# Patient Record
Sex: Male | Born: 1975 | Race: White | Hispanic: No | Smoking: Current every day smoker
Health system: Southern US, Community
[De-identification: ages and names within clinical notes are randomized; demographics above are authoritative.]

## PROBLEM LIST (undated history)

## (undated) VITALS — BP 128/89 | HR 77 | Temp 97.6°F | Resp 18 | Ht 72.0 in | Wt 194.0 lb

## (undated) DIAGNOSIS — B1920 Unspecified viral hepatitis C without hepatic coma: Secondary | ICD-10-CM

## (undated) DIAGNOSIS — R69 Illness, unspecified: Secondary | ICD-10-CM

## (undated) DIAGNOSIS — IMO0002 Reserved for concepts with insufficient information to code with codable children: Secondary | ICD-10-CM

## (undated) DIAGNOSIS — M549 Dorsalgia, unspecified: Secondary | ICD-10-CM

## (undated) DIAGNOSIS — M51379 Other intervertebral disc degeneration, lumbosacral region without mention of lumbar back pain or lower extremity pain: Secondary | ICD-10-CM

## (undated) DIAGNOSIS — I10 Essential (primary) hypertension: Secondary | ICD-10-CM

## (undated) DIAGNOSIS — K589 Irritable bowel syndrome without diarrhea: Secondary | ICD-10-CM

## (undated) DIAGNOSIS — G8929 Other chronic pain: Secondary | ICD-10-CM

## (undated) DIAGNOSIS — K5010 Crohn's disease of large intestine without complications: Secondary | ICD-10-CM

## (undated) DIAGNOSIS — F1910 Other psychoactive substance abuse, uncomplicated: Secondary | ICD-10-CM

## (undated) DIAGNOSIS — F419 Anxiety disorder, unspecified: Secondary | ICD-10-CM

## (undated) DIAGNOSIS — F32A Depression, unspecified: Secondary | ICD-10-CM

## (undated) DIAGNOSIS — F329 Major depressive disorder, single episode, unspecified: Secondary | ICD-10-CM

## (undated) DIAGNOSIS — M5137 Other intervertebral disc degeneration, lumbosacral region: Secondary | ICD-10-CM

## (undated) HISTORY — DX: Anxiety disorder, unspecified: F41.9

## (undated) HISTORY — PX: OTHER SURGICAL HISTORY: SHX169

## (undated) HISTORY — PX: FEMUR FRACTURE SURGERY: SHX633

## (undated) HISTORY — DX: Illness, unspecified: R69

## (undated) HISTORY — PX: ORIF PELVIC FRACTURE: SHX2128

## (undated) HISTORY — PX: KNEE SURGERY: SHX244

---

## 2009-02-03 ENCOUNTER — Emergency Department (HOSPITAL_COMMUNITY): Admission: EM | Admit: 2009-02-03 | Discharge: 2009-02-03 | Payer: Self-pay | Admitting: Emergency Medicine

## 2009-02-20 ENCOUNTER — Emergency Department (HOSPITAL_COMMUNITY): Admission: EM | Admit: 2009-02-20 | Discharge: 2009-02-20 | Payer: Self-pay | Admitting: Emergency Medicine

## 2009-03-25 ENCOUNTER — Emergency Department (HOSPITAL_COMMUNITY): Admission: EM | Admit: 2009-03-25 | Discharge: 2009-03-25 | Payer: Self-pay | Admitting: Emergency Medicine

## 2009-11-24 ENCOUNTER — Encounter
Admission: RE | Admit: 2009-11-24 | Discharge: 2009-11-24 | Payer: Self-pay | Admitting: Physical Medicine & Rehabilitation

## 2010-02-07 ENCOUNTER — Emergency Department (HOSPITAL_COMMUNITY): Admission: EM | Admit: 2010-02-07 | Discharge: 2010-02-07 | Payer: Self-pay | Admitting: Emergency Medicine

## 2010-02-14 ENCOUNTER — Emergency Department (HOSPITAL_COMMUNITY): Admission: EM | Admit: 2010-02-14 | Discharge: 2010-02-14 | Payer: Self-pay | Admitting: Emergency Medicine

## 2010-02-14 ENCOUNTER — Encounter: Payer: Self-pay | Admitting: Orthopedic Surgery

## 2010-02-19 ENCOUNTER — Ambulatory Visit: Payer: Self-pay | Admitting: Orthopedic Surgery

## 2010-02-19 DIAGNOSIS — S40019A Contusion of unspecified shoulder, initial encounter: Secondary | ICD-10-CM

## 2010-02-19 DIAGNOSIS — E119 Type 2 diabetes mellitus without complications: Secondary | ICD-10-CM

## 2010-02-19 DIAGNOSIS — B1710 Acute hepatitis C without hepatic coma: Secondary | ICD-10-CM | POA: Insufficient documentation

## 2010-02-19 DIAGNOSIS — IMO0002 Reserved for concepts with insufficient information to code with codable children: Secondary | ICD-10-CM | POA: Insufficient documentation

## 2010-03-21 ENCOUNTER — Encounter (HOSPITAL_COMMUNITY): Admission: RE | Admit: 2010-03-21 | Discharge: 2010-04-20 | Payer: Self-pay | Admitting: Orthopaedic Surgery

## 2010-03-30 ENCOUNTER — Ambulatory Visit (HOSPITAL_COMMUNITY): Admission: RE | Admit: 2010-03-30 | Discharge: 2010-03-30 | Payer: Self-pay | Admitting: Orthopaedic Surgery

## 2010-05-25 ENCOUNTER — Encounter (HOSPITAL_COMMUNITY)
Admission: RE | Admit: 2010-05-25 | Discharge: 2010-06-24 | Payer: Self-pay | Source: Home / Self Care | Admitting: Orthopaedic Surgery

## 2010-07-19 ENCOUNTER — Emergency Department (HOSPITAL_COMMUNITY)
Admission: EM | Admit: 2010-07-19 | Discharge: 2010-07-19 | Payer: Self-pay | Source: Home / Self Care | Admitting: Emergency Medicine

## 2010-07-31 ENCOUNTER — Encounter (HOSPITAL_COMMUNITY): Admission: RE | Admit: 2010-07-31 | Payer: Self-pay | Source: Home / Self Care | Admitting: Orthopaedic Surgery

## 2010-08-30 NOTE — Letter (Signed)
Summary: History form  History form   Imported By: Jacklynn Ganong 02/21/2010 12:15:18  _____________________________________________________________________  External Attachment:    Type:   Image     Comment:   External Document

## 2010-08-30 NOTE — Assessment & Plan Note (Signed)
Summary: [facility] 02/14/10 LEFT SHOULDER SPRAIN/XRE THERE/MEDICAID/BSF   Visit Type:  new patient Referring Provider:  [facility]  CC:  left shoulder pain.  History of Present Illness: I saw Matthew [name] in the office today for an initial visit.  He is a 35 years old man with the complaint of:  left shoulder pain.  This 35 year old male says he fell off a roof and landed on his LEFT shoulder and skinned his RIGHT knee.  He says he was injured on the 20th when he looked at the ER notes it was on the 19th.  He went to the emergency room the next day.  He has since gone to his primary care doctor for referral here because he is on Medicaid.  He was given a Demerol shot with some Phenergan and told to take 1-2 Percocet for pain he is in a sling.  He complained of sharp throbbing burning constant pain which is 7/10 and associated with the injury.  He says he can  he makes him wake up.  He has no bruising complains of tingling locking and swelling and abrasion over the RIGHT knee.  He says that it started to hurt quite a bit.    DOI 02-13-10.  [facility] on 02-14-10. Xrays were taken of his left shoulder. Normal.  Medications: Lantus, Humalog, Bentyl, Naprosyn 500mg , Norco 5mg .  Allergies (verified): No Known Drug Allergies  Past History:  Past Medical History: Diabetes Hepatitis C  Past Surgical History: Left knee Right knee  Review of Systems Constitutional:  Denies weight loss, weight gain, fever, chills, and fatigue. Cardiovascular:  Complains of chest pain; denies palpitations, fainting, and murmurs. Respiratory:  Denies short of breath, wheezing, couch, tightness, pain on inspiration, and snoring . Gastrointestinal:  Denies heartburn, nausea, vomiting, diarrhea, constipation, and blood in your stools. Genitourinary:  Denies frequency, urgency, difficulty urinating, painful urination, flank pain, and bleeding in urine. Neurologic:  Complains of tingling; denies numbness, unsteady gait,  dizziness, tremors, and seizure. Musculoskeletal:  Complains of joint pain, swelling, stiffness, and muscle pain; denies instability, redness, and heat. Endocrine:  Denies excessive thirst, exessive urination, and heat or cold intolerance. Psychiatric:  Denies nervousness, depression, anxiety, and hallucinations. Skin:  Denies changes in the skin, poor healing, rash, itching, and redness. HEENT:  Denies blurred or double vision, eye pain, redness, and watering. Immunology:  Denies seasonal allergies, sinus problems, and allergic to bee stings. Hemoatologic:  Denies easy bleeding and brusing.  Physical Exam  Msk:  The patient is well developed and nourished, with normal grooming and hygiene. The body habitus is  medium frame Pulses:  pulses are normal RIGHT leg LEFT arm Extremities:  RIGHT knee abrasion over the anterior part of the knee.  Range of motion is full no effusion normal muscle tone knee is stable  LEFT shoulder he complains of tenderness over the a.c. joint and over the anterolateral acromion.  There is no bruising ecchymosis or swelling around the shoulder.  His elbow wrist and hand have normal range of motion muscle tone and stability.  He has painful passive range of motion of the LEFT shoulder Neurologic:  normal sensation was noted he was awake alert and oriented x3 mood and affect were normal Skin:  operation RIGHT knee  LEFT shoulder normal Cervical Nodes:  no significant adenopathy Psych:  alert and cooperative; normal mood and affect; normal attention span and concentration   Impression & Recommendations:  Problem # 1:  CONTUSION OF SHOULDER REGION (ICD-923.00) Assessment  New  x-rays LEFT shoulder normal  Orders: New Patient Level III (04540)  Problem # 2:  ABRASION, KNEE, RIGHT (ICD-916.0) Assessment: New  Orders: New Patient Level III (98119)  Patient Instructions: 1)  Wear sling for 2 weeks  2)  take percocet until it runs out then take advil as  needed  3)  f/u as needed  4)  Diagnosis:  Contuison left shoulder xrays show no fracture

## 2010-08-30 NOTE — Letter (Signed)
Summary: *Orthopedic Consult Note  [facility]  [address] [place] [address]  [place], [place] [address]   Phone: [phone]  Fax: [phone]    Re:    Matthew [name] DOB:    [birth year]   Dear: Peyton Najjar   Thank you for requesting that we see the above patient for consultation.  A copy of the detailed office note will be sent under separate cover, for your review.  Evaluation today is consistent with:  1)  HEPATITIS C (ICD-070.51) 2)  DIABETES (ICD-250.00) 3) contusion LEFT shoulderand abrasion RIGHT knee   Our recommendation is for:   sling for 2 weeks, Percocet until it runs out he told me he gave him 60.  After that he can take over-the-counter medications.  We could not find a fracture I did not see any swelling bruising and ecchymosis around his LEFT shoulder.  No followup scheduled.       Thank you for this opportunity to look after your patient.  Sincerely,   Terrance Mass. MD.

## 2010-10-09 ENCOUNTER — Emergency Department (HOSPITAL_COMMUNITY): Payer: Medicaid Other

## 2010-10-09 ENCOUNTER — Emergency Department (HOSPITAL_COMMUNITY)
Admission: EM | Admit: 2010-10-09 | Discharge: 2010-10-09 | Disposition: A | Discharge status: Home / Self Care | Payer: Medicaid Other | Attending: Emergency Medicine | Admitting: Emergency Medicine

## 2010-10-09 DIAGNOSIS — S20219A Contusion of unspecified front wall of thorax, initial encounter: Secondary | ICD-10-CM | POA: Insufficient documentation

## 2010-10-09 DIAGNOSIS — M25569 Pain in unspecified knee: Secondary | ICD-10-CM | POA: Insufficient documentation

## 2010-10-09 DIAGNOSIS — Z79899 Other long term (current) drug therapy: Secondary | ICD-10-CM | POA: Insufficient documentation

## 2010-10-09 DIAGNOSIS — R51 Headache: Secondary | ICD-10-CM | POA: Insufficient documentation

## 2010-10-09 DIAGNOSIS — E119 Type 2 diabetes mellitus without complications: Secondary | ICD-10-CM | POA: Insufficient documentation

## 2010-10-09 DIAGNOSIS — R071 Chest pain on breathing: Secondary | ICD-10-CM | POA: Insufficient documentation

## 2010-10-09 DIAGNOSIS — W010XXA Fall on same level from slipping, tripping and stumbling without subsequent striking against object, initial encounter: Secondary | ICD-10-CM | POA: Insufficient documentation

## 2010-10-09 DIAGNOSIS — R0609 Other forms of dyspnea: Secondary | ICD-10-CM | POA: Insufficient documentation

## 2010-10-09 DIAGNOSIS — Y92009 Unspecified place in unspecified non-institutional (private) residence as the place of occurrence of the external cause: Secondary | ICD-10-CM | POA: Insufficient documentation

## 2010-10-09 DIAGNOSIS — I10 Essential (primary) hypertension: Secondary | ICD-10-CM | POA: Insufficient documentation

## 2010-10-09 DIAGNOSIS — M25469 Effusion, unspecified knee: Secondary | ICD-10-CM | POA: Insufficient documentation

## 2010-10-09 DIAGNOSIS — R0989 Other specified symptoms and signs involving the circulatory and respiratory systems: Secondary | ICD-10-CM | POA: Insufficient documentation

## 2010-10-09 DIAGNOSIS — S0990XA Unspecified injury of head, initial encounter: Secondary | ICD-10-CM | POA: Insufficient documentation

## 2010-10-09 DIAGNOSIS — Z794 Long term (current) use of insulin: Secondary | ICD-10-CM | POA: Insufficient documentation

## 2010-10-09 DIAGNOSIS — IMO0002 Reserved for concepts with insufficient information to code with codable children: Secondary | ICD-10-CM | POA: Insufficient documentation

## 2010-10-14 LAB — GLUCOSE, CAPILLARY

## 2010-10-17 ENCOUNTER — Emergency Department (HOSPITAL_COMMUNITY): Payer: Medicaid Other

## 2010-10-17 ENCOUNTER — Emergency Department (HOSPITAL_COMMUNITY)
Admission: EM | Admit: 2010-10-17 | Discharge: 2010-10-17 | Disposition: A | Discharge status: Home / Self Care | Payer: Medicaid Other | Attending: Emergency Medicine | Admitting: Emergency Medicine

## 2010-10-17 DIAGNOSIS — R071 Chest pain on breathing: Secondary | ICD-10-CM | POA: Insufficient documentation

## 2010-10-17 DIAGNOSIS — K589 Irritable bowel syndrome without diarrhea: Secondary | ICD-10-CM | POA: Insufficient documentation

## 2010-10-17 DIAGNOSIS — E119 Type 2 diabetes mellitus without complications: Secondary | ICD-10-CM | POA: Insufficient documentation

## 2010-10-17 DIAGNOSIS — I339 Acute and subacute endocarditis, unspecified: Secondary | ICD-10-CM | POA: Insufficient documentation

## 2010-10-17 LAB — CBC
Hemoglobin: 16.8 g/dL (ref 13.0–17.0)
MCV: 84.7 fL (ref 78.0–100.0)
Platelets: 139 10*3/uL — ABNORMAL LOW (ref 150–400)
RBC: 5.62 MIL/uL (ref 4.22–5.81)
WBC: 10.9 10*3/uL — ABNORMAL HIGH (ref 4.0–10.5)

## 2010-10-17 LAB — DIFFERENTIAL
Eosinophils Absolute: 0.1 10*3/uL (ref 0.0–0.7)
Lymphocytes Relative: 25 % (ref 12–46)
Lymphs Abs: 2.7 10*3/uL (ref 0.7–4.0)
Monocytes Relative: 6 % (ref 3–12)
Neutro Abs: 7.4 10*3/uL (ref 1.7–7.7)
Neutrophils Relative %: 68 % (ref 43–77)

## 2010-10-17 LAB — BASIC METABOLIC PANEL
BUN: 15 mg/dL (ref 6–23)
CO2: 28 mEq/L (ref 19–32)
Chloride: 102 mEq/L (ref 96–112)
Potassium: 3.5 mEq/L (ref 3.5–5.1)

## 2010-10-17 LAB — D-DIMER, QUANTITATIVE: D-Dimer, Quant: >20 ug/mL-FEU — ABNORMAL HIGH (ref 0.00–0.48)

## 2010-10-17 MED ORDER — IOHEXOL 350 MG/ML SOLN
100.0000 mL | Freq: Once | INTRAVENOUS | Status: AC | PRN
  Administered 2010-10-17: 100 mL via INTRAVENOUS

## 2010-11-03 LAB — URINALYSIS, ROUTINE W REFLEX MICROSCOPIC
Hgb urine dipstick: NEGATIVE
Nitrite: NEGATIVE
Specific Gravity, Urine: 1.045 — ABNORMAL HIGH (ref 1.005–1.030)
Urobilinogen, UA: 0.2 mg/dL (ref 0.0–1.0)
pH: 6.5 (ref 5.0–8.0)

## 2010-11-03 LAB — URINE MICROSCOPIC-ADD ON

## 2010-11-03 LAB — GLUCOSE, CAPILLARY: Glucose-Capillary: 423 mg/dL — ABNORMAL HIGH (ref 70–99)

## 2010-11-04 LAB — CBC
MCHC: 33.9 g/dL (ref 30.0–36.0)
MCV: 89.5 fL (ref 78.0–100.0)
Platelets: 167 10*3/uL (ref 150–400)
RDW: 13.5 % (ref 11.5–15.5)

## 2010-11-04 LAB — RAPID URINE DRUG SCREEN, HOSP PERFORMED
Barbiturates: NOT DETECTED
Opiates: POSITIVE — AB
Tetrahydrocannabinol: NOT DETECTED

## 2010-11-04 LAB — BASIC METABOLIC PANEL
BUN: 9 mg/dL (ref 6–23)
CO2: 31 mEq/L (ref 19–32)
Chloride: 102 mEq/L (ref 96–112)
Creatinine, Ser: 0.82 mg/dL (ref 0.4–1.5)

## 2010-11-04 LAB — DIFFERENTIAL
Basophils Relative: 1 % (ref 0–1)
Eosinophils Absolute: 0.1 10*3/uL (ref 0.0–0.7)
Neutrophils Relative %: 64 % (ref 43–77)

## 2010-11-23 ENCOUNTER — Other Ambulatory Visit (HOSPITAL_COMMUNITY): Payer: Self-pay | Admitting: Family Medicine

## 2010-11-23 ENCOUNTER — Encounter (HOSPITAL_COMMUNITY): Payer: Self-pay

## 2010-11-23 ENCOUNTER — Ambulatory Visit (HOSPITAL_COMMUNITY)
Admission: RE | Admit: 2010-11-23 | Discharge: 2010-11-23 | Disposition: A | Discharge status: Home / Self Care | Payer: Medicaid Other | Source: Ambulatory Visit | Attending: Family Medicine | Admitting: Family Medicine

## 2010-11-23 DIAGNOSIS — R079 Chest pain, unspecified: Secondary | ICD-10-CM

## 2010-11-23 DIAGNOSIS — R0789 Other chest pain: Secondary | ICD-10-CM | POA: Insufficient documentation

## 2010-11-23 HISTORY — DX: Essential (primary) hypertension: I10

## 2011-01-03 ENCOUNTER — Ambulatory Visit (HOSPITAL_COMMUNITY): Payer: Medicaid Other | Admitting: Psychology

## 2011-01-16 ENCOUNTER — Ambulatory Visit (INDEPENDENT_AMBULATORY_CARE_PROVIDER_SITE_OTHER): Payer: Medicaid Other | Admitting: Psychology

## 2011-01-16 DIAGNOSIS — F319 Bipolar disorder, unspecified: Secondary | ICD-10-CM

## 2011-01-16 DIAGNOSIS — F29 Unspecified psychosis not due to a substance or known physiological condition: Secondary | ICD-10-CM

## 2011-01-18 ENCOUNTER — Ambulatory Visit (INDEPENDENT_AMBULATORY_CARE_PROVIDER_SITE_OTHER): Payer: Medicaid Other | Admitting: Psychiatry

## 2011-01-18 DIAGNOSIS — F3289 Other specified depressive episodes: Secondary | ICD-10-CM

## 2011-01-23 ENCOUNTER — Encounter (INDEPENDENT_AMBULATORY_CARE_PROVIDER_SITE_OTHER): Payer: Medicaid Other | Admitting: Psychology

## 2011-01-23 DIAGNOSIS — F29 Unspecified psychosis not due to a substance or known physiological condition: Secondary | ICD-10-CM

## 2011-01-23 DIAGNOSIS — F319 Bipolar disorder, unspecified: Secondary | ICD-10-CM

## 2011-02-14 ENCOUNTER — Encounter (HOSPITAL_COMMUNITY): Payer: Medicaid Other | Admitting: Psychology

## 2011-02-15 ENCOUNTER — Encounter (HOSPITAL_COMMUNITY): Payer: Medicaid Other | Admitting: Psychiatry

## 2011-03-11 ENCOUNTER — Encounter (HOSPITAL_COMMUNITY): Payer: Medicaid Other | Admitting: Psychology

## 2011-05-10 ENCOUNTER — Encounter (INDEPENDENT_AMBULATORY_CARE_PROVIDER_SITE_OTHER): Payer: Medicaid Other | Admitting: Psychiatry

## 2011-05-10 DIAGNOSIS — F3289 Other specified depressive episodes: Secondary | ICD-10-CM

## 2011-05-17 ENCOUNTER — Encounter (HOSPITAL_COMMUNITY): Payer: Medicaid Other | Admitting: Psychology

## 2011-07-03 ENCOUNTER — Other Ambulatory Visit (HOSPITAL_COMMUNITY): Payer: Self-pay | Admitting: Internal Medicine

## 2011-07-03 DIAGNOSIS — M549 Dorsalgia, unspecified: Secondary | ICD-10-CM

## 2011-07-05 ENCOUNTER — Encounter (HOSPITAL_COMMUNITY): Payer: Medicaid Other | Admitting: Psychiatry

## 2011-07-05 ENCOUNTER — Ambulatory Visit (HOSPITAL_COMMUNITY)
Admission: RE | Admit: 2011-07-05 | Discharge: 2011-07-05 | Disposition: A | Discharge status: Home / Self Care | Payer: Medicaid Other | Source: Ambulatory Visit | Attending: Internal Medicine | Admitting: Internal Medicine

## 2011-07-05 ENCOUNTER — Ambulatory Visit (HOSPITAL_COMMUNITY): Payer: Medicaid Other | Admitting: Psychiatry

## 2011-07-05 DIAGNOSIS — M51379 Other intervertebral disc degeneration, lumbosacral region without mention of lumbar back pain or lower extremity pain: Secondary | ICD-10-CM | POA: Insufficient documentation

## 2011-07-05 DIAGNOSIS — M549 Dorsalgia, unspecified: Secondary | ICD-10-CM

## 2011-07-05 DIAGNOSIS — M545 Low back pain, unspecified: Secondary | ICD-10-CM | POA: Insufficient documentation

## 2011-07-05 DIAGNOSIS — M5137 Other intervertebral disc degeneration, lumbosacral region: Secondary | ICD-10-CM | POA: Insufficient documentation

## 2011-07-05 DIAGNOSIS — M5126 Other intervertebral disc displacement, lumbar region: Secondary | ICD-10-CM | POA: Insufficient documentation

## 2011-07-12 ENCOUNTER — Ambulatory Visit (HOSPITAL_COMMUNITY): Payer: Medicaid Other | Admitting: Psychology

## 2011-08-02 ENCOUNTER — Ambulatory Visit (INDEPENDENT_AMBULATORY_CARE_PROVIDER_SITE_OTHER): Payer: Medicaid Other | Admitting: Psychiatry

## 2011-08-02 ENCOUNTER — Encounter (HOSPITAL_COMMUNITY): Payer: Self-pay | Admitting: Psychiatry

## 2011-08-02 DIAGNOSIS — F3160 Bipolar disorder, current episode mixed, unspecified: Secondary | ICD-10-CM

## 2011-08-02 MED ORDER — ALPRAZOLAM 1 MG PO TABS: 1.0000 mg | ORAL_TABLET | Freq: Every evening | ORAL | Status: DC | PRN

## 2011-08-02 MED ORDER — QUETIAPINE FUMARATE ER 400 MG PO TB24: 400.0000 mg | ORAL_TABLET | Freq: Every day | ORAL | Status: DC

## 2011-08-02 NOTE — Progress Notes (Signed)
Patient ID: Matthew [name], male   DOB: 1975/12/09, 36 y.o.   MRN: [id number] Patient reports increasing anxiety and panic attacks that were treated in prison with remeron and xanax.  Not sleeping well.  Affect anxious.  Using tylox for back pain.  Diabetes controlled with occasional use of insulin.  Weight 20 lb. In last month.

## 2011-08-02 NOTE — Progress Notes (Signed)
Addended by: Toni Arthurs, Christoher Drudge L on: 08/02/2011 12:13 PM   Modules accepted: Orders

## 2011-08-15 ENCOUNTER — Ambulatory Visit (INDEPENDENT_AMBULATORY_CARE_PROVIDER_SITE_OTHER): Payer: Medicaid Other | Admitting: Psychology

## 2011-08-15 ENCOUNTER — Ambulatory Visit (HOSPITAL_COMMUNITY): Payer: Medicaid Other | Admitting: Psychology

## 2011-08-15 DIAGNOSIS — F3160 Bipolar disorder, current episode mixed, unspecified: Secondary | ICD-10-CM

## 2011-08-16 ENCOUNTER — Encounter (HOSPITAL_COMMUNITY): Payer: Self-pay | Admitting: Psychology

## 2011-08-16 ENCOUNTER — Other Ambulatory Visit (HOSPITAL_COMMUNITY): Payer: Self-pay | Admitting: *Deleted

## 2011-08-16 DIAGNOSIS — F3160 Bipolar disorder, current episode mixed, unspecified: Secondary | ICD-10-CM
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
## 2011-08-23 MED ORDER — QUETIAPINE FUMARATE ER 400 MG PO TB24: 400.0000 mg | ORAL_TABLET | Freq: Every day | ORAL | Status: DC

## 2011-08-30 ENCOUNTER — Ambulatory Visit (HOSPITAL_COMMUNITY): Payer: Medicaid Other | Admitting: Psychology

## 2011-09-13 ENCOUNTER — Encounter (HOSPITAL_COMMUNITY): Payer: Self-pay | Admitting: Psychiatry

## 2011-09-13 ENCOUNTER — Ambulatory Visit (INDEPENDENT_AMBULATORY_CARE_PROVIDER_SITE_OTHER): Payer: Medicaid Other | Admitting: Psychiatry

## 2011-09-13 ENCOUNTER — Ambulatory Visit (HOSPITAL_COMMUNITY): Payer: Medicaid Other | Admitting: Psychiatry

## 2011-09-13 DIAGNOSIS — F3160 Bipolar disorder, current episode mixed, unspecified: Secondary | ICD-10-CM

## 2011-09-13 MED ORDER — DIVALPROEX SODIUM 250 MG PO DR TAB: 250.0000 mg | DELAYED_RELEASE_TABLET | Freq: Three times a day (TID) | ORAL | Status: DC

## 2011-09-13 NOTE — Progress Notes (Signed)
Patient ID: Matthew [name], male   DOB: [birth year], 36 y.o.   MRN: [id number] Reports having panic attacks in crowds.  Did this in prison as well.  Staying up for days on end despite Seroquel 400.  Mind racing and more impulsive.  Goes outside to relieve anxiety and paranoia.  Some irritability.

## 2011-10-11 ENCOUNTER — Ambulatory Visit (HOSPITAL_COMMUNITY): Payer: Medicaid Other | Admitting: Psychiatry

## 2011-10-29 ENCOUNTER — Other Ambulatory Visit (HOSPITAL_COMMUNITY): Payer: Self-pay | Admitting: *Deleted

## 2011-10-29 DIAGNOSIS — F3160 Bipolar disorder, current episode mixed, unspecified: Secondary | ICD-10-CM

## 2011-11-08 ENCOUNTER — Telehealth (HOSPITAL_COMMUNITY): Payer: Self-pay | Admitting: *Deleted

## 2011-11-08 ENCOUNTER — Encounter (HOSPITAL_COMMUNITY): Payer: Self-pay | Admitting: Psychiatry

## 2011-11-08 ENCOUNTER — Ambulatory Visit (INDEPENDENT_AMBULATORY_CARE_PROVIDER_SITE_OTHER): Payer: Medicaid Other | Admitting: Psychiatry

## 2011-11-08 DIAGNOSIS — F3160 Bipolar disorder, current episode mixed, unspecified: Secondary | ICD-10-CM

## 2011-11-08 MED ORDER — ALPRAZOLAM 1 MG PO TABS: 1.0000 mg | ORAL_TABLET | Freq: Two times a day (BID) | ORAL | Status: DC
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
## 2011-12-06 ENCOUNTER — Ambulatory Visit (INDEPENDENT_AMBULATORY_CARE_PROVIDER_SITE_OTHER): Payer: Medicaid Other | Admitting: Psychiatry

## 2011-12-06 ENCOUNTER — Encounter (HOSPITAL_COMMUNITY): Payer: Self-pay | Admitting: Psychiatry

## 2011-12-06 DIAGNOSIS — F319 Bipolar disorder, unspecified: Secondary | ICD-10-CM

## 2011-12-06 NOTE — Progress Notes (Signed)
Patient ID: Matthew [name], male   DOB: [birth year], 36 y.o.   MRN: [id number] Same stuff.  Still dealing with loss of mother and girlfriend.  Has a new pit bull puppy that helps keep me busy.  Still down and depressed when alone.  Able to tolerate this all better.  Temper better as in contrast to prison persona where he was anxious and jittery.  Fought a lot there.  Handles temper better now.  Not arrested in 3 years since release.  Doing yard work and stays by himself.

## 2011-12-31 ENCOUNTER — Encounter (HOSPITAL_COMMUNITY): Payer: Self-pay

## 2011-12-31 ENCOUNTER — Telehealth (HOSPITAL_COMMUNITY): Payer: Self-pay | Admitting: *Deleted

## 2011-12-31 ENCOUNTER — Emergency Department (HOSPITAL_COMMUNITY)
Admission: EM | Admit: 2011-12-31 | Discharge: 2011-12-31 | Disposition: A | Discharge status: Home / Self Care | Payer: Medicaid Other | Attending: Emergency Medicine | Admitting: Emergency Medicine

## 2011-12-31 DIAGNOSIS — S51809A Unspecified open wound of unspecified forearm, initial encounter: Secondary | ICD-10-CM | POA: Insufficient documentation

## 2011-12-31 DIAGNOSIS — X789XXA Intentional self-harm by unspecified sharp object, initial encounter: Secondary | ICD-10-CM | POA: Insufficient documentation

## 2011-12-31 DIAGNOSIS — Y92009 Unspecified place in unspecified non-institutional (private) residence as the place of occurrence of the external cause: Secondary | ICD-10-CM | POA: Insufficient documentation

## 2011-12-31 DIAGNOSIS — F17200 Nicotine dependence, unspecified, uncomplicated: Secondary | ICD-10-CM | POA: Insufficient documentation

## 2011-12-31 DIAGNOSIS — I10 Essential (primary) hypertension: Secondary | ICD-10-CM | POA: Insufficient documentation

## 2011-12-31 DIAGNOSIS — E119 Type 2 diabetes mellitus without complications: Secondary | ICD-10-CM | POA: Insufficient documentation

## 2011-12-31 NOTE — Discharge Instructions (Signed)
The steri strips will peel off on their own. If you continue to feel anxious and dpressed, follow up with Len Blalock and Dr. Kieth Brightly. Laceration Care, Adult A laceration is a cut or lesion that goes through all layers of the skin and into the tissue just beneath the skin. TREATMENT  Some lacerations may not require closure. Some lacerations may not be able to be closed due to an increased risk of infection. It is important to see your caregiver as soon as possible after an injury to minimize the risk of infection and maximize the opportunity for successful closure. If closure is appropriate, pain medicines may be given, if needed. The wound will be cleaned to help prevent infection. Your caregiver will use stitches (sutures), staples, wound glue (adhesive), or skin adhesive strips to repair the laceration. These tools bring the skin edges together to allow for faster healing and a better cosmetic outcome. However, all wounds will heal with a scar. Once the wound has healed, scarring can be minimized by covering the wound with sunscreen during the day for 1 full year. HOME CARE INSTRUCTIONS  For sutures or staples:  Keep the wound clean and dry.   If you were given a bandage (dressing), you should change it at least once a day. Also, change the dressing if it becomes wet or dirty, or as directed by your caregiver.   Wash the wound with soap and water 2 times a day. Rinse the wound off with water to remove all soap. Pat the wound dry with a clean towel.   After cleaning, apply a thin layer of the antibiotic ointment as recommended by your caregiver. This will help prevent infection and keep the dressing from sticking.   You may shower as usual after the first 24 hours. Do not soak the wound in water until the sutures are removed.   Only take over-the-counter or prescription medicines for pain, discomfort, or fever as directed by your caregiver.   Get your sutures or staples removed as  directed by your caregiver.  For skin adhesive strips:  Keep the wound clean and dry.   Do not get the skin adhesive strips wet. You may bathe carefully, using caution to keep the wound dry.   If the wound gets wet, pat it dry with a clean towel.   Skin adhesive strips will fall off on their own. You may trim the strips as the wound heals. Do not remove skin adhesive strips that are still stuck to the wound. They will fall off in time.  For wound adhesive:  You may briefly wet your wound in the shower or bath. Do not soak or scrub the wound. Do not swim. Avoid periods of heavy perspiration until the skin adhesive has fallen off on its own. After showering or bathing, gently pat the wound dry with a clean towel.   Do not apply liquid medicine, cream medicine, or ointment medicine to your wound while the skin adhesive is in place. This may loosen the film before your wound is healed.   If a dressing is placed over the wound, be careful not to apply tape directly over the skin adhesive. This may cause the adhesive to be pulled off before the wound is healed.   Avoid prolonged exposure to sunlight or tanning lamps while the skin adhesive is in place. Exposure to ultraviolet light in the first year will darken the scar.   The skin adhesive will usually remain in place for 5 to  10 days, then naturally fall off the skin. Do not pick at the adhesive film.  You may need a tetanus shot if:  You cannot remember when you had your last tetanus shot.   You have never had a tetanus shot.  If you get a tetanus shot, your arm may swell, get red, and feel warm to the touch. This is common and not a problem. If you need a tetanus shot and you choose not to have one, there is a rare chance of getting tetanus. Sickness from tetanus can be serious. SEEK MEDICAL CARE IF:   You have redness, swelling, or increasing pain in the wound.   You see a red line that goes away from the wound.   You have  yellowish-white fluid (pus) coming from the wound.   You have a fever.   You notice a bad smell coming from the wound or dressing.   Your wound breaks open before or after sutures have been removed.   You notice something coming out of the wound such as wood or glass.   Your wound is on your hand or foot and you cannot move a finger or toe.  SEEK IMMEDIATE MEDICAL CARE IF:   Your pain is not controlled with prescribed medicine.   You have severe swelling around the wound causing pain and numbness or a change in color in your arm, hand, leg, or foot.   Your wound splits open and starts bleeding.   You have worsening numbness, weakness, or loss of function of any joint around or beyond the wound.   You develop painful lumps near the wound or on the skin anywhere on your body.  MAKE SURE YOU:   Understand these instructions.   Will watch your condition.   Will get help right away if you are not doing well or get worse.  Document Released: 07/15/2005 Document Revised: 07/04/2011 Document Reviewed: 01/08/2011 [facility] Patient Information 2012 [place], [place].

## 2011-12-31 NOTE — ED Notes (Signed)
Was working on something and cut my leg forearm with a knife per pt. Happened about 1 hour ago per pt. Bleeding controlled at present time. Patient has 3 marks on his forearm that he states came from an old 3 blade knife while cutting carbon.
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
## 2012-01-17 ENCOUNTER — Ambulatory Visit (HOSPITAL_COMMUNITY): Payer: Self-pay | Admitting: Psychiatry

## 2012-01-31 ENCOUNTER — Ambulatory Visit (INDEPENDENT_AMBULATORY_CARE_PROVIDER_SITE_OTHER): Payer: Medicaid Other | Admitting: Psychiatry

## 2012-01-31 DIAGNOSIS — F319 Bipolar disorder, unspecified: Secondary | ICD-10-CM

## 2012-01-31 NOTE — Progress Notes (Signed)
Patient ID: Matthew [name], male   DOB: 21-Feb-1976, 36 y.o.   MRN: [id number] Called after feeling suicidal and trying to get himself admitted through er, but was refused admission.  Recommended he go to hospital directly, but he did not go; said he could not get a ride there.  Not suicidal now but still having anxiety attacks.  Feels more depressed now.

## 2012-02-06 ENCOUNTER — Other Ambulatory Visit (HOSPITAL_COMMUNITY): Payer: Self-pay | Admitting: *Deleted

## 2012-02-06 DIAGNOSIS — F3160 Bipolar disorder, current episode mixed, unspecified: Secondary | ICD-10-CM

## 2012-02-13 ENCOUNTER — Other Ambulatory Visit (HOSPITAL_COMMUNITY): Payer: Self-pay | Admitting: Internal Medicine

## 2012-02-13 DIAGNOSIS — R7402 Elevation of levels of lactic acid dehydrogenase (LDH): Secondary | ICD-10-CM

## 2012-02-25 ENCOUNTER — Other Ambulatory Visit (HOSPITAL_COMMUNITY): Payer: Self-pay | Admitting: *Deleted

## 2012-02-25 DIAGNOSIS — F3160 Bipolar disorder, current episode mixed, unspecified: Secondary | ICD-10-CM

## 2012-02-28 ENCOUNTER — Ambulatory Visit (HOSPITAL_COMMUNITY)
Admission: RE | Admit: 2012-02-28 | Discharge: 2012-02-28 | Disposition: A | Discharge status: Home / Self Care | Payer: Medicaid Other | Source: Ambulatory Visit | Attending: Internal Medicine | Admitting: Internal Medicine

## 2012-02-28 ENCOUNTER — Ambulatory Visit (HOSPITAL_COMMUNITY): Payer: Medicaid Other

## 2012-02-28 DIAGNOSIS — I10 Essential (primary) hypertension: Secondary | ICD-10-CM | POA: Insufficient documentation

## 2012-02-28 DIAGNOSIS — R7401 Elevation of levels of liver transaminase levels: Secondary | ICD-10-CM | POA: Insufficient documentation

## 2012-02-28 DIAGNOSIS — E119 Type 2 diabetes mellitus without complications: Secondary | ICD-10-CM | POA: Insufficient documentation

## 2012-02-28 DIAGNOSIS — R7402 Elevation of levels of lactic acid dehydrogenase (LDH): Secondary | ICD-10-CM | POA: Insufficient documentation

## 2012-02-28 MED ORDER — ALPRAZOLAM 1 MG PO TABS: 1.0000 mg | ORAL_TABLET | Freq: Three times a day (TID) | ORAL | Status: DC

## 2012-02-28 MED ORDER — DIVALPROEX SODIUM 250 MG PO DR TAB: 250.0000 mg | DELAYED_RELEASE_TABLET | Freq: Three times a day (TID) | ORAL | Status: DC

## 2012-03-05 ENCOUNTER — Other Ambulatory Visit (HOSPITAL_COMMUNITY): Payer: Self-pay | Admitting: Internal Medicine

## 2012-03-05 DIAGNOSIS — R1013 Epigastric pain: Secondary | ICD-10-CM

## 2012-03-09 ENCOUNTER — Encounter (HOSPITAL_COMMUNITY): Payer: Self-pay

## 2012-03-09 ENCOUNTER — Encounter (HOSPITAL_COMMUNITY)
Admission: RE | Admit: 2012-03-09 | Discharge: 2012-03-09 | Disposition: A | Discharge status: Home / Self Care | Payer: Medicaid Other | Source: Ambulatory Visit | Attending: Internal Medicine | Admitting: Internal Medicine

## 2012-03-09 DIAGNOSIS — R109 Unspecified abdominal pain: Secondary | ICD-10-CM | POA: Insufficient documentation

## 2012-03-09 DIAGNOSIS — R1013 Epigastric pain: Secondary | ICD-10-CM

## 2012-03-09 MED ORDER — TECHNETIUM TC 99M MEBROFENIN IV KIT
5.0000 | PACK | Freq: Once | INTRAVENOUS | Status: AC | PRN
  Administered 2012-03-09: 5 via INTRAVENOUS

## 2012-03-13 ENCOUNTER — Ambulatory Visit (INDEPENDENT_AMBULATORY_CARE_PROVIDER_SITE_OTHER): Payer: Medicaid Other | Admitting: Psychiatry

## 2012-03-13 ENCOUNTER — Encounter (INDEPENDENT_AMBULATORY_CARE_PROVIDER_SITE_OTHER): Payer: Self-pay | Admitting: *Deleted

## 2012-03-13 ENCOUNTER — Encounter (HOSPITAL_COMMUNITY): Payer: Self-pay | Admitting: Psychiatry

## 2012-03-13 DIAGNOSIS — F319 Bipolar disorder, unspecified: Secondary | ICD-10-CM

## 2012-03-13 NOTE — Progress Notes (Signed)
Patient ID: Matthew [name], male   DOB: [birth year], 36 y.o.   MRN: [id number] Staying on meds regularly.  Mood about same.  Says he is still depressed but not as many panic attacks.  Not started zoloft yet because of money.  Now working some to paint some rooms.

## 2012-04-01 ENCOUNTER — Ambulatory Visit (INDEPENDENT_AMBULATORY_CARE_PROVIDER_SITE_OTHER): Payer: Self-pay | Admitting: Internal Medicine

## 2012-04-21 ENCOUNTER — Inpatient Hospital Stay (HOSPITAL_COMMUNITY)
Admission: RE | Admit: 2012-04-21 | Discharge: 2012-04-27 | DRG: 897 | Disposition: A | Discharge status: Home / Self Care | Payer: 59 | Attending: Psychiatry | Admitting: Psychiatry

## 2012-04-21 ENCOUNTER — Encounter (HOSPITAL_COMMUNITY): Payer: Self-pay | Admitting: *Deleted

## 2012-04-21 DIAGNOSIS — F1920 Other psychoactive substance dependence, uncomplicated: Secondary | ICD-10-CM | POA: Diagnosis present

## 2012-04-21 DIAGNOSIS — F17200 Nicotine dependence, unspecified, uncomplicated: Secondary | ICD-10-CM | POA: Diagnosis present

## 2012-04-21 DIAGNOSIS — F3160 Bipolar disorder, current episode mixed, unspecified: Secondary | ICD-10-CM

## 2012-04-21 DIAGNOSIS — E119 Type 2 diabetes mellitus without complications: Secondary | ICD-10-CM | POA: Diagnosis present

## 2012-04-21 DIAGNOSIS — Y249XXA Unspecified firearm discharge, undetermined intent, initial encounter: Secondary | ICD-10-CM | POA: Insufficient documentation

## 2012-04-21 DIAGNOSIS — IMO0002 Reserved for concepts with insufficient information to code with codable children: Secondary | ICD-10-CM

## 2012-04-21 DIAGNOSIS — I10 Essential (primary) hypertension: Secondary | ICD-10-CM | POA: Diagnosis present

## 2012-04-21 DIAGNOSIS — F1994 Other psychoactive substance use, unspecified with psychoactive substance-induced mood disorder: Principal | ICD-10-CM | POA: Diagnosis present

## 2012-04-21 DIAGNOSIS — F4323 Adjustment disorder with mixed anxiety and depressed mood: Secondary | ICD-10-CM | POA: Diagnosis present

## 2012-04-21 DIAGNOSIS — F39 Unspecified mood [affective] disorder: Secondary | ICD-10-CM | POA: Diagnosis present

## 2012-04-21 DIAGNOSIS — Z79899 Other long term (current) drug therapy: Secondary | ICD-10-CM

## 2012-04-21 DIAGNOSIS — Z7289 Other problems related to lifestyle: Secondary | ICD-10-CM | POA: Diagnosis not present

## 2012-04-21 DIAGNOSIS — F339 Major depressive disorder, recurrent, unspecified: Secondary | ICD-10-CM | POA: Diagnosis present

## 2012-04-21 HISTORY — DX: Major depressive disorder, single episode, unspecified: F32.9

## 2012-04-21 HISTORY — DX: Reserved for concepts with insufficient information to code with codable children: IMO0002

## 2012-04-21 HISTORY — DX: Depression, unspecified: F32.A

## 2012-04-21 LAB — COMPREHENSIVE METABOLIC PANEL
ALT: 19 U/L (ref 0–53)
BUN: 11 mg/dL (ref 6–23)
CO2: 35 mEq/L — ABNORMAL HIGH (ref 19–32)
Calcium: 9.9 mg/dL (ref 8.4–10.5)
GFR calc Af Amer: >90 mL/min (ref >90)
GFR calc non Af Amer: 80 mL/min — ABNORMAL LOW (ref >90)
Glucose, Bld: 281 mg/dL — ABNORMAL HIGH (ref 70–99)
Sodium: 136 mEq/L (ref 135–145)
Total Protein: 7.5 g/dL (ref 6.0–8.3)

## 2012-04-21 LAB — URINALYSIS, ROUTINE W REFLEX MICROSCOPIC
Bilirubin Urine: NEGATIVE
Glucose, UA: NEGATIVE mg/dL
Ketones, ur: NEGATIVE mg/dL
Protein, ur: NEGATIVE mg/dL
Urobilinogen, UA: 0.2 mg/dL (ref 0.0–1.0)

## 2012-04-21 LAB — CBC WITH DIFFERENTIAL/PLATELET
Basophils Absolute: 0.1 10*3/uL (ref 0.0–0.1)
Basophils Relative: 1 % (ref 0–1)
Hemoglobin: 16.6 g/dL (ref 13.0–17.0)
Lymphocytes Relative: 43 % (ref 12–46)
MCHC: 34.9 g/dL (ref 30.0–36.0)
Neutro Abs: 3.9 10*3/uL (ref 1.7–7.7)
Neutrophils Relative %: 45 % (ref 43–77)
RDW: 13 % (ref 11.5–15.5)
WBC: 8.8 10*3/uL (ref 4.0–10.5)

## 2012-04-21 MED ORDER — ACETAMINOPHEN 325 MG PO TABS: 650.0000 mg | ORAL_TABLET | Freq: Four times a day (QID) | ORAL | Status: DC | PRN

## 2012-04-21 MED ORDER — QUETIAPINE FUMARATE ER 400 MG PO TB24
400.0000 mg | ORAL_TABLET | Freq: Every day | ORAL | Status: DC
  Administered 2012-04-21: 400 mg via ORAL
  Filled 2012-04-21 (×3): qty 1

## 2012-04-21 MED ORDER — DICYCLOMINE HCL 20 MG PO TABS
20.0000 mg | ORAL_TABLET | Freq: Four times a day (QID) | ORAL | Status: DC
  Administered 2012-04-21 – 2012-04-27 (×19): 20 mg via ORAL
  Filled 2012-04-21 (×34): qty 1

## 2012-04-21 MED ORDER — METFORMIN HCL 500 MG PO TABS
500.0000 mg | ORAL_TABLET | Freq: Two times a day (BID) | ORAL | Status: DC
  Administered 2012-04-22 – 2012-04-27 (×11): 500 mg via ORAL
  Filled 2012-04-21: qty 28
  Filled 2012-04-21: qty 1
  Filled 2012-04-21: qty 28
  Filled 2012-04-21 (×13): qty 1

## 2012-04-21 MED ORDER — LISINOPRIL 20 MG PO TABS
20.0000 mg | ORAL_TABLET | ORAL | Status: DC
  Administered 2012-04-21 – 2012-04-27 (×7): 20 mg via ORAL
  Filled 2012-04-21: qty 1
  Filled 2012-04-21: qty 14
  Filled 2012-04-21 (×9): qty 1

## 2012-04-21 MED ORDER — ALUM & MAG HYDROXIDE-SIMETH 200-200-20 MG/5ML PO SUSP: 30.0000 mL | ORAL | Status: DC | PRN

## 2012-04-21 MED ORDER — MAGNESIUM HYDROXIDE 400 MG/5ML PO SUSP: 30.0000 mL | Freq: Every day | ORAL | Status: DC | PRN

## 2012-04-21 MED ORDER — DIVALPROEX SODIUM 250 MG PO DR TAB
250.0000 mg | DELAYED_RELEASE_TABLET | Freq: Three times a day (TID) | ORAL | Status: DC
  Administered 2012-04-21 – 2012-04-22 (×2): 250 mg via ORAL
  Filled 2012-04-21 (×7): qty 1

## 2012-04-21 MED ORDER — NICOTINE 21 MG/24HR TD PT24
21.0000 mg | MEDICATED_PATCH | Freq: Every day | TRANSDERMAL | Status: DC
  Administered 2012-04-21 – 2012-04-27 (×6): 21 mg via TRANSDERMAL
  Filled 2012-04-21 (×12): qty 1

## 2012-04-21 NOTE — Progress Notes (Signed)
Pt resting quietly with eyes closed. Respirations even and unlabored. No distress noted. Q 15 minute check continues to maintain safety. 
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
## 2012-04-21 NOTE — Progress Notes (Signed)
Patient admitted to [facility] due to suicidal ideation and increasing depression.  He has some suicidal ideation and thoughts of cutting his wrists.  He contracts for safety.  He states that he cut his left wrist about a month ago.  He has a history of a suicide attempt in his teens and was hospitalized at that time.  He denies AVH and HI at this time.
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
## 2012-04-21 NOTE — Progress Notes (Signed)
Psychoeducational Group Note  Date:  04/21/2012 Time: 2000  Group Topic/Focus:  AA meeting  Participation Level:  Active  Participation Quality:  Patient did attend AA meeting tonight.  Affect:    Cognitive:    Insight:    Engagement in Group:    Additional Comments:  Patient did attend AA meeting tonight.  Matthew [name], Newton Pigg 04/21/2012, 10:33 PM

## 2012-04-22 DIAGNOSIS — F1920 Other psychoactive substance dependence, uncomplicated: Secondary | ICD-10-CM | POA: Diagnosis present

## 2012-04-22 LAB — URINE DRUGS OF ABUSE SCREEN W ALC, ROUTINE (REF LAB)
Benzodiazepines.: NEGATIVE
Cocaine Metabolites: NEGATIVE
Methadone: NEGATIVE
Phencyclidine (PCP): NEGATIVE
Propoxyphene: NEGATIVE

## 2012-04-22 LAB — HEMOGLOBIN A1C: Hgb A1c MFr Bld: 7.5 % — ABNORMAL HIGH (ref <5.7)

## 2012-04-22 LAB — T4, FREE: Free T4: 1.14 ng/dL (ref 0.80–1.80)

## 2012-04-22 LAB — GLUCOSE, CAPILLARY: Glucose-Capillary: 232 mg/dL — ABNORMAL HIGH (ref 70–99)

## 2012-04-22 LAB — T3, FREE: T3, Free: 3.3 pg/mL (ref 2.3–4.2)

## 2012-04-22 MED ORDER — HYDROXYZINE HCL 25 MG PO TABS
25.0000 mg | ORAL_TABLET | Freq: Four times a day (QID) | ORAL | Status: AC | PRN
  Administered 2012-04-24: 25 mg via ORAL

## 2012-04-22 MED ORDER — ONDANSETRON 4 MG PO TBDP
4.0000 mg | ORAL_TABLET | Freq: Four times a day (QID) | ORAL | Status: AC | PRN
  Administered 2012-04-25: 4 mg via ORAL

## 2012-04-22 MED ORDER — LOPERAMIDE HCL 2 MG PO CAPS: 2.0000 mg | ORAL_CAPSULE | ORAL | Status: AC | PRN

## 2012-04-22 MED ORDER — AMITRIPTYLINE HCL 25 MG PO TABS
25.0000 mg | ORAL_TABLET | Freq: Every day | ORAL | Status: DC
  Administered 2012-04-22: 25 mg via ORAL
  Filled 2012-04-22 (×3): qty 1

## 2012-04-22 MED ORDER — QUETIAPINE FUMARATE 200 MG PO TABS
200.0000 mg | ORAL_TABLET | Freq: Every day | ORAL | Status: DC
  Administered 2012-04-22: 200 mg via ORAL
  Filled 2012-04-22 (×3): qty 1

## 2012-04-22 MED ORDER — CARBAMAZEPINE 200 MG PO TABS
200.0000 mg | ORAL_TABLET | Freq: Three times a day (TID) | ORAL | Status: DC
  Administered 2012-04-22 – 2012-04-23 (×4): 200 mg via ORAL
  Filled 2012-04-22 (×13): qty 1

## 2012-04-22 MED ORDER — MELOXICAM 7.5 MG PO TABS
7.5000 mg | ORAL_TABLET | Freq: Every day | ORAL | Status: DC
  Administered 2012-04-23: 7.5 mg via ORAL
  Filled 2012-04-22 (×3): qty 1

## 2012-04-22 MED ORDER — CHLORDIAZEPOXIDE HCL 25 MG PO CAPS
25.0000 mg | ORAL_CAPSULE | Freq: Four times a day (QID) | ORAL | Status: AC | PRN
  Administered 2012-04-22 – 2012-04-25 (×2): 25 mg via ORAL
  Filled 2012-04-22 (×2): qty 1

## 2012-04-22 MED ORDER — DICLOFENAC SODIUM 1 % TD GEL
2.0000 g | Freq: Four times a day (QID) | TRANSDERMAL | Status: AC
  Administered 2012-04-22 (×3): 2 g via TOPICAL
  Filled 2012-04-22: qty 100

## 2012-04-22 MED ORDER — DICYCLOMINE HCL 20 MG PO TABS: 20.0000 mg | ORAL_TABLET | Freq: Four times a day (QID) | ORAL | Status: AC | PRN

## 2012-04-22 MED ORDER — LIDOCAINE 5 % EX PTCH
1.0000 | MEDICATED_PATCH | CUTANEOUS | Status: DC
  Administered 2012-04-23: 1 via TRANSDERMAL
  Filled 2012-04-22 (×3): qty 1

## 2012-04-22 MED ORDER — METHOCARBAMOL 500 MG PO TABS
500.0000 mg | ORAL_TABLET | Freq: Three times a day (TID) | ORAL | Status: DC | PRN
  Administered 2012-04-22 – 2012-04-23 (×3): 500 mg via ORAL
  Filled 2012-04-22 (×2): qty 1

## 2012-04-22 MED ORDER — NAPROXEN 500 MG PO TABS
500.0000 mg | ORAL_TABLET | Freq: Two times a day (BID) | ORAL | Status: AC | PRN
  Administered 2012-04-22: 500 mg via ORAL
  Filled 2012-04-22: qty 1

## 2012-04-22 MED ORDER — CLONIDINE HCL 0.2 MG/24HR TD PTWK
0.2000 mg | MEDICATED_PATCH | TRANSDERMAL | Status: DC
  Administered 2012-04-22: 0.2 mg via TRANSDERMAL
  Filled 2012-04-22: qty 1

## 2012-04-22 NOTE — Progress Notes (Signed)
Psychoeducational Group Note  Date:  04/22/2012 Time:  2000  Group Topic/Focus:  Wrap-Up Group:   The focus of this group is to help patients review their daily goal of treatment and discuss progress on daily workbooks.  Participation Level: Did Not Attend  Participation Quality:  Not Applicable  Affect:  Not Applicable  Cognitive:  Not Applicable  Insight:  Not Applicable  Engagement in Group: Not Applicable  Additional Comments:    Flonnie Hailstone 04/22/2012, 9:31 PM

## 2012-04-22 NOTE — Progress Notes (Signed)
04/22/2012      Time: 1500      Group Topic/Focus: The focus of this group is on discussing various styles of communication and communicating assertively using 'I' (feeling) statements.  Participation Level: Did not attend  Participation Quality: Not Applicable  Affect: Not Applicable  Cognitive: Not Applicable   Additional Comments: None   Matthew [name] 04/22/2012 3:55 PM

## 2012-04-22 NOTE — Progress Notes (Signed)
D) Patient quiet and resting in bed upon my assessment. Patient states slept "fair", appetite is "improving" today. Patient endorses passive SI, contracts verbally for safety with RN. Patient denies HI, denies A/V hallucinations at this time.   A) Patient offered support and encouragement. Patient seen by treatment team and MD to evaluate care plan and medications. Patient verbalized understanding of care plan. Patient remains safe on unit with Q15 minute checks for safety.   R) Patient engaging with peers on unit. Patient attending meals in dining room. Patient not attending groups today because he is too sedated r/t sleep medications last night, patient would like to attend groups once he is feeling more awake. Patient verbalizes no questions/concerns at this time. Will continue to monitor.

## 2012-04-22 NOTE — Progress Notes (Signed)
Psychoeducational Group Note  Date:  04/22/2012 Time:  1100  Group Topic/Focus:  Rediscovering Joy:   The focus of this group is to explore various ways to relieve stress in a positive manner.  Participation Level: Did Not Attend  Participation Quality:  Not Applicable  Affect:  Not Applicable  Cognitive:  Not Applicable  Insight:  Not Applicable  Engagement in Group: Not Applicable  Additional Comments:  Pt remained in bed, did not attend group.  Karleen Hampshire Brittini 04/22/2012, 1:45 PM \

## 2012-04-22 NOTE — Progress Notes (Signed)
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 Hypertension   . Diabetes mellitus   . Degenerative disc disease   . Depression     bipolar   Axis IV: other psychosocial or environmental problems Axis V: 21-30 behavior considerably influenced by delusions or hallucinations OR serious impairment in judgment, communication OR inability to function in almost all areas  ADL's:  Intact  Sleep: Fair  Appetite:  Fair  Suicidal Ideation:  Pt denies any thoughts, plans, intent of suicide Homicidal Ideation:  Pt denies any thoughts, plans, intent of homicide  AEB (as evidenced by):per pt report  Mental Status Examination/Evaluation: Objective:  Appearance: Casual  Eye Contact::  Good  Speech:  Clear and Coherent  Volume:  Normal  Mood:  Anxious, Depressed and Irritable  Affect:  Congruent  Thought Process:  Coherent  Orientation:  Full  Thought Content:  WDL  Suicidal Thoughts:  No  Homicidal Thoughts:  No  Memory:  Immediate;   Fair Recent;   Fair Remote;   Fair  Judgement:  Impaired  Insight:  Lacking  Psychomotor Activity:  Normal  Concentration:  Fair  Recall:  Fair  Akathisia:  No  Handed:  Right  AIMS (if indicated):     Assets:  Communication Skills Desire for Improvement  Sleep:  Number of Hours: 6.75    Vital Signs:Blood pressure 141/97, pulse 106, temperature 97.8 F (36.6 C), temperature source Oral, resp. rate 18, height 6' (1.829 m), weight 87.998 kg (194 lb), SpO2 98.00%. Current Medications: Current Facility-Administered Medications  Medication Dose Route Frequency Provider Last Rate Last Dose  . acetaminophen (TYLENOL) tablet 650 mg  650 mg Oral Q6H PRN Curlene Labrum Readling, MD      . alum & mag hydroxide-simeth (MAALOX/MYLANTA) 200-200-20 MG/5ML suspension 30 mL  30 mL Oral Q4H PRN Curlene Labrum  Readling, MD      . amitriptyline (ELAVIL) tablet 25 mg  25 mg Oral QHS Mike Craze, MD      . carbamazepine (TEGRETOL) tablet 200 mg  200 mg Oral TID Mike Craze, MD   200 mg at 04/22/12 1713  . chlordiazePOXIDE (LIBRIUM) capsule 25 mg  25 mg Oral Q6H PRN Mike Craze, MD      . cloNIDine (CATAPRES - Dosed in mg/24 hr) patch 0.2 mg  0.2 mg Transdermal NOW Mike Craze, MD   0.2 mg at 04/22/12 1259  . diclofenac sodium (VOLTAREN) 1 % transdermal gel 2 g  2 g Topical QID Mike Craze, MD   2 g at 04/22/12 1714   Followed by  . lidocaine (LIDODERM) 5 % 1 patch  1 patch Transdermal Q24H Mike Craze, MD      . dicyclomine (BENTYL) tablet 20 mg  20 mg Oral Q6H Curlene Labrum Readling, MD   20 mg at 04/22/12 1259  . dicyclomine (BENTYL) tablet 20 mg  20 mg Oral Q6H PRN Mike Craze, MD      . hydrOXYzine (ATARAX/VISTARIL) tablet 25 mg  25 mg Oral Q6H PRN Mike Craze, MD      . lisinopril (PRINIVIL,ZESTRIL) tablet 20 mg  20 mg Oral BH-q7a Curlene Labrum Readling, MD   20 mg at 04/22/12 0650  . loperamide (IMODIUM) capsule 2-4 mg  2-4 mg Oral PRN Mike Craze, MD      .  magnesium hydroxide (MILK OF MAGNESIA) suspension 30 mL  30 mL Oral Daily PRN Curlene Labrum Readling, MD      . meloxicam (MOBIC) tablet 7.5 mg  7.5 mg Oral Daily Mike Craze, MD      . metFORMIN (GLUCOPHAGE) tablet 500 mg  500 mg Oral BID WC Curlene Labrum Readling, MD   500 mg at 04/22/12 1713  . methocarbamol (ROBAXIN) tablet 500 mg  500 mg Oral Q8H PRN Mike Craze, MD   500 mg at 04/22/12 1302  . naproxen (NAPROSYN) tablet 500 mg  500 mg Oral BID PRN Mike Craze, MD   500 mg at 04/22/12 1714  . nicotine (NICODERM CQ - dosed in mg/24 hours) patch 21 mg  21 mg Transdermal Daily Mike Craze, MD   21 mg at 04/21/12 1712  . ondansetron (ZOFRAN-ODT) disintegrating tablet 4 mg  4 mg Oral Q6H PRN Mike Craze, MD      . QUEtiapine (SEROQUEL) tablet 200 mg  200 mg Oral QHS Mike Craze, MD      . DISCONTD: divalproex (DEPAKOTE) DR  tablet 250 mg  250 mg Oral TID Ronny Bacon, MD   250 mg at 04/22/12 0751  . DISCONTD: QUEtiapine (SEROQUEL XR) 24 hr tablet 400 mg  400 mg Oral QHS Ronny Bacon, MD   400 mg at 04/21/12 2106    Lab Results:  Results for orders placed during the hospital encounter of 04/21/12 (from the past 48 hour(s))  URINALYSIS, ROUTINE W REFLEX MICROSCOPIC     Status: Abnormal   Collection Time   04/21/12  7:01 PM      Component Value Range Comment   Color, Urine YELLOW  YELLOW    APPearance CLEAR  CLEAR    Specific Gravity, Urine 1.014  1.005 - 1.030    pH 7.0  5.0 - 8.0    Glucose, UA NEGATIVE  NEGATIVE mg/dL    Hgb urine dipstick TRACE (*) NEGATIVE    Bilirubin Urine NEGATIVE  NEGATIVE    Ketones, ur NEGATIVE  NEGATIVE mg/dL    Protein, ur NEGATIVE  NEGATIVE mg/dL    Urobilinogen, UA 0.2  0.0 - 1.0 mg/dL    Nitrite NEGATIVE  NEGATIVE    Leukocytes, UA NEGATIVE  NEGATIVE   DRUGS OF ABUSE SCREEN W ALC, ROUTINE URINE     Status: Normal   Collection Time   04/21/12  7:01 PM      Component Value Range Comment   Amphetamine Screen, Ur NEGATIVE  Negative    Marijuana Metabolite NEGATIVE  Negative    Barbiturate Quant, Ur NEGATIVE  Negative    Methadone NEGATIVE  Negative    Propoxyphene NEGATIVE  Negative    Benzodiazepines. NEGATIVE  Negative    Phencyclidine (PCP) NEGATIVE  Negative    Cocaine Metabolites NEGATIVE  Negative    Opiate Screen, Urine NEGATIVE  Negative    Ethyl Alcohol <10  <10 mg/dL    Creatinine,U 40.9     URINE MICROSCOPIC-ADD ON     Status: Normal   Collection Time   04/21/12  7:01 PM      Component Value Range Comment   RBC / HPF 0-2  <3 RBC/hpf   COMPREHENSIVE METABOLIC PANEL     Status: Abnormal   Collection Time   04/21/12  7:15 PM      Component Value Range Comment   Sodium 136  135 - 145 mEq/L    Potassium 5.1  3.5 - 5.1 mEq/L    Chloride 94 (*) 96 - 112 mEq/L    CO2 35 (*) 19 - 32 mEq/L    Glucose, Bld 281 (*) 70 - 99 mg/dL    BUN 11  6 - 23 mg/dL     Creatinine, Ser 0.86  0.50 - 1.35 mg/dL    Calcium 9.9  8.4 - 57.8 mg/dL    Total Protein 7.5  6.0 - 8.3 g/dL    Albumin 4.0  3.5 - 5.2 g/dL    AST 16  0 - 37 U/L    ALT 19  0 - 53 U/L    Alkaline Phosphatase 68  39 - 117 U/L    Total Bilirubin 0.4  0.3 - 1.2 mg/dL    GFR calc non Af Amer 80 (*) >90 mL/min    GFR calc Af Amer >90  >90 mL/min   CBC WITH DIFFERENTIAL     Status: Normal   Collection Time   04/21/12  7:15 PM      Component Value Range Comment   WBC 8.8  4.0 - 10.5 K/uL    RBC 5.49  4.22 - 5.81 MIL/uL    Hemoglobin 16.6  13.0 - 17.0 g/dL    HCT 46.9  62.9 - 52.8 %    MCV 86.7  78.0 - 100.0 fL    MCH 30.2  26.0 - 34.0 pg    MCHC 34.9  30.0 - 36.0 g/dL    RDW 41.3  24.4 - 01.0 %    Platelets 176  150 - 400 K/uL    Neutrophils Relative 45  43 - 77 %    Neutro Abs 3.9  1.7 - 7.7 K/uL    Lymphocytes Relative 43  12 - 46 %    Lymphs Abs 3.8  0.7 - 4.0 K/uL    Monocytes Relative 8  3 - 12 %    Monocytes Absolute 0.7  0.1 - 1.0 K/uL    Eosinophils Relative 4  0 - 5 %    Eosinophils Absolute 0.3  0.0 - 0.7 K/uL    Basophils Relative 1  0 - 1 %    Basophils Absolute 0.1  0.0 - 0.1 K/uL   TSH     Status: Normal   Collection Time   04/21/12  7:15 PM      Component Value Range Comment   TSH 1.230  0.350 - 4.500 uIU/mL   T3, FREE     Status: Normal   Collection Time   04/21/12  7:15 PM      Component Value Range Comment   T3, Free 3.3  2.3 - 4.2 pg/mL   T4, FREE     Status: Normal   Collection Time   04/21/12  7:15 PM      Component Value Range Comment   Free T4 1.14  0.80 - 1.80 ng/dL   HEMOGLOBIN U7O     Status: Abnormal   Collection Time   04/21/12  7:15 PM      Component Value Range Comment   Hemoglobin A1C 7.5 (*) <5.7 %    Mean Plasma Glucose 169 (*) <117 mg/dL   VALPROIC ACID LEVEL     Status: Abnormal   Collection Time   04/21/12  7:15 PM      Component Value Range Comment   Valproic Acid Lvl <10.0 (*) 50.0 - 100.0 ug/mL   GLUCOSE, CAPILLARY     Status:  Abnormal   Collection Time  04/22/12  5:03 PM      Component Value Range Comment   Glucose-Capillary 232 (*) 70 - 99 mg/dL    Comment 1 Notify RN       Physical Findings: AIMS: Facial and Oral Movements Muscles of Facial Expression: None, normal Lips and Perioral Area: None, normal Jaw: None, normal Tongue: None, normal,Extremity Movements Upper (arms, wrists, hands, fingers): None, normal Lower (legs, knees, ankles, toes): None, normal, Trunk Movements Neck, shoulders, hips: None, normal, Overall Severity Severity of abnormal movements (highest score from questions above): None, normal Incapacitation due to abnormal movements: None, normal Patient's awareness of abnormal movements (rate only patient's report): No Awareness, Dental Status Current problems with teeth and/or dentures?: No Does patient usually wear dentures?: No  CIWA:  CIWA-Ar Total: 0  COWS:     Treatment Plan Summary: Daily contact with patient to assess and evaluate symptoms and progress in treatment Medication management No signs/symptoms of withdrawal from abusable substances. Mood/anxiety less than 3/10 where the scale is 1 is the best and 10 is the worst No suicidal or homicidal thoughts for at least 48 hours.  Plan: Admit, use Tegretol for benzodiazepine detox and to treat anxiety, non narcotic analgesics for his pain management.  Discussed the risks, benefits, and probable clinical course with and without treatment.  Pt is agreeable to the current course of treatment.  Lelania Bia 04/22/2012, 5:36 PM
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
## 2012-04-22 NOTE — Progress Notes (Signed)
[facility] Group Notes: (Counselor/Nursing/MHT/Case Management/Adjunct) 04/22/2012   1:15-2:30pm Emotion Regulation  Type of Therapy:  Group Therapy  Participation Level:    Participation Quality:    Affect:    Cognitive:  Appropriate  Insight:    Engagement in Group:   Engagement in Therapy:    Modes of Intervention:  Support and Exploration  Summary of Progress/Problems: Matthew [name] came to group late and was removed from the group twice by staff members. He did not engage personally while present.   Angus Palms, LCSW 04/22/2012  2:51 PM

## 2012-04-22 NOTE — Progress Notes (Signed)
[facility] Group Notes:  (Counselor/Nursing/MHT/Case Management/Adjunct)  04/22/2012 12:28 PM  Type of Therapy:  Psychoeducational Skills  Participation Level:  Did Not Attend  Summary of Progress/Problems: Matthew [name] c/o feeling dizzy and did not attend Psychoeducational group that focused on using quality time with support systems/individuals to engage in healthy coping skills.    Wandra Scot 04/22/2012, 12:28 PM

## 2012-04-22 NOTE — Progress Notes (Signed)
Nutrition Note  Reason: MST score of 5  Patient reported his appetite and intake are good now. He reported PTA he was only eating 1 meal a day because he was very busy. He reported he intentionally lost weight after getting out of prison so he could get off of diabetic medication. He reported now that his weight is down he no longer requires diabetic medication.   I have encouraged the patient to eat regular meals daily. I provided patient several suggestions for meals and snacks on the go to increase PO intake when he is very busy. The patient was without any nutrition questions or concerns. He verbalized understanding of the nutrition information provided.   RD available for nutrition needs.   Iven Finn [facility] [phone]

## 2012-04-22 NOTE — H&P (Signed)
Medical/psychiatric screening examination/treatment/procedure(s) were performed by non-physician practitioner and as supervising physician I was immediately available for consultation/collaboration.  I have seen and examined this patient and agree with the major elements of this evaluation.  

## 2012-04-22 NOTE — Progress Notes (Signed)
Patient seen during during d/c treatment team. He reports being depressed since the death of his mother in Oct 31, 2022.  He advised of wanting to get stable on psychiatric meds.  He states he is over sedated and unable to have a complete thought today. He is followed outpatient by the [facility] clinic in [place].  He reports he is unable to afford a therapist at this time.  He has home, transportation and assess to medications.  Patient shared that he has had difficulty sleeping and had began to hallucinate.

## 2012-04-23 LAB — GLUCOSE, CAPILLARY
Glucose-Capillary: 174 mg/dL — ABNORMAL HIGH (ref 70–99)
Glucose-Capillary: 279 mg/dL — ABNORMAL HIGH (ref 70–99)

## 2012-04-23 LAB — URINE DRUGS OF ABUSE SCREEN W ALC, ROUTINE (REF LAB)
Barbiturate Quant, Ur: NEGATIVE
Benzodiazepines.: NEGATIVE
Creatinine,U: 101 mg/dL
Methadone: NEGATIVE
Phencyclidine (PCP): NEGATIVE
Propoxyphene: NEGATIVE

## 2012-04-23 MED ORDER — QUETIAPINE FUMARATE 25 MG PO TABS
25.0000 mg | ORAL_TABLET | Freq: Three times a day (TID) | ORAL | Status: DC
  Filled 2012-04-23 (×2): qty 1

## 2012-04-23 MED ORDER — AMITRIPTYLINE HCL 50 MG PO TABS
50.0000 mg | ORAL_TABLET | Freq: Every day | ORAL | Status: DC
  Administered 2012-04-23 – 2012-04-26 (×4): 50 mg via ORAL
  Filled 2012-04-23 (×2): qty 1
  Filled 2012-04-23: qty 14
  Filled 2012-04-23: qty 2
  Filled 2012-04-23 (×3): qty 1

## 2012-04-23 MED ORDER — GABAPENTIN 100 MG PO CAPS
100.0000 mg | ORAL_CAPSULE | Freq: Four times a day (QID) | ORAL | Status: AC
  Administered 2012-04-23 (×2): 100 mg via ORAL
  Filled 2012-04-23 (×4): qty 1

## 2012-04-23 MED ORDER — GABAPENTIN 100 MG PO CAPS
200.0000 mg | ORAL_CAPSULE | Freq: Four times a day (QID) | ORAL | Status: AC
  Administered 2012-04-24 (×2): 200 mg via ORAL
  Filled 2012-04-23 (×3): qty 2

## 2012-04-23 MED ORDER — CARBAMAZEPINE 200 MG PO TABS
200.0000 mg | ORAL_TABLET | Freq: Four times a day (QID) | ORAL | Status: DC
  Administered 2012-04-23 – 2012-04-27 (×15): 200 mg via ORAL
  Filled 2012-04-23 (×2): qty 1
  Filled 2012-04-23: qty 56
  Filled 2012-04-23: qty 1
  Filled 2012-04-23: qty 56
  Filled 2012-04-23 (×9): qty 1
  Filled 2012-04-23: qty 56
  Filled 2012-04-23 (×3): qty 1
  Filled 2012-04-23: qty 56
  Filled 2012-04-23 (×3): qty 1

## 2012-04-23 MED ORDER — METHOCARBAMOL 500 MG PO TABS: 750.0000 mg | ORAL_TABLET | Freq: Three times a day (TID) | ORAL | Status: DC | PRN

## 2012-04-23 MED ORDER — GABAPENTIN 300 MG PO CAPS
300.0000 mg | ORAL_CAPSULE | Freq: Four times a day (QID) | ORAL | Status: DC
  Administered 2012-04-24 – 2012-04-27 (×12): 300 mg via ORAL
  Filled 2012-04-23: qty 1
  Filled 2012-04-23: qty 56
  Filled 2012-04-23 (×5): qty 1
  Filled 2012-04-23: qty 56
  Filled 2012-04-23 (×6): qty 1
  Filled 2012-04-23: qty 56
  Filled 2012-04-23: qty 1
  Filled 2012-04-23: qty 56
  Filled 2012-04-23 (×2): qty 1

## 2012-04-23 MED ORDER — QUETIAPINE FUMARATE 50 MG PO TABS: 150.0000 mg | ORAL_TABLET | Freq: Every day | ORAL | Status: DC

## 2012-04-23 MED ORDER — QUETIAPINE FUMARATE 200 MG PO TABS
200.0000 mg | ORAL_TABLET | Freq: Every day | ORAL | Status: DC
  Administered 2012-04-23 – 2012-04-26 (×3): 200 mg via ORAL
  Filled 2012-04-23 (×4): qty 1
  Filled 2012-04-23: qty 14
  Filled 2012-04-23 (×2): qty 1

## 2012-04-23 MED ORDER — METHOCARBAMOL 500 MG PO TABS
1000.0000 mg | ORAL_TABLET | Freq: Three times a day (TID) | ORAL | Status: AC | PRN
  Administered 2012-04-23: 1000 mg via ORAL
  Administered 2012-04-24: 500 mg via ORAL
  Administered 2012-04-24 – 2012-04-25 (×2): 1000 mg via ORAL
  Filled 2012-04-23: qty 2
  Filled 2012-04-23: qty 1
  Filled 2012-04-23 (×2): qty 2

## 2012-04-23 MED ORDER — QUETIAPINE FUMARATE 200 MG PO TABS
200.0000 mg | ORAL_TABLET | Freq: Every day | ORAL | Status: DC
  Filled 2012-04-23: qty 1

## 2012-04-23 MED ORDER — MELOXICAM 7.5 MG PO TABS
7.5000 mg | ORAL_TABLET | Freq: Two times a day (BID) | ORAL | Status: DC
  Administered 2012-04-23: 7.5 mg via ORAL
  Filled 2012-04-23 (×2): qty 1

## 2012-04-23 MED ORDER — QUETIAPINE FUMARATE 25 MG PO TABS
12.5000 mg | ORAL_TABLET | Freq: Three times a day (TID) | ORAL | Status: DC
  Administered 2012-04-25 – 2012-04-26 (×4): 25 mg via ORAL
  Filled 2012-04-23 (×6): qty 2
  Filled 2012-04-23: qty 42
  Filled 2012-04-23 (×5): qty 2
  Filled 2012-04-23 (×2): qty 42
  Filled 2012-04-23 (×3): qty 2

## 2012-04-23 NOTE — Progress Notes (Signed)
Psychoeducational Group Note  Date:  04/23/2012 Time:  1100 Group Topic/Focus:  Building Self Esteem:   The Focus of this group is helping patients become aware of the effects of self-esteem on their lives, the things they and others do that enhance or undermine their self-esteem, seeing the relationship between their level of self-esteem and the choices they make and learning ways to enhance self-esteem.  Participation Level: Did Not Attend  Participation Quality:  Not Applicable  Affect:  Not Applicable  Cognitive:  Not Applicable  Insight:  Not Applicable  Engagement in Group: Not Applicable  Additional Comments:  Pt remained in bed, pt did not attend group.  Karleen Hampshire Brittini 04/23/2012, 6:41 PM

## 2012-04-23 NOTE — Progress Notes (Signed)
D) Patient quiet but cooperative upon my assessment. Patient flat and depressed, forwards little when interacting with staff. Patient states slept "fair", appetite is " good" today. Patient rates depression as 5/10, rates hopeless feelings as 5/10.  Patient denies SI/HI, denies A/V hallucinations at this time.   A) Patient offered support and encouragement. Patient seen by MD to evaluate care plan and medications. Patient verbalized understanding of care plan. Patient remains safe on unit with Q15 minute checks for safety.   R) Patient visible in milieu, but engaging very little with peers on unit. Patient attending groups in day room and meals in dining room. Patient verbalizes no questions/concerns at this time. Patient has a plan to "stay on meds" once he is discharged from [facility]. Will continue to monitor.

## 2012-04-23 NOTE — Progress Notes (Signed)
Psychoeducational Group Note  Date:  04/23/2012 Time:  10:00  Group Topic/Focus:  Gratitude Journaling  Participation Level:  Did Not Attend   Additional Comments:  Pt was asleep in bed and did not attend group.  Gwyndolyn Kaufman 04/23/2012, 1:17 PM

## 2012-04-23 NOTE — Tx Team (Addendum)
Interdisciplinary Treatment Plan Update (Adult)  Date:  04/23/2012  Time Reviewed:  10:22 AM   Progress in Treatment: Attending groups: Yes Participating in groups:  Yes Taking medication as prescribed:  Yes Tolerating medication: Yes Family/Significant othe contact made:  Yes, contact made with:  Patient understands diagnosis: Yes Discussing patient identified problems/goals with staff:  Yes Medical problems stabilized or resolved: Yes Denies suicidal/homicidal ideation: Yes Issues/concerns per patient self-inventory:  No  Other:  New problem(s) identified: None  Reason for Continuation of Hospitalization: Depression Medication stabilization Suicidal ideation  Interventions implemented related to continuation of hospitalization:  Medication stabilization, safety checks q 15 mins, group attendance  Additional comments:  Estimated length of stay:  Discharge Plan:  New goal(s):  Review of initial/current patient goals per problem list:   1.  Goal(s): Decrease symptoms of depression to rating of 4 or less  Met:  No   Target date: by discharge  As evidenced by: Donaven is rating depression at 5  2.  Goal (s): Decrease symptoms of anxiety to rating of 4 or less  Met:  No  Target date: by discharge  As evidenced by: Tierra is rating anxiety at 7  3.  Goal(s): Reduce potential for suicide/self-harm  Met:  No  Target date: by discharge  As evidenced by: Jigar reports on and off suicidal thoughts  4.  Goal(s): Medication stabilization  Met:  No  Target date: by discharge  As evidenced by: Clarkson reports his medications are still too strong and having intolerable side effects at this time  Attendees: Patient:     Family:     Physician:  Dr Orson Aloe, MD 04/23/2012 10:22 AM  Nursing:   Berneice Heinrich, RN 04/23/2012 10:22 AM  Case Manager:  Juline Patch, LCSW 04/23/2012 10:22 AM  Counselor:  Angus Palms, LCSW 04/23/2012 10:22 AM  Other:  [place] Kirk,  Nursing Student 04/23/2012 10:22 AM  Other:  Foye Clock, JMS Intern 04/23/2012 10:22 AM  Other:     Other:      Scribe for Treatment Team:   Billie Lade, 04/23/2012 10:22 AM

## 2012-04-23 NOTE — Progress Notes (Signed)
        [facility] Group Notes: (Counselor/Nursing/MHT/Case Management/Adjunct) 04/23/2012   @1 :15pm [facility] in [facility]  Type of Therapy:  Group Therapy  Participation Level:  Good  Participation Quality:  Good  Affect:   Blunted  Cognitive:  Appropriate  Insight:  Good  Engagement in Group:  Good  Engagement in Therapy:  Good  Modes of Intervention:  Support and Exploration  Summary of Progress/Problems:  Matthew [name] participated with speaker from [employer] [facility] [place] and expressed interest in programs [facility] offers. 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    Billie Lade 04/23/2012  3:40 PM
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
## 2012-04-23 NOTE — Progress Notes (Signed)
[facility] MD Progress Note  04/23/2012 7:19 PM S: "I was getting morphine and methadone [facility].  They took one look at my MRI and stopped the protocol to progress towards opiates and just started Morphine and then added Methadone.  I didn't want the Methadone because of all that I heard about the trouble it to get off that stuff.  You can get my records from [facility] in [place]".   O: Considerable time was spent today with pt to determine his pain management needs.  Pt appears in [facility] in a great deal of pain and reports getting opiates in prison.  Will seek those records. He is able to spout off his prison number with ease, a 10 digit number.  Have requested those records.  Have consulted the pharmacist about getting BuTrans patch for pain management.  Plan: Try and get optimal pain management so that pt can benefit from groups.  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 Hypertension   . Diabetes mellitus   . Degenerative disc disease   . Depression     bipolar   Axis IV: other psychosocial or environmental problems Axis V: 21-30 behavior considerably influenced by delusions or hallucinations OR serious impairment in judgment, communication OR inability to function in almost all areas  ADL's:  Intact  Sleep: Fair  Appetite:  Fair  Suicidal Ideation:  Pt denies any thoughts, plans, intent of suicide Homicidal Ideation:  Pt denies any thoughts, plans, intent of homicide  AEB (as evidenced by):per pt report  Mental Status Examination/Evaluation: Objective:  Appearance: Casual  Eye Contact::  Good  Speech:  Clear and Coherent  Volume:  Normal  Mood:  Anxious, Depressed and Irritable  Affect:  Congruent  Thought Process:  Coherent  Orientation:  Full  Thought Content:  WDL  Suicidal Thoughts:  No  Homicidal Thoughts:  No  Memory:  Immediate;    Fair Recent;   Fair Remote;   Fair  Judgement:  Impaired  Insight:  Lacking  Psychomotor Activity:  Normal  Concentration:  Fair  Recall:  Fair  Akathisia:  No  Handed:  Right  AIMS (if indicated):     Assets:  Communication Skills Desire for Improvement  Sleep:  Number of Hours: 6.75    Vital Signs:Blood pressure 152/96, pulse 86, temperature 97.8 F (36.6 C), temperature source Oral, resp. rate 18, height 6' (1.829 m), weight 87.998 kg (194 lb), SpO2 98.00%. Current Medications: Current Facility-Administered Medications  Medication Dose Route Frequency Provider Last Rate Last Dose  . acetaminophen (TYLENOL) tablet 650 mg  650 mg Oral Q6H PRN Curlene Labrum Readling, MD      . alum & mag hydroxide-simeth (MAALOX/MYLANTA) 200-200-20 MG/5ML suspension 30 mL  30 mL Oral Q4H PRN Curlene Labrum Readling, MD      . amitriptyline (ELAVIL) tablet 50 mg  50 mg Oral QHS Mike Craze, MD      . carbamazepine (TEGRETOL) tablet 200 mg  200 mg Oral QID Mike Craze, MD      . chlordiazePOXIDE (LIBRIUM) capsule 25 mg  25 mg Oral Q6H PRN Mike Craze, MD   25 mg at 04/22/12 1943  . cloNIDine (CATAPRES - Dosed in mg/24 hr) patch 0.2 mg  0.2 mg Transdermal NOW Mike Craze, MD   0.2 mg at 04/22/12 1259  . diclofenac sodium (VOLTAREN) 1 % transdermal  gel 2 g  2 g Topical QID Mike Craze, MD   2 g at 04/22/12 2133   Followed by  . lidocaine (LIDODERM) 5 % 1 patch  1 patch Transdermal Q24H Mike Craze, MD   1 patch at 04/23/12 0747  . dicyclomine (BENTYL) tablet 20 mg  20 mg Oral Q6H Curlene Labrum Readling, MD   20 mg at 04/23/12 1301  . dicyclomine (BENTYL) tablet 20 mg  20 mg Oral Q6H PRN Mike Craze, MD      . gabapentin (NEURONTIN) capsule 100 mg  100 mg Oral QID Mike Craze, MD   100 mg at 04/23/12 1737   Followed by  . gabapentin (NEURONTIN) capsule 200 mg  200 mg Oral QID Mike Craze, MD       Followed by  . gabapentin (NEURONTIN) capsule 300 mg  300 mg Oral QID Mike Craze, MD      .  hydrOXYzine (ATARAX/VISTARIL) tablet 25 mg  25 mg Oral Q6H PRN Mike Craze, MD      . lisinopril (PRINIVIL,ZESTRIL) tablet 20 mg  20 mg Oral BH-q7a Curlene Labrum Readling, MD   20 mg at 04/23/12 [phone]  . loperamide (IMODIUM) capsule 2-4 mg  2-4 mg Oral PRN Mike Craze, MD      . magnesium hydroxide (MILK OF MAGNESIA) suspension 30 mL  30 mL Oral Daily PRN Curlene Labrum Readling, MD      . meloxicam (MOBIC) tablet 7.5 mg  7.5 mg Oral BID Mike Craze, MD      . metFORMIN (GLUCOPHAGE) tablet 500 mg  500 mg Oral BID WC Curlene Labrum Readling, MD   500 mg at 04/23/12 1737  . methocarbamol (ROBAXIN) tablet 1,000 mg  1,000 mg Oral Q8H PRN Mike Craze, MD      . naproxen (NAPROSYN) tablet 500 mg  500 mg Oral BID PRN Mike Craze, MD   500 mg at 04/22/12 1714  . nicotine (NICODERM CQ - dosed in mg/24 hours) patch 21 mg  21 mg Transdermal Daily Mike Craze, MD   21 mg at 04/23/12 9604  . ondansetron (ZOFRAN-ODT) disintegrating tablet 4 mg  4 mg Oral Q6H PRN Mike Craze, MD      . QUEtiapine (SEROQUEL) tablet 12.5-25 mg  12.5-25 mg Oral TID Mike Craze, MD      . QUEtiapine (SEROQUEL) tablet 200 mg  200 mg Oral QHS Mike Craze, MD   200 mg at 04/23/12 1821  . DISCONTD: amitriptyline (ELAVIL) tablet 25 mg  25 mg Oral QHS Mike Craze, MD   25 mg at 04/22/12 2132  . DISCONTD: carbamazepine (TEGRETOL) tablet 200 mg  200 mg Oral TID Mike Craze, MD   200 mg at 04/23/12 1135  . DISCONTD: meloxicam (MOBIC) tablet 7.5 mg  7.5 mg Oral Daily Mike Craze, MD   7.5 mg at 04/23/12 0747  . DISCONTD: methocarbamol (ROBAXIN) tablet 500 mg  500 mg Oral Q8H PRN Mike Craze, MD   500 mg at 04/23/12 1301  . DISCONTD: methocarbamol (ROBAXIN) tablet 750 mg  750 mg Oral Q8H PRN Mike Craze, MD      . DISCONTD: QUEtiapine (SEROQUEL) tablet 150 mg  150 mg Oral QHS Mike Craze, MD      . DISCONTD: QUEtiapine (SEROQUEL) tablet 200 mg  200 mg Oral QHS Mike Craze, MD   200 mg at 04/22/12 2133  .  DISCONTD:  QUEtiapine (SEROQUEL) tablet 200 mg  200 mg Oral QHS Mike Craze, MD      . DISCONTD: QUEtiapine (SEROQUEL) tablet 25 mg  25 mg Oral TID Mike Craze, MD        Lab Results:  Results for orders placed during the hospital encounter of 04/21/12 (from the past 48 hour(s))  DRUGS OF ABUSE SCREEN W ALC, ROUTINE URINE     Status: Normal   Collection Time   04/22/12  1:05 PM      Component Value Range Comment   Amphetamine Screen, Ur NEGATIVE  Negative    Marijuana Metabolite NEGATIVE  Negative    Barbiturate Quant, Ur NEGATIVE  Negative    Methadone NEGATIVE  Negative    Propoxyphene NEGATIVE  Negative    Benzodiazepines. NEGATIVE  Negative    Phencyclidine (PCP) NEGATIVE  Negative    Cocaine Metabolites NEGATIVE  Negative    Opiate Screen, Urine NEGATIVE  Negative    Ethyl Alcohol <10  <10 mg/dL    Creatinine,U 956.2     GLUCOSE, CAPILLARY     Status: Abnormal   Collection Time   04/22/12  5:03 PM      Component Value Range Comment   Glucose-Capillary 232 (*) 70 - 99 mg/dL    Comment 1 Notify RN     GLUCOSE, CAPILLARY     Status: Abnormal   Collection Time   04/22/12  8:50 PM      Component Value Range Comment   Glucose-Capillary 281 (*) 70 - 99 mg/dL    Comment 1 Notify RN     GLUCOSE, CAPILLARY     Status: Abnormal   Collection Time   04/23/12  6:03 AM      Component Value Range Comment   Glucose-Capillary 174 (*) 70 - 99 mg/dL   GLUCOSE, CAPILLARY     Status: Abnormal   Collection Time   04/23/12 11:55 AM      Component Value Range Comment   Glucose-Capillary 176 (*) 70 - 99 mg/dL   GLUCOSE, CAPILLARY     Status: Abnormal   Collection Time   04/23/12  5:35 PM      Component Value Range Comment   Glucose-Capillary 254 (*) 70 - 99 mg/dL    Comment 1 Notify RN       Physical Findings: AIMS: Facial and Oral Movements Muscles of Facial Expression: None, normal Lips and Perioral Area: None, normal Jaw: None, normal Tongue: None, normal,Extremity Movements Upper (arms,  wrists, hands, fingers): None, normal Lower (legs, knees, ankles, toes): None, normal, Trunk Movements Neck, shoulders, hips: None, normal, Overall Severity Severity of abnormal movements (highest score from questions above): None, normal Incapacitation due to abnormal movements: None, normal Patient's awareness of abnormal movements (rate only patient's report): No Awareness, Dental Status Current problems with teeth and/or dentures?: No Does patient usually wear dentures?: No  CIWA:  CIWA-Ar Total: 0  COWS:     Treatment Plan Summary: Daily contact with patient to assess and evaluate symptoms and progress in treatment Medication management No signs/symptoms of withdrawal from abusable substances. Mood/anxiety less than 3/10 where the scale is 1 is the best and 10 is the worst No suicidal or homicidal thoughts for at least 48 hours.  Wrangler Penning 04/23/2012, 7:19 PM

## 2012-04-23 NOTE — Progress Notes (Signed)
Pt at the med window for his 2000 Bentyl 20mg .  Pt became verbally hostile when asked about other meds and found out that he had med changes.  Spent time with pt talking about the changes and the meds he was ordered.  Pt voiced understanding and thanked this Clinical research associate for her time.  He said he was here to get his psych meds adjusted, but did not want any changes to his other medications.  He is upset that he cannot get his Percocet.  Pt was eventually able to calm down.  Offered a prn Librium which he accepted.  Told him Vistaril was also available later.  Pt denies HI/AV.  He says he has suicidal thoughts off/on.  He contracts for safety on the unit.  Pt makes his needs known to staff.  He voiced no other needs/concerns.  Safety maintained with q15 minute checks.

## 2012-04-23 NOTE — Tx Team (Addendum)
Interdisciplinary Treatment Plan Update (Adult)  Date:  04/23/2012  Time Reviewed:  10:29 AM   Progress in Treatment: Attending groups:   Yes   Participating in groups:  Yes Taking medication as prescribed:  Yes Tolerating medication:  Yes Family/Significant othe contact made:  Patient understands diagnosis:  Yes Discussing patient identified problems/goals with staff: Yes Medical problems stabilized or resolved: Yes Denies suicidal/homicidal ideation:Yes Issues/concerns per patient self-inventory:  Other:  New problem(s) identified:  Reason for Continuation of Hospitalization: Anxiety Depression Medication stabilization  Interventions implemented related to continuation of hospitalization:  Medication Management; safety checks q 15 mins  Additional comments:  Estimated length of stay:  Discharge Plan:  New goal(s):  Review of initial/current patient goals per problem list:    1.  Goal(s): Eliminate SI/other thoughts of self harm   Met:  Yes  Target date: d/c  As evidenced by: Patient no longer endorsing SI/HI or other thoughts of self harm.    2.  Goal (s): Reduce depression/anxiety (rated at five)   Met:  No  Target date: d/c  As evidenced by: Patient will rate symptoms at four or below    3.  Goal(s): .stabilize on meds   Met:  No  Target date: d/c  As evidenced by: Patient will report being stable on medications - symptoms have decreased    4.  Goal(s): Refer for outpatient follow up   Met:  Yes  Target date: d/c  As evidenced by: Follow up appointment will be scheduled    Attendees: Patient:     Family:     Physician:  Orson Aloe, MD 04/23/2012 10:29 AM   Nursing:   Berneice Heinrich, RN 04/23/2012 10:29 AM   CaseManager:  Juline Patch, LCSW 04/23/2012 10:29 AM   Counselor:  Angus Palms, LCSW 04/23/2012 10:29 AM   Other:  Foye Clock, MSW Intern    04/23/2012 10:35 AM Other:  [place] Kirk, Nursing Student     04/23/2012  10:35 AM

## 2012-04-24 LAB — GLUCOSE, CAPILLARY
Glucose-Capillary: 171 mg/dL — ABNORMAL HIGH (ref 70–99)
Glucose-Capillary: 274 mg/dL — ABNORMAL HIGH (ref 70–99)
Glucose-Capillary: 292 mg/dL — ABNORMAL HIGH (ref 70–99)

## 2012-04-24 LAB — CARBAMAZEPINE LEVEL, TOTAL: Carbamazepine Lvl: 8.2 ug/mL (ref 4.0–12.0)

## 2012-04-24 MED ORDER — PREDNISONE 20 MG PO TABS
40.0000 mg | ORAL_TABLET | Freq: Every day | ORAL | Status: AC
  Administered 2012-04-25: 40 mg via ORAL
  Filled 2012-04-24: qty 2
  Filled 2012-04-24 (×2): qty 3

## 2012-04-24 MED ORDER — PREDNISONE 50 MG PO TABS
60.0000 mg | ORAL_TABLET | Freq: Every day | ORAL | Status: AC
  Administered 2012-04-24: 60 mg via ORAL
  Filled 2012-04-24: qty 1

## 2012-04-24 MED ORDER — MELOXICAM 7.5 MG PO TABS
7.5000 mg | ORAL_TABLET | Freq: Two times a day (BID) | ORAL | Status: DC
  Administered 2012-04-24 – 2012-04-27 (×7): 7.5 mg via ORAL
  Filled 2012-04-24: qty 14
  Filled 2012-04-24 (×4): qty 1
  Filled 2012-04-24: qty 14
  Filled 2012-04-24 (×6): qty 1

## 2012-04-24 MED ORDER — PREDNISONE 20 MG PO TABS
20.0000 mg | ORAL_TABLET | Freq: Every day | ORAL | Status: AC
  Administered 2012-04-26: 20 mg via ORAL
  Filled 2012-04-24: qty 1

## 2012-04-24 MED ORDER — FENTANYL 25 MCG/HR TD PT72
25.0000 ug | MEDICATED_PATCH | TRANSDERMAL | Status: DC
  Administered 2012-04-24: 25 ug via TRANSDERMAL
  Filled 2012-04-24: qty 1

## 2012-04-24 NOTE — Progress Notes (Signed)
04/24/2012         Time: 1500      Group Topic/Focus: The focus of this group is on enhancing patients' problem solving skills, which involves identifying the problem, brainstorming solutions and choosing and trying a solution.  Participation Level: Active  Participation Quality: Appropriate and Attentive  Affect: Appropriate  Cognitive: Oriented  Additional Comments: None.   Rasheen Schewe 04/24/2012 3:44 PM
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
## 2012-04-24 NOTE — Progress Notes (Signed)
[facility] MD Progress Note  04/24/2012 7:46 AM S: "I don't want to bother with the Lidoderm patch.  I haven't taken prednisone like that, that I remember".   O: Pharmacist suggests a Duragesic patch and a pulse of prednisone.  Will do that.  Plan: Try and get optimal pain management so that pt can benefit from groups.  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 Hypertension   . Diabetes mellitus   . Degenerative disc disease   . Depression     bipolar   Axis IV: other psychosocial or environmental problems Axis V: 21-30 behavior considerably influenced by delusions or hallucinations OR serious impairment in judgment, communication OR inability to function in almost all areas  ADL's:  Intact  Sleep per pt rating: Good, with taking the Seroquel at 1800 and waking up alert and rested.  Sleep noted by staff:Marland Kitchen Number of Hours: 6.5   Appetite:  Fair  Suicidal Ideation:  Pt denies any thoughts, plans, intent of suicide Homicidal Ideation:  Pt denies any thoughts, plans, intent of homicide  AEB (as evidenced by):per pt report  Mental Status Examination/Evaluation: Objective:  Appearance: Casual  Eye Contact::  Good  Speech:  Clear and Coherent  Volume:  Normal  Mood:  Anxious, Depressed and Irritable, from essentially untreated pain.  Affect:  Congruent  Thought Process:  Coherent  Orientation:  Full  Thought Content:  WDL  Suicidal Thoughts:  No  Homicidal Thoughts:  No  Memory:  Immediate;   Fair Recent;   Fair Remote;   Fair  Judgement:  Impaired  Insight:  Lacking  Psychomotor Activity:  Normal  Concentration:  Fair  Recall:  Fair  Akathisia:  No  Handed:  Right  AIMS (if indicated):     Assets:  Communication Skills Desire for Improvement  Sleep:  Number of Hours: 6.5    Vital Signs:Blood pressure 152/96, pulse 86, temperature 97.8 F (36.6  C), temperature source Oral, resp. rate 18, height 6' (1.829 m), weight 87.998 kg (194 lb), SpO2 98.00%. Current Medications: Current Facility-Administered Medications  Medication Dose Route Frequency Provider Last Rate Last Dose  . acetaminophen (TYLENOL) tablet 650 mg  650 mg Oral Q6H PRN Curlene Labrum Readling, MD      . alum & mag hydroxide-simeth (MAALOX/MYLANTA) 200-200-20 MG/5ML suspension 30 mL  30 mL Oral Q4H PRN Curlene Labrum Readling, MD      . amitriptyline (ELAVIL) tablet 50 mg  50 mg Oral QHS Mike Craze, MD   50 mg at 04/23/12 2207  . carbamazepine (TEGRETOL) tablet 200 mg  200 mg Oral QID Mike Craze, MD   200 mg at 04/23/12 2207  . chlordiazePOXIDE (LIBRIUM) capsule 25 mg  25 mg Oral Q6H PRN Mike Craze, MD   25 mg at 04/22/12 1943  . cloNIDine (CATAPRES - Dosed in mg/24 hr) patch 0.2 mg  0.2 mg Transdermal NOW Mike Craze, MD   0.2 mg at 04/22/12 1259  . dicyclomine (BENTYL) tablet 20 mg  20 mg Oral Q6H Curlene Labrum Readling, MD   20 mg at 04/23/12 1945  . dicyclomine (BENTYL) tablet 20 mg  20 mg Oral Q6H PRN Mike Craze, MD      . fentaNYL (DURAGESIC - dosed mcg/hr) patch 25 mcg  25 mcg Transdermal Q72H Mike Craze, MD      . gabapentin (NEURONTIN)  capsule 100 mg  100 mg Oral QID Mike Craze, MD   100 mg at 04/23/12 1948   Followed by  . gabapentin (NEURONTIN) capsule 200 mg  200 mg Oral QID Mike Craze, MD       Followed by  . gabapentin (NEURONTIN) capsule 300 mg  300 mg Oral QID Mike Craze, MD      . hydrOXYzine (ATARAX/VISTARIL) tablet 25 mg  25 mg Oral Q6H PRN Mike Craze, MD      . lisinopril (PRINIVIL,ZESTRIL) tablet 20 mg  20 mg Oral BH-q7a Curlene Labrum Readling, MD   20 mg at 04/24/12 [phone]  . loperamide (IMODIUM) capsule 2-4 mg  2-4 mg Oral PRN Mike Craze, MD      . magnesium hydroxide (MILK OF MAGNESIA) suspension 30 mL  30 mL Oral Daily PRN Curlene Labrum Readling, MD      . meloxicam (MOBIC) tablet 7.5 mg  7.5 mg Oral BID Mike Craze, MD      .  metFORMIN (GLUCOPHAGE) tablet 500 mg  500 mg Oral BID WC Curlene Labrum Readling, MD   500 mg at 04/23/12 1737  . methocarbamol (ROBAXIN) tablet 1,000 mg  1,000 mg Oral Q8H PRN Mike Craze, MD   1,000 mg at 04/23/12 1949  . naproxen (NAPROSYN) tablet 500 mg  500 mg Oral BID PRN Mike Craze, MD   500 mg at 04/22/12 1714  . nicotine (NICODERM CQ - dosed in mg/24 hours) patch 21 mg  21 mg Transdermal Daily Mike Craze, MD   21 mg at 04/24/12 1191  . ondansetron (ZOFRAN-ODT) disintegrating tablet 4 mg  4 mg Oral Q6H PRN Mike Craze, MD      . predniSONE (DELTASONE) tablet 60 mg  60 mg Oral Q breakfast Mike Craze, MD       Followed by  . predniSONE (DELTASONE) tablet 40 mg  40 mg Oral Q breakfast Mike Craze, MD       Followed by  . predniSONE (DELTASONE) tablet 20 mg  20 mg Oral Q breakfast Mike Craze, MD      . QUEtiapine (SEROQUEL) tablet 12.5-25 mg  12.5-25 mg Oral TID Mike Craze, MD      . QUEtiapine (SEROQUEL) tablet 200 mg  200 mg Oral QHS Mike Craze, MD   200 mg at 04/23/12 1821  . DISCONTD: amitriptyline (ELAVIL) tablet 25 mg  25 mg Oral QHS Mike Craze, MD   25 mg at 04/22/12 2132  . DISCONTD: carbamazepine (TEGRETOL) tablet 200 mg  200 mg Oral TID Mike Craze, MD   200 mg at 04/23/12 1135  . DISCONTD: lidocaine (LIDODERM) 5 % 1 patch  1 patch Transdermal Q24H Mike Craze, MD   1 patch at 04/23/12 0747  . DISCONTD: meloxicam (MOBIC) tablet 7.5 mg  7.5 mg Oral Daily Mike Craze, MD   7.5 mg at 04/23/12 0747  . DISCONTD: meloxicam (MOBIC) tablet 7.5 mg  7.5 mg Oral BID Mike Craze, MD   7.5 mg at 04/23/12 1945  . DISCONTD: methocarbamol (ROBAXIN) tablet 500 mg  500 mg Oral Q8H PRN Mike Craze, MD   500 mg at 04/23/12 1301  . DISCONTD: methocarbamol (ROBAXIN) tablet 750 mg  750 mg Oral Q8H PRN Mike Craze, MD      . DISCONTD: QUEtiapine (SEROQUEL) tablet 150 mg  150 mg Oral QHS Mike Craze, MD      .  DISCONTD: QUEtiapine (SEROQUEL) tablet 200 mg   200 mg Oral QHS Mike Craze, MD   200 mg at 04/22/12 2133  . DISCONTD: QUEtiapine (SEROQUEL) tablet 200 mg  200 mg Oral QHS Mike Craze, MD      . DISCONTD: QUEtiapine (SEROQUEL) tablet 25 mg  25 mg Oral TID Mike Craze, MD        Lab Results:  Results for orders placed during the hospital encounter of 04/21/12 (from the past 48 hour(s))  DRUGS OF ABUSE SCREEN W ALC, ROUTINE URINE     Status: Normal   Collection Time   04/22/12  1:05 PM      Component Value Range Comment   Amphetamine Screen, Ur NEGATIVE  Negative    Marijuana Metabolite NEGATIVE  Negative    Barbiturate Quant, Ur NEGATIVE  Negative    Methadone NEGATIVE  Negative    Propoxyphene NEGATIVE  Negative    Benzodiazepines. NEGATIVE  Negative    Phencyclidine (PCP) NEGATIVE  Negative    Cocaine Metabolites NEGATIVE  Negative    Opiate Screen, Urine NEGATIVE  Negative    Ethyl Alcohol <10  <10 mg/dL    Creatinine,U 161.0     GLUCOSE, CAPILLARY     Status: Abnormal   Collection Time   04/22/12  5:03 PM      Component Value Range Comment   Glucose-Capillary 232 (*) 70 - 99 mg/dL    Comment 1 Notify RN     GLUCOSE, CAPILLARY     Status: Abnormal   Collection Time   04/22/12  8:50 PM      Component Value Range Comment   Glucose-Capillary 281 (*) 70 - 99 mg/dL    Comment 1 Notify RN     GLUCOSE, CAPILLARY     Status: Abnormal   Collection Time   04/23/12  6:03 AM      Component Value Range Comment   Glucose-Capillary 174 (*) 70 - 99 mg/dL   GLUCOSE, CAPILLARY     Status: Abnormal   Collection Time   04/23/12 11:55 AM      Component Value Range Comment   Glucose-Capillary 176 (*) 70 - 99 mg/dL   GLUCOSE, CAPILLARY     Status: Abnormal   Collection Time   04/23/12  5:35 PM      Component Value Range Comment   Glucose-Capillary 254 (*) 70 - 99 mg/dL    Comment 1 Notify RN     GLUCOSE, CAPILLARY     Status: Abnormal   Collection Time   04/23/12  9:58 PM      Component Value Range Comment   Glucose-Capillary  279 (*) 70 - 99 mg/dL    Comment 1 Notify RN      Comment 2 Documented in Chart     GLUCOSE, CAPILLARY     Status: Abnormal   Collection Time   04/24/12  6:16 AM      Component Value Range Comment   Glucose-Capillary 171 (*) 70 - 99 mg/dL    Comment 1 Notify RN      Comment 2 Documented in Chart       Physical Findings: AIMS: Facial and Oral Movements Muscles of Facial Expression: None, normal Lips and Perioral Area: None, normal Jaw: None, normal Tongue: None, normal,Extremity Movements Upper (arms, wrists, hands, fingers): None, normal Lower (legs, knees, ankles, toes): None, normal, Trunk Movements Neck, shoulders, hips: None, normal, Overall Severity Severity of abnormal movements (highest score from questions above):  None, normal Incapacitation due to abnormal movements: None, normal Patient's awareness of abnormal movements (rate only patient's report): No Awareness, Dental Status Current problems with teeth and/or dentures?: No Does patient usually wear dentures?: No  CIWA:  CIWA-Ar Total: 1  COWS:     Treatment Plan Summary: Daily contact with patient to assess and evaluate symptoms and progress in treatment Medication management No signs/symptoms of withdrawal from abusable substances. Mood/anxiety less than 3/10 where the scale is 1 is the best and 10 is the worst No suicidal or homicidal thoughts for at least 48 hours.  Matthew [name] 04/24/2012, 7:46 AM

## 2012-04-24 NOTE — Progress Notes (Signed)
Inpatient Diabetes Program Recommendations  AACE/ADA: New Consensus Statement on Inpatient Glycemic Control (2013)  Target Ranges:  Prepandial:   less than 140 mg/dL      Peak postprandial:   less than 180 mg/dL (1-2 hours)      Critically ill patients:  140 - 180 mg/dL   Reason for Visit: Consult - Hyperglycemia  36 yo white male has been depressed since his mother died back in 11-07-22. He buried her ashes two days ago and has not gotten out of bed since. Says he is actively suicidal and slept with a razor blade in with him the past 2 nights. Has Lost from 270-190 lbs to get off insulin and has recently regained 10 lbs. Says he has been non-compliant with his meds due to drowsiness and because he just doesn't care.  Has always been moody since his teens. Required 87 stitches L arm after attempting to cut himself while high age 54.   States he wants to "get back on track with controlling blood sugars."   Results for GRADYN, FEIK (MRN [id number]) as of 04/24/2012 16:59  Ref. Range 04/23/2012 06:03 04/23/2012 11:55 04/23/2012 17:35 04/23/2012 21:58 04/24/2012 06:16 04/24/2012 12:04 04/24/2012 16:56  Glucose-Capillary Latest Range: 70-99 mg/dL 865 (H) 784 (H) 696 (H) 279 (H) 171 (H) 274 (H) 312 (H)  Results for AUBREY, POWELSON (MRN [id number]) as of 04/24/2012 16:59  Ref. Range 04/21/2012 19:15  Hemoglobin A1C Latest Range: <5.7 % 7.5 (H)   Recommendations:  Increase metformin to 1000 mg bid Add meal coverage insulin - Novolog 4 units tidwc. F/U with PCP to manage blood sugars.  May need additional OHA such as tradjenta.  Will follow.

## 2012-04-24 NOTE — Progress Notes (Signed)
Patient did not attend the evening karaoke group. Patient was encouraged to go but patient responded " I took my sleeping medication early so I am going to try and get some sleep".

## 2012-04-24 NOTE — Progress Notes (Signed)
  D) Patient pleasant and cooperative upon my assessment. Patient states slept " fair," and  appetite is "improving ." Patient rates depression as  8 /10, patient rates hopeless feelings as 7 /10. Patient denies SI/HI, denies A/V hallucinations.  Patient is more involved in groups and appears brighter today  A) Patient offered support and encouragement, patient encouraged to discuss feelings/concerns with staff. Patient verbalized understanding. Patient monitored Q15 minutes for safety. Patient met with MD  to discuss today's goals and plan of care.  R) Patient active on unit, attending groups in day room and meals in dining room.  Patient taking medications as ordered. Patient has goal to "take meds" once he is discharged from [facility]. Will continue to monitor.

## 2012-04-24 NOTE — Progress Notes (Signed)
Psychoeducational Group Note  Date:  04/24/2012 Time: 1100  Group Topic/Focus:  Early Warning Signs:   The focus of this group is to help patients identify signs or symptoms they exhibit before slipping into an unhealthy state or crisis.  Participation Level:  Active  Participation Quality:  Appropriate, Attentive and Sharing  Affect:  Appropriate  Cognitive:  Appropriate  Insight:  Good  Engagement in Group:  Good  Additional Comments:   Pt attended Early Warning Signs Group. Pt encouraged to share an episode that brought him into the hospital. Pt stated what he was thinking while in crisis. Pt explained signs that were beginning to become unhealthy during the time of crisis. Pt stated warning signs of relapse in categories of behavior, attitude, feeling, and thought changes, and an example to follow. Pt discussed how relapsing it effected relationships and personal life.   Karleen Hampshire Brittini 04/24/2012, 1:16 PM

## 2012-04-24 NOTE — Progress Notes (Signed)
Patient ID: Matthew [name], male   DOB: 06-18-1976, 36 y.o.   MRN: [id number] D: Pt. Lying in bed, reports "I'm just laying here, cause I'm in so much pain" Pt. Rates pain as high as "9" of 10 and depression at "5" of 10. "I'm trying to get my medication right, I was off them."  Writer noted that pt. Had on a lidocaine patch. Pt. Replies "that's thing is a joke."  A: Writer provided support, encouraged pt. To give med a try. Staff will monitor q53min for safety. Pt. Encouraged to attend group. Writer reviewed meds and administered as ordered(see MAR).  R: Pt. Is safe on the unit. Pt. Did attend Karaoke. Pt. Takes meds without incidence.

## 2012-04-24 NOTE — Progress Notes (Signed)
Patient seen during d/c planning group. He reports being a lot better today and denies SI/HI.  He rates depression at six, anxiety at six/seven and hopelessness/hopelessness at five.  He shared that he had been off medications for several weeks and had become very agitated.

## 2012-04-24 NOTE — Progress Notes (Signed)
[facility] Group Notes: (Counselor/Nursing/MHT/Case Management/Adjunct) 04/24/2012   @1 :15 -2:30pm Preventing Relapse  Type of Therapy:  Group Therapy  Participation Level:  Active  Participation Quality: Appropriate, Sharing, Supportive    Affect:  Blunted  Cognitive:  Appropriate  Insight:  Good  Engagement in Group: Good  Engagement in Therapy: Good  Modes of Intervention:  Support and Exploration  Summary of Progress/Problems: Mitch was very engaged in group. He explored the steps he has taken toward recovery so far, and how he is trying to "rewire" his brain to respond differently. Aditya processed his experience of coming out of prison after 15 years and finding the world very different. He shared how he feels "instiutionalized" and needs to change everything about himself in able to function in the world. He related very well to Autobiography in 5 Chapters, stating that he has not been moving backward, but has become stuck in the hole and has not yet figured out how to get out. However, he is now realizing to that finding support (such as other group members) and opening up about what is going on with him is moving him further out of that stuck routine. Another group member shared with him ways that she has seen him make progress during this hospitalization, and he quickly dismissed or countered these statements, as he was uncomfortable with compliments. The other group member confronted him on this as a change he can make toward recovery and a better sense of self, which Linsey was open to.   Angus Palms, LCSW 04/24/2012 2:23 PM

## 2012-04-25 DIAGNOSIS — F339 Major depressive disorder, recurrent, unspecified: Secondary | ICD-10-CM

## 2012-04-25 LAB — GLUCOSE, CAPILLARY: Glucose-Capillary: 351 mg/dL — ABNORMAL HIGH (ref 70–99)

## 2012-04-25 MED ORDER — INSULIN ASPART 100 UNIT/ML ~~LOC~~ SOLN
0.0000 [IU] | Freq: Three times a day (TID) | SUBCUTANEOUS | Status: DC
  Administered 2012-04-25 – 2012-04-26 (×3): 11 [IU] via SUBCUTANEOUS
  Administered 2012-04-26: 5 [IU] via SUBCUTANEOUS
  Administered 2012-04-27: 11 [IU] via SUBCUTANEOUS
  Administered 2012-04-27: 8 [IU] via SUBCUTANEOUS

## 2012-04-25 MED ORDER — INSULIN ASPART 100 UNIT/ML ~~LOC~~ SOLN
4.0000 [IU] | Freq: Three times a day (TID) | SUBCUTANEOUS | Status: DC
  Administered 2012-04-25 – 2012-04-27 (×6): 4 [IU] via SUBCUTANEOUS

## 2012-04-25 NOTE — Progress Notes (Signed)
Patient ID: Matthew [name], male   DOB: 1976-07-28, 36 y.o.   MRN: [id number]   [facility] Group Notes:  (Counselor/Nursing/MHT/Case Management/Adjunct)  04/25/2012 1:15 PM  Type of Therapy:  Group Therapy, Dance/Movement Therapy   Participation Level:  Active  Participation Quality:  Appropriate, Attentive and Sharing  Affect:  Appropriate  Cognitive:  Appropriate  Insight:  Good  Engagement in Group:  Good  Engagement in Therapy:  Good  Modes of Intervention:  Clarification, Problem-solving, Role-play, Socialization and Support  Summary of Progress/Problems: Therapist and group members discussed self-sabotaging behaviors as well as healthy coping skills. Group members shared one positive thing to be thankful for today and how it is important to focus on the present. Pt shared a quote with the group, "the past is history, the future is a mystery and the present is a gift." - pt stated this quote has always stuck with him. Pt shared that he is thankful to be awake and able to attend and remember groups. Pt shared that he is also thankful that he is going to have a visitor and that he is alive.      Cassidi Long 04/25/2012. 3:09 PM

## 2012-04-25 NOTE — Progress Notes (Signed)
Writer observed patient up and about the unit. Patient attended group, has been watching tv and interacting with select peers. Patient informed of scheduled medications and is agreeable to taking them. Patient continues to c/o back and left hip pain and was given robaxin before group. Patient rates his depression at a 8 and hopelessness a 7.  Patient currently denies si/hi/a/v hallucinations. Patient has been on the phone in hallway arguing with someone on the phone. Patient encouraged to focus on his reason for being here and work on those issues. Patient safety maintained on unit with 15 min checks. Will continue to monitor.

## 2012-04-25 NOTE — Progress Notes (Signed)
[facility] MD Progress Note  04/25/2012 2:15 PM  S: "I came in here on Tuesday because I was having suicidal thoughts.  I think the reason for that was because I needed my medicines to be adjusted. The doctor has done that, I am waiting for it to take effect.  For the first few days after my medicines were adjusted, I felt very drowsy. The drowsiness is better now".  Diagnosis:   Axis I: Major depressive disorder, recurrent episode Axis II: Deferred Axis III:  Past Medical History  Diagnosis Date  . Hypertension   . Diabetes mellitus   . Degenerative disc disease   . Depression     bipolar   Axis IV: other psychosocial or environmental problems Axis V: 41-50 serious symptoms  ADL's:  Intact  Sleep: Fair  Appetite:  Good  Suicidal Ideation: "No" Plan:  No Intent:  No Means:  No Homicidal Ideation: "No" Plan:  No Intent:  No Means:  no  AEB (as evidenced by): per patient's reports.  Mental Status Examination/Evaluation: Objective:  Appearance: Casual  Eye Contact::  Good  Speech:  Clear and Coherent  Volume:  Normal  Mood:  "My mood is fine"  Affect:  Appropriate  Thought Process:  Coherent  Orientation:  Full  Thought Content:  Rumination  Suicidal Thoughts:  No  Homicidal Thoughts:  No  Memory:  Immediate;   Good Recent;   Good Remote;   Good  Judgement:  Fair  Insight:  Fair  Psychomotor Activity:  Normal  Concentration:  Good  Recall:  Good  Akathisia:  No  Handed:  Right  AIMS (if indicated):     Assets:  Desire for Improvement  Sleep:  Number of Hours: 5    Vital Signs:Blood pressure 128/90, pulse 86, temperature 97.7 F (36.5 C), temperature source Oral, resp. rate 16, height 6' (1.829 m), weight 87.998 kg (194 lb), SpO2 98.00%. Current Medications: Current Facility-Administered Medications  Medication Dose Route Frequency Provider Last Rate Last Dose  . acetaminophen (TYLENOL) tablet 650 mg  650 mg Oral Q6H PRN Curlene Labrum Readling, MD      . alum &  mag hydroxide-simeth (MAALOX/MYLANTA) 200-200-20 MG/5ML suspension 30 mL  30 mL Oral Q4H PRN Curlene Labrum Readling, MD      . amitriptyline (ELAVIL) tablet 50 mg  50 mg Oral QHS Mike Craze, MD   50 mg at 04/24/12 2118  . carbamazepine (TEGRETOL) tablet 200 mg  200 mg Oral QID Mike Craze, MD   200 mg at 04/25/12 1148  . chlordiazePOXIDE (LIBRIUM) capsule 25 mg  25 mg Oral Q6H PRN Mike Craze, MD   25 mg at 04/25/12 1027  . cloNIDine (CATAPRES - Dosed in mg/24 hr) patch 0.2 mg  0.2 mg Transdermal NOW Mike Craze, MD   0.2 mg at 04/22/12 1259  . dicyclomine (BENTYL) tablet 20 mg  20 mg Oral Q6H Curlene Labrum Readling, MD   20 mg at 04/25/12 0816  . dicyclomine (BENTYL) tablet 20 mg  20 mg Oral Q6H PRN Mike Craze, MD      . fentaNYL (DURAGESIC - dosed mcg/hr) patch 25 mcg  25 mcg Transdermal Q72H Mike Craze, MD   25 mcg at 04/24/12 0929  . gabapentin (NEURONTIN) capsule 300 mg  300 mg Oral QID Mike Craze, MD   300 mg at 04/25/12 1148  . hydrOXYzine (ATARAX/VISTARIL) tablet 25 mg  25 mg Oral Q6H PRN Mike Craze, MD  25 mg at 04/24/12 1422  . insulin aspart (novoLOG) injection 0-15 Units  0-15 Units Subcutaneous TID WC Sanjuana Kava, NP      . insulin aspart (novoLOG) injection 4 Units  4 Units Subcutaneous TID WC Sanjuana Kava, NP      . lisinopril (PRINIVIL,ZESTRIL) tablet 20 mg  20 mg Oral BH-q7a Curlene Labrum Readling, MD   20 mg at 04/25/12 0616  . loperamide (IMODIUM) capsule 2-4 mg  2-4 mg Oral PRN Mike Craze, MD      . magnesium hydroxide (MILK OF MAGNESIA) suspension 30 mL  30 mL Oral Daily PRN Curlene Labrum Readling, MD      . meloxicam (MOBIC) tablet 7.5 mg  7.5 mg Oral BID Mike Craze, MD   7.5 mg at 04/25/12 0817  . metFORMIN (GLUCOPHAGE) tablet 500 mg  500 mg Oral BID WC Curlene Labrum Readling, MD   500 mg at 04/25/12 0816  . methocarbamol (ROBAXIN) tablet 1,000 mg  1,000 mg Oral Q8H PRN Mike Craze, MD   500 mg at 04/24/12 2000  . naproxen (NAPROSYN) tablet 500 mg  500 mg Oral  BID PRN Mike Craze, MD   500 mg at 04/22/12 1714  . nicotine (NICODERM CQ - dosed in mg/24 hours) patch 21 mg  21 mg Transdermal Daily Mike Craze, MD   21 mg at 04/25/12 4540  . ondansetron (ZOFRAN-ODT) disintegrating tablet 4 mg  4 mg Oral Q6H PRN Mike Craze, MD   4 mg at 04/25/12 1027  . predniSONE (DELTASONE) tablet 40 mg  40 mg Oral Q breakfast Mike Craze, MD   40 mg at 04/25/12 9811   Followed by  . predniSONE (DELTASONE) tablet 20 mg  20 mg Oral Q breakfast Mike Craze, MD      . QUEtiapine (SEROQUEL) tablet 12.5-25 mg  12.5-25 mg Oral TID Mike Craze, MD      . QUEtiapine (SEROQUEL) tablet 200 mg  200 mg Oral QHS Mike Craze, MD   200 mg at 04/24/12 1823    Lab Results:  Results for orders placed during the hospital encounter of 04/21/12 (from the past 48 hour(s))  GLUCOSE, CAPILLARY     Status: Abnormal   Collection Time   04/23/12  5:35 PM      Component Value Range Comment   Glucose-Capillary 254 (*) 70 - 99 mg/dL    Comment 1 Notify RN     GLUCOSE, CAPILLARY     Status: Abnormal   Collection Time   04/23/12  9:58 PM      Component Value Range Comment   Glucose-Capillary 279 (*) 70 - 99 mg/dL    Comment 1 Notify RN      Comment 2 Documented in Chart     CARBAMAZEPINE LEVEL, TOTAL     Status: Normal   Collection Time   04/24/12  6:15 AM      Component Value Range Comment   Carbamazepine Lvl 8.2  4.0 - 12.0 ug/mL   GLUCOSE, CAPILLARY     Status: Abnormal   Collection Time   04/24/12  6:16 AM      Component Value Range Comment   Glucose-Capillary 171 (*) 70 - 99 mg/dL    Comment 1 Notify RN      Comment 2 Documented in Chart     GLUCOSE, CAPILLARY     Status: Abnormal   Collection Time   04/24/12 12:04 PM  Component Value Range Comment   Glucose-Capillary 274 (*) 70 - 99 mg/dL    Comment 1 Notify RN     GLUCOSE, CAPILLARY     Status: Abnormal   Collection Time   04/24/12  4:56 PM      Component Value Range Comment   Glucose-Capillary 312  (*) 70 - 99 mg/dL    Comment 1 Notify RN     GLUCOSE, CAPILLARY     Status: Abnormal   Collection Time   04/24/12  9:32 PM      Component Value Range Comment   Glucose-Capillary 292 (*) 70 - 99 mg/dL    Comment 1 Notify RN     GLUCOSE, CAPILLARY     Status: Abnormal   Collection Time   04/25/12  5:55 AM      Component Value Range Comment   Glucose-Capillary 258 (*) 70 - 99 mg/dL    Comment 1 Notify RN     GLUCOSE, CAPILLARY     Status: Abnormal   Collection Time   04/25/12 11:46 AM      Component Value Range Comment   Glucose-Capillary 351 (*) 70 - 99 mg/dL     Physical Findings: AIMS: Facial and Oral Movements Muscles of Facial Expression: None, normal Lips and Perioral Area: None, normal Jaw: None, normal Tongue: None, normal,Extremity Movements Upper (arms, wrists, hands, fingers): None, normal Lower (legs, knees, ankles, toes): None, normal, Trunk Movements Neck, shoulders, hips: None, normal, Overall Severity Severity of abnormal movements (highest score from questions above): None, normal Incapacitation due to abnormal movements: None, normal Patient's awareness of abnormal movements (rate only patient's report): No Awareness, Dental Status Current problems with teeth and/or dentures?: No Does patient usually wear dentures?: No  CIWA:  CIWA-Ar Total: 1  COWS:     Treatment Plan Summary: Daily contact with patient to assess and evaluate symptoms and progress in treatment Medication management  Plan: Initiate Glycemic control protocol. Obtain hemoglobin AC  Charnele Semple I 04/25/2012, 2:15 PM

## 2012-04-25 NOTE — Progress Notes (Signed)
[facility] Group Notes:  (Counselor/Nursing/MHT/Case Management/Adjunct)  04/25/2012 1:58 AM  Type of Therapy:  Psychoeducational Skills  Participation Level:  Active  Participation Quality:  Attentive  Affect:  Anxious  Cognitive:  Appropriate  Insight:  Good  Engagement in Group:  Good  Engagement in Therapy:  Good  Modes of Intervention:  Education  Summary of Progress/Problems: The patient verbalized that he had a good morning, but that his afternoon did not work out well. He states that he doesn't feel that he'[name] ready for discharge. He feels that if he were discharged today, then he would do something that he will regret and that he prefers to stay in the hospital until his medications are regulated. His goal for tomorrow is to continue to attend groups and to focus on doing positive things with his time.    Matthew [name] 04/25/2012, 1:58 AM

## 2012-04-25 NOTE — Progress Notes (Signed)
Psychoeducational Group Note  Date: 04/25/2012 Time:  1015  Group Topic/Focus:  Identifying Needs:   The focus of this group is to help patients identify their personal needs that have been historically problematic and identify healthy behaviors to address their needs.  Participation Level:  Active  Participation Quality:  Appropriate  Affect:  Appropriate  Cognitive:  Appropriate  Insight:  Good  Engagement in Group:  Good  Additional Comments:    Dione Housekeeper

## 2012-04-25 NOTE — Progress Notes (Signed)
Patient ID: Matthew [name], male   DOB: 09-27-1975, 36 y.o.   MRN: [id number]  Pt did not attend aftercare planning group.

## 2012-04-25 NOTE — Progress Notes (Addendum)
D)Pt upset this morning and refusing to go to the groups "I am nauseated. That doctor is changing all my medications and he is not even telling me what he is doing.".  Pt raising his voice and agitated. Requesting something for his anxiety as well as his nausea. Verbalizing concerns, when he was able to calm down, about all the medication changed and not understanding the medications that he is on. Also concerned that his CBG's are high and he is not being covered with medications. States that this happended before and his Glucophage was increased to 1000 mg BID and that helped bring it back down. Rates his depression at a 3 and his hopelessness at a 2. Denies SI and HI. A) Pt given education about his medications and talked with the NP who has placed Pt on a sliding scale to be reevaluated  The beginning of next week. Also a HA1C was ordered. Given support, reassurance and praise.  R) Pt was able to calm and his nausea decreased after his prn's were given. Was able to attend the groups, gather himself together and correct himself. Has been appropriate the rest of the shift.

## 2012-04-26 DIAGNOSIS — F1920 Other psychoactive substance dependence, uncomplicated: Secondary | ICD-10-CM

## 2012-04-26 DIAGNOSIS — F1994 Other psychoactive substance use, unspecified with psychoactive substance-induced mood disorder: Principal | ICD-10-CM

## 2012-04-26 LAB — GLUCOSE, CAPILLARY
Glucose-Capillary: 313 mg/dL — ABNORMAL HIGH (ref 70–99)
Glucose-Capillary: 327 mg/dL — ABNORMAL HIGH (ref 70–99)
Glucose-Capillary: 496 mg/dL — ABNORMAL HIGH (ref 70–99)
Glucose-Capillary: 390 mg/dL — ABNORMAL HIGH (ref 70–99)

## 2012-04-26 MED ORDER — INSULIN ASPART 100 UNIT/ML ~~LOC~~ SOLN
15.0000 [IU] | Freq: Once | SUBCUTANEOUS | Status: AC
  Administered 2012-04-26: 15 [IU] via SUBCUTANEOUS

## 2012-04-26 MED ORDER — FENTANYL 25 MCG/HR TD PT72
25.0000 ug | MEDICATED_PATCH | TRANSDERMAL | Status: DC
  Administered 2012-04-26: 25 ug via TRANSDERMAL
  Filled 2012-04-26: qty 1

## 2012-04-26 NOTE — Progress Notes (Signed)
Psychoeducational Group Note  Date:  04/26/2012 Time:  1515  Group Topic/Focus:  Crisis Planning:   The purpose of this group is to help patients create a crisis plan for use upon discharge or in the future, as needed.  Participation Level:  Active  Participation Quality:  Appropriate, Redirectable and Sharing  Affect:  Appropriate  Cognitive:  Appropriate  Insight:  Good  Engagement in Group:  Good  Additional Comments:  Pt was loud talking, staff had to tell pt to be respectful and let others talk. Pt listened.  Matthew [name] M 04/26/2012, 7:44 PM

## 2012-04-26 NOTE — Progress Notes (Signed)
D) Pt has attended the program today. Upset most of the day for several reasons. Called his brother to come and pick him up today but Pt was not scheduled for discharge and was told that by several people. Was able to settle down in the afternoon. Will get on the phone and yell at his girlfriend for going to a party last night. Feels threatened that she is going to cheat on him. States he really loves her. Pt's Duragesic patch had fallen off of him and was dried up. Has requested to have a replacement (one is due tomorrow morning). Rates his depression and hopelessness both at a 1 and denies SI and HI. Upset over nighttime medication being switched to 2200. A) Given support and active listening. Encouraged to help himself calm down and start thinking. Seroquel switched back to 1800 as requested by the Pt. It was also explained to the Pt that this writer was the one who switched it to the later time due to it being a QHS med. Pt. Given an apology. R) Pt is aware that he can speak with the doctor tomorrow about discharge. States he is ready to leave.

## 2012-04-26 NOTE — Progress Notes (Signed)
Patient ID: Matthew [name], male   DOB: 28-Feb-1976, 36 y.o.   MRN: [id number]   [facility] Group Notes:  (Counselor/Nursing/MHT/Case Management/Adjunct)  04/26/2012 1:15 PM  Type of Therapy:  Group Therapy, Dance/Movement Therapy   Participation Level:  Active  Participation Quality:  Appropriate, Drowsy and Sharing  Affect:  Appropriate  Cognitive:  Appropriate  Insight:  Good  Engagement in Group:  Good  Engagement in Therapy:  Good  Modes of Intervention:  Clarification, Problem-solving, Role-play, Socialization and Support  Summary of Progress/Problems: Therapist and group members discussed healthy support systems and the importance of figuring out who we are and what we need. Group members shared experiences and ways they feel supported. Pt shared that he was angry earlier and how he was able calm himself down by laying in his room and looking at a book.     Cassidi Long 04/26/2012. 3:04 PM

## 2012-04-26 NOTE — Progress Notes (Signed)
Psychoeducational Group Note  Date:  04/26/2012 Time:  1015  Group Topic/Focus:  Making Healthy Choices:   The focus of this group is to help patients identify negative/unhealthy choices they were using prior to admission and identify positive/healthier coping strategies to replace them upon discharge.  Participation Level:  Active  Participation Quality:  Appropriate  Affect:  Appropriate  Cognitive:  Alert  Insight:  Good  Engagement in Group:  Good  Additional Comments:  Participated in the discussion and paid attention. Charletta Cousin 04/26/2012

## 2012-04-26 NOTE — Progress Notes (Signed)
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  Hypertension    .  Diabetes mellitus    .  Degenerative disc disease    .  Depression      bipolar   Axis IV: other psychosocial or environmental problems  Axis V: 21-30 behavior considerably influenced by delusions or hallucinations OR serious impairment in judgment, communication OR inability to function in almost all areas   ADL's:  Intact  Sleep:  No "not at all last night."  Appetite:?  Suicidal Ideation: No suicidal ideation, no plan, no intent, no means.  Homicidal Ideation:  No homicidal ideation, no plan, no intent, no means.  Subjective:  Matthew [name] had quite the morning.  He decided that he was ready to leave and called his brother to come and get him.  He stated he was ready to go home and that he was leaving AMA if he had to, as he was told he could do so by someone who checked him in.  He did not discuss this with provider yesterday or Friday.  He is not on the list to go home from [facility].   height is 6' (1.829 m) and weight is 87.998 kg (194 lb). His oral temperature is 97.6 F (36.4 C). His blood pressure is 131/90 and his pulse is 97. His respiration is 16 and oxygen saturation is 98%.   Objective: Notes indicated that the patient was upset over his girlfriend attending a party without him and it is likely that he wants to go home and address this with her.   Mental Status: Level of Consciousness:  Alert Orientation: x 3 General Appearance : anxious Behavior:  cooperative Eye Contact:  Fair Motor Behavior:  Restlestness Speech:  Pressured Mood:  anxious Affect:  anxious Anxiety Level:  Denies "unless you all let me go home." Thought Process:  Circumstantial Thought Content:  WNL Perception:   Normal Judgment:  Poor Insight:  Present Cognition:  average Sleep:  Number of Hours: 5.25   Lab Results:  Results for orders placed during the hospital encounter of 04/21/12 (from the past 48 hour(s))  GLUCOSE, CAPILLARY     Status: Abnormal   Collection Time   04/24/12  4:56 PM      Component Value Range Comment   Glucose-Capillary 312 (*) 70 - 99 mg/dL    Comment 1 Notify RN     GLUCOSE, CAPILLARY     Status: Abnormal   Collection Time   04/24/12  9:32 PM      Component Value Range Comment   Glucose-Capillary 292 (*) 70 - 99 mg/dL    Comment 1 Notify RN     GLUCOSE, CAPILLARY     Status: Abnormal   Collection Time   04/25/12  5:55 AM      Component Value Range Comment   Glucose-Capillary 258 (*) 70 - 99 mg/dL    Comment 1 Notify RN     GLUCOSE, CAPILLARY     Status: Abnormal   Collection Time   04/25/12 11:46 AM      Component Value Range Comment   Glucose-Capillary 351 (*) 70 - 99 mg/dL   GLUCOSE, CAPILLARY     Status: Abnormal   Collection Time   04/25/12  5:25 PM  Component Value Range Comment   Glucose-Capillary 343 (*) 70 - 99 mg/dL   GLUCOSE, CAPILLARY     Status: Abnormal   Collection Time   04/25/12  8:54 PM      Component Value Range Comment   Glucose-Capillary 317 (*) 70 - 99 mg/dL    Comment 1 Notify RN     GLUCOSE, CAPILLARY     Status: Abnormal   Collection Time   04/26/12  6:25 AM      Component Value Range Comment   Glucose-Capillary 234 (*) 70 - 99 mg/dL   GLUCOSE, CAPILLARY     Status: Abnormal   Collection Time   04/26/12 11:57 AM      Component Value Range Comment   Glucose-Capillary 313 (*) 70 - 99 mg/dL    Labs are reviewed. Physical Findings: AIMS: Facial and Oral Movements Muscles of Facial Expression: None, normal Lips and Perioral Area: None, normal Jaw: None, normal Tongue: None, normal,Extremity Movements Upper (arms, wrists, hands, fingers): None, normal Lower (legs, knees, ankles, toes): None, normal, Trunk Movements Neck,  shoulders, hips: None, normal, Overall Severity Severity of abnormal movements (highest score from questions above): None, normal Incapacitation due to abnormal movements: None, normal Patient's awareness of abnormal movements (rate only patient's report): No Awareness, Dental Status Current problems with teeth and/or dentures?: No Does patient usually wear dentures?: No  CIWA:  CIWA-Ar Total: 1  COWS:    Medication:   . amitriptyline  50 mg Oral QHS  . carbamazepine  200 mg Oral QID  . cloNIDine  0.2 mg Transdermal NOW  . dicyclomine  20 mg Oral Q6H  . fentaNYL  25 mcg Transdermal Q72H  . gabapentin  300 mg Oral QID  . insulin aspart  0-15 Units Subcutaneous TID WC  . insulin aspart  4 Units Subcutaneous TID WC  . lisinopril  20 mg Oral BH-q7a  . meloxicam  7.5 mg Oral BID  . metFORMIN  500 mg Oral BID WC  . nicotine  21 mg Transdermal Daily  . predniSONE  20 mg Oral Q breakfast  . QUEtiapine  12.5-25 mg Oral TID  . QUEtiapine  200 mg Oral QHS  . DISCONTD: fentaNYL  25 mcg Transdermal Q72H    Treatment Plan Summary: Daily contact with patient to assess and evaluate symptoms and progress in treatment Medication management  Plan: 1. Discussed this situation x 2 at length with the patient and explained again that leaving today is not an option as he is not well known to any of the providers working today, his blood sugars are 300+, and he is not sleeping.  He quickly changes his story as he thinks that it will be the definative response to be dishcarged. 2. This provider elected to err on the side of safety and he can determin if he is ready to leave tomorrow when he works with the CM and MD tomorrow to discuss his treatment plan. 3. All reviewed medications to check on the  Timing of dosing. 4. Will follow labs. Rona Ravens. Shanetta Nicolls PAC 04/26/2012, 4:23 PM

## 2012-04-26 NOTE — Progress Notes (Signed)
Patient has been up and active on the unit. Patient attended group and reports that when he is not drowsy from his medications he enjoys and participates in group. Patient spoke with Clinical research associate before group and requested that his 2200 dose of seroquel be given at 6 pm because when he takes it that late it makes him very drowsy in the am. Writer informed patient that the earliest it could be given would be 2100 and patient reported that he did not want to take it. Writer encouraged patient to speak with his doctor about changing the time of his evening dose of seroquel. Patient continued to  Say that he wanted to be discharged on tomorrow. Patient observed on the phone upset with his girlfriend because she was at a party and did not tell him. Patient reports that he is signing out tomorrow. Writer explained that this is a decision the doctor has to make and he would need to talk with him. Patient offered support and encouragement, safety maintained on unit, will continue to monitor.

## 2012-04-26 NOTE — Progress Notes (Signed)
[facility] Group Notes:  (Counselor/Nursing/MHT/Case Management/Adjunct)  04/26/2012 12:55 AM  Type of Therapy:  Psychoeducational Skills  Participation Level:  Active  Participation Quality:  Monopolizing  Affect:  Anxious  Cognitive:  Alert  Insight:  Limited  Engagement in Group:  Limited  Engagement in Therapy:  Limited  Modes of Intervention:  Education  Summary of Progress/Problems: The patient spoke at great length during group. On the one hand, he stated that he had a good day, then he acknowledged that he had a bad telephone call. In addition, he mentioned that he learned a great deal from the groups. He then complained at great length that he felt like a "zombie" this morning when he attended group. He attributes this to the medication that he took at 6:00am.  He requested that his doctor be contacted by telephone to adjust the schedule and type of medication for him. The nurse encouraged him to speak with his doctor in the morning regarding these concerns. He refused and instead made the claim that he wasn't getting anywhere by being in the hospital. He repeatedly verbalized that he was on the wrong medication and that his doctor needed to be called. His goal for tomorrow is to attend more groups.    Hazle Coca S 04/26/2012, 12:55 AM

## 2012-04-26 NOTE — Progress Notes (Signed)
Patient ID: Matthew [name], male   DOB: [birth year], 36 y.o.   MRN: [id number]  Pt. attended and participated in aftercare planning group. Pt. verbally accepted information on suicide prevention, warning signs to look for with suicide and crisis line numbers to use. Pt was agitated this morning about change in medication but remained calm. Pt shared that he is ready to leave and would like to see a doctor. No S/I or H/I.

## 2012-04-27 LAB — GLUCOSE, CAPILLARY: Glucose-Capillary: 263 mg/dL — ABNORMAL HIGH (ref 70–99)

## 2012-04-27 MED ORDER — QUETIAPINE FUMARATE 25 MG PO TABS: 25.0000 mg | ORAL_TABLET | Freq: Three times a day (TID) | ORAL | Status: DC

## 2012-04-27 MED ORDER — GABAPENTIN 300 MG PO CAPS: 300.0000 mg | ORAL_CAPSULE | Freq: Four times a day (QID) | ORAL | Status: DC

## 2012-04-27 MED ORDER — LISINOPRIL 20 MG PO TABS: 20.0000 mg | ORAL_TABLET | Freq: Every day | ORAL | Status: DC

## 2012-04-27 MED ORDER — MELOXICAM 7.5 MG PO TABS: 7.5000 mg | ORAL_TABLET | Freq: Two times a day (BID) | ORAL | Status: DC

## 2012-04-27 MED ORDER — FENTANYL 25 MCG/HR TD PT72: 1.0000 | MEDICATED_PATCH | TRANSDERMAL | Status: DC

## 2012-04-27 MED ORDER — AMITRIPTYLINE HCL 50 MG PO TABS: 50.0000 mg | ORAL_TABLET | Freq: Every day | ORAL | Status: DC

## 2012-04-27 MED ORDER — CARBAMAZEPINE 200 MG PO TABS: 200.0000 mg | ORAL_TABLET | Freq: Four times a day (QID) | ORAL | Status: DC

## 2012-04-27 MED ORDER — METFORMIN HCL 500 MG PO TABS: 500.0000 mg | ORAL_TABLET | Freq: Two times a day (BID) | ORAL | Status: DC

## 2012-04-27 MED ORDER — NICOTINE 21 MG/24HR TD PT24: 1.0000 | MEDICATED_PATCH | Freq: Every day | TRANSDERMAL | Status: DC

## 2012-04-27 MED ORDER — QUETIAPINE FUMARATE 200 MG PO TABS: 200.0000 mg | ORAL_TABLET | Freq: Every day | ORAL | Status: DC

## 2012-04-27 NOTE — Progress Notes (Signed)
I agree with this note on this date. 

## 2012-04-27 NOTE — Progress Notes (Signed)
Group Note  Date:  04/27/2012 Time:  1:15  Group Topic/Focus:  Overcoming obstacles to wellness  Additional Comments:  Did not attend group.  Prabhnoor Ellenberger S 04/27/2012, 2:59 PM

## 2012-04-27 NOTE — Progress Notes (Signed)
[facility] Case Management Discharge Plan:  Will you be returning to the same living situation after discharge: Yes,  Patient to return to his home At discharge, do you have transportation home?:Yes,  Patient to arrange his own transportation Do you have the ability to pay for your medications:No.  Patient to be assisted with indigent medications  Interagency Information:     Release of information consent forms completed and in the chart;  Patient's signature needed at discharge.  Patient to Follow up at:  Follow-up Information    Follow up with [facility]. On 05/08/2012. (Patient has an appoitment with Dr. Toni Arthurs on Friday, May 08, 2012 at 1pm.)    Contact information:   [address] [place], [place]     [phone]      Follow up with [facility]. (Patient has an appointment with Dr. Toy Cookey on Wednesday, October 2, at 1:45pm.)    Contact information:   [address] [place], [place]   [phone]         Patient denies SI/HI:   Yes,  Patient no longer endorsing SI/HI or other thoughts of self harm    Safety Planning and Suicide Prevention discussed:  Yes,  Reviewed during aftercare groups  Barrier to discharge identified:No.  Summary and Recommendations: Patient encourage to be compliant with medications and follow up with outpatient recommedations    Lonisha Bobby, Joesph July 04/27/2012, 10:53 AM

## 2012-04-27 NOTE — Progress Notes (Signed)
Patient has been in his room asleep and did not attend group. Patient observed by mht to be very drowsy and his blood sugar was 496 when taken by mht. Writer entered patients room and he was returning to bed after using the bathroom and he appeared very drowsy and somewhat unsteady on his feet.  Patient reported that he had just eaten 2 containers of ice cream and they were on his night stand in his room. Writer spoke with D. Cedric Fishman N.P. and received an order to follow novolog sliding scale patient has in place and re-check in an hour. Vitals were taken bp 135/87, pulse 99 sitting, standing bp 125/84, pulse 98. Patient received 15 units of novolog as a one time order and his scheduled hs meds of tegretol and elavil. CBG was rechecked and it was 390. Patient has been sitting up in the dayroom interacting with select peers and appears to be less drowsy. Patient denies having pain, -si/hi/a/v hall. Safety maintained on unit, will continue to monitor.

## 2012-04-27 NOTE — Progress Notes (Signed)
Psychoeducational Group Note  Date:  04/27/2012 Time:  1100  Group Topic/Focus:  Self Care:   The focus of this group is to help patients understand the importance of self-care in order to improve or restore emotional, physical, spiritual, interpersonal, and financial health.  Participation Level:  Active  Participation Quality:  Appropriate  Affect:  Appropriate  Cognitive:  Appropriate  Insight:  Good  Engagement in Group:  Good  Additional Comments:  Pt participated in group.  Marquis Lunch, Atom Solivan 04/27/2012, 2:52 PM

## 2012-04-27 NOTE — Progress Notes (Signed)
Pt. Denies SI/HI.  Discharge instructions reviewed with pt.  Pt. Verbalized an understanding of discharge instructions. Pt. Discharged and escorted from unit by Clinical research associate.  Encouragement and support given.  Pt. Receptive.

## 2012-04-27 NOTE — Tx Team (Signed)
Interdisciplinary Treatment Plan Update (Adult)  Date:  04/27/2012  Time Reviewed:  10:16 AM   Progress in Treatment: Attending groups:   Yes   Participating in groups:  Yes Taking medication as prescribed:  Yes Tolerating medication:  Yes Family/Significant othe contact made: Contact made with family Patient understands diagnosis:  Yes Discussing patient identified problems/goals with staff: Yes Medical problems stabilized or resolved: Yes Denies suicidal/homicidal ideation:Yes Issues/concerns per patient self-inventory:  Other:  New problem(s) identified:  Reason for Continuation of Hospitalization:  Interventions implemented related to continuation of hospitalization:  Additional comments:  Estimated length of stay: Discharge home today  Discharge Plan: Home with outpatient follow up  New goal(s):  Review of initial/current patient goals per problem list:    1.  Goal(s): Eliminate SI/other thoughts of self harm   Met:  Yes  Target date: d/c  As evidenced by: Patient no longer endorsing SI/HI or other thoughts of self harm.    2.  Goal (s): Reduce depression/anxiety (rated at zero and two)   Met:  Yes  Target date: d/c  As evidenced by: Patient currently rating symptoms at four or below    3.  Goal(s): .stabilize on meds   Met:  Yes  Target date: d/c  As evidenced by: Patient reports being stabilized on medications - less symptomatic    4.  Goal(s):  .stabilize on meds   Met:  Yes  Target date: d/c  As evidenced by: Patient reports being stabilized on medications - less symptomatic    Attendees: Patient:  Matthew [name] 04/27/2012 10:16 AM  Nursing:  Neill Loft, RN 04/27/2012 10:24 AM  Physician:  Orson Aloe, MD 04/27/2012 10:16 AM   Nursing:   Chinita Greenland, RN 04/27/2012 10:16 AM   CaseManager:  Juline Patch, LCSW 04/27/2012 10:16 AM   Counselor:  Angus Palms, LCSW 04/27/2012 10:16 AM   Other:  Kathlee Nations, RN      04/27/2012  10:25 AM

## 2012-04-27 NOTE — Progress Notes (Signed)
Psychoeducational Group Note  Date:  04/27/2012 Time:  2000  Group Topic/Focus:  Wrap-Up Group:   The focus of this group is to help patients review their daily goal of treatment and discuss progress on daily workbooks.  Participation Level: Did Not Attend  Participation Quality:  Not Applicable  Affect:  Not Applicable  Cognitive:  Not Applicable  Insight:  Not Applicable  Engagement in Group: Not Applicable  Additional Comments:  The patient did not attend group since he was asleep at the time.   Hyun Reali S 04/27/2012, 1:14 AM
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
## 2012-04-28 NOTE — Progress Notes (Signed)
Patient Discharge Instructions:  After Visit Summary (AVS):   Access to EMR:  04/28/2012 Psychiatric Admission Assessment Note:   Access to EMR:  04/28/2012 Suicide Risk Assessment - Discharge Assessment:   Access to EMR:  04/28/2012 Next Level Care Provider Has Access to the EMR, 04/28/2012  Records provided to [facility] via [facility] access.  Wandra Scot, 04/28/2012, 11:53 AM

## 2012-04-29 ENCOUNTER — Ambulatory Visit (HOSPITAL_COMMUNITY): Payer: Self-pay | Admitting: Psychology

## 2012-05-08 ENCOUNTER — Other Ambulatory Visit (HOSPITAL_COMMUNITY): Payer: Self-pay | Admitting: Internal Medicine

## 2012-05-08 ENCOUNTER — Ambulatory Visit (INDEPENDENT_AMBULATORY_CARE_PROVIDER_SITE_OTHER): Payer: 59 | Admitting: Psychiatry

## 2012-05-08 ENCOUNTER — Encounter (HOSPITAL_COMMUNITY): Payer: Self-pay | Admitting: Psychiatry

## 2012-05-08 DIAGNOSIS — F319 Bipolar disorder, unspecified: Secondary | ICD-10-CM

## 2012-05-08 DIAGNOSIS — R1032 Left lower quadrant pain: Secondary | ICD-10-CM

## 2012-05-08 MED ORDER — FENTANYL 25 MCG/HR TD PT72: 1.0000 | MEDICATED_PATCH | TRANSDERMAL | Status: DC

## 2012-05-08 MED ORDER — CARBAMAZEPINE 200 MG PO TABS: 200.0000 mg | ORAL_TABLET | Freq: Four times a day (QID) | ORAL | Status: DC

## 2012-05-08 MED ORDER — GABAPENTIN 300 MG PO CAPS: 300.0000 mg | ORAL_CAPSULE | Freq: Four times a day (QID) | ORAL | Status: DC

## 2012-05-08 MED ORDER — QUETIAPINE FUMARATE 200 MG PO TABS: 200.0000 mg | ORAL_TABLET | Freq: Every day | ORAL | Status: DC

## 2012-05-08 MED ORDER — QUETIAPINE FUMARATE 25 MG PO TABS: 25.0000 mg | ORAL_TABLET | Freq: Three times a day (TID) | ORAL | Status: DC

## 2012-05-08 MED ORDER — AMITRIPTYLINE HCL 50 MG PO TABS: 50.0000 mg | ORAL_TABLET | Freq: Every day | ORAL | Status: DC

## 2012-05-08 NOTE — Addendum Note (Signed)
Addended by: Toni Arthurs, Demario Faniel L on: 05/08/2012 01:26 PM   Modules accepted: Orders

## 2012-05-08 NOTE — Progress Notes (Signed)
Patient ID: Matthew [name], male   DOB: Dec 06, 1975, 36 y.o.   MRN: [id number] Admitted to hospital 2 weeks ago.  Feels better now.  But was suicidal at that time.  Did not act on impulse to hang himself.  Had meds changed.  Stopping nicotine.  Feels much better now.  More clear here.  Not so depressed.  Sleeping well; alert during the day with no pain.

## 2012-05-11 ENCOUNTER — Ambulatory Visit (HOSPITAL_COMMUNITY): Payer: Medicaid Other

## 2012-05-25 ENCOUNTER — Telehealth (HOSPITAL_COMMUNITY): Payer: Self-pay | Admitting: *Deleted

## 2012-05-29 ENCOUNTER — Ambulatory Visit (HOSPITAL_COMMUNITY)
Admission: RE | Admit: 2012-05-29 | Discharge: 2012-05-29 | Disposition: A | Discharge status: Home / Self Care | Payer: Medicaid Other | Source: Ambulatory Visit | Attending: Internal Medicine | Admitting: Internal Medicine

## 2012-05-29 DIAGNOSIS — R1032 Left lower quadrant pain: Secondary | ICD-10-CM | POA: Insufficient documentation

## 2012-05-29 MED ORDER — IOHEXOL 300 MG/ML  SOLN
100.0000 mL | Freq: Once | INTRAMUSCULAR | Status: AC | PRN
  Administered 2012-05-29: 100 mL via INTRAVENOUS

## 2012-06-01 ENCOUNTER — Other Ambulatory Visit (HOSPITAL_COMMUNITY): Payer: Self-pay | Admitting: Internal Medicine

## 2012-06-01 DIAGNOSIS — I861 Scrotal varices: Secondary | ICD-10-CM

## 2012-06-03 ENCOUNTER — Other Ambulatory Visit: Payer: Self-pay

## 2012-06-03 ENCOUNTER — Inpatient Hospital Stay (HOSPITAL_COMMUNITY)
Admission: EM | Admit: 2012-06-03 | Discharge: 2012-06-07 | DRG: 917 | Disposition: A | Discharge status: Home / Self Care | Payer: Medicaid Other | Attending: Internal Medicine | Admitting: Internal Medicine

## 2012-06-03 ENCOUNTER — Encounter (HOSPITAL_COMMUNITY): Payer: Self-pay

## 2012-06-03 DIAGNOSIS — E872 Acidosis, unspecified: Secondary | ICD-10-CM | POA: Diagnosis present

## 2012-06-03 DIAGNOSIS — F39 Unspecified mood [affective] disorder: Secondary | ICD-10-CM | POA: Diagnosis present

## 2012-06-03 DIAGNOSIS — E119 Type 2 diabetes mellitus without complications: Secondary | ICD-10-CM | POA: Diagnosis present

## 2012-06-03 DIAGNOSIS — IMO0002 Reserved for concepts with insufficient information to code with codable children: Secondary | ICD-10-CM

## 2012-06-03 DIAGNOSIS — T50901A Poisoning by unspecified drugs, medicaments and biological substances, accidental (unintentional), initial encounter: Secondary | ICD-10-CM | POA: Diagnosis present

## 2012-06-03 DIAGNOSIS — Z720 Tobacco use: Secondary | ICD-10-CM | POA: Diagnosis present

## 2012-06-03 DIAGNOSIS — F1910 Other psychoactive substance abuse, uncomplicated: Secondary | ICD-10-CM | POA: Diagnosis present

## 2012-06-03 DIAGNOSIS — T423X1A Poisoning by barbiturates, accidental (unintentional), initial encounter: Secondary | ICD-10-CM | POA: Diagnosis present

## 2012-06-03 DIAGNOSIS — J4489 Other specified chronic obstructive pulmonary disease: Secondary | ICD-10-CM | POA: Diagnosis present

## 2012-06-03 DIAGNOSIS — Z781 Physical restraint status: Secondary | ICD-10-CM | POA: Diagnosis present

## 2012-06-03 DIAGNOSIS — T405X1A Poisoning by cocaine, accidental (unintentional), initial encounter: Secondary | ICD-10-CM | POA: Diagnosis present

## 2012-06-03 DIAGNOSIS — T423X4A Poisoning by barbiturates, undetermined, initial encounter: Secondary | ICD-10-CM | POA: Diagnosis present

## 2012-06-03 DIAGNOSIS — T424X4A Poisoning by benzodiazepines, undetermined, initial encounter: Secondary | ICD-10-CM | POA: Diagnosis present

## 2012-06-03 DIAGNOSIS — Z79899 Other long term (current) drug therapy: Secondary | ICD-10-CM

## 2012-06-03 DIAGNOSIS — E876 Hypokalemia: Secondary | ICD-10-CM | POA: Diagnosis not present

## 2012-06-03 DIAGNOSIS — F339 Major depressive disorder, recurrent, unspecified: Secondary | ICD-10-CM

## 2012-06-03 DIAGNOSIS — F1920 Other psychoactive substance dependence, uncomplicated: Secondary | ICD-10-CM

## 2012-06-03 DIAGNOSIS — B1920 Unspecified viral hepatitis C without hepatic coma: Secondary | ICD-10-CM | POA: Diagnosis present

## 2012-06-03 DIAGNOSIS — T424X1A Poisoning by benzodiazepines, accidental (unintentional), initial encounter: Secondary | ICD-10-CM | POA: Diagnosis present

## 2012-06-03 DIAGNOSIS — Y92009 Unspecified place in unspecified non-institutional (private) residence as the place of occurrence of the external cause: Secondary | ICD-10-CM

## 2012-06-03 DIAGNOSIS — Z7289 Other problems related to lifestyle: Secondary | ICD-10-CM

## 2012-06-03 DIAGNOSIS — S40019A Contusion of unspecified shoulder, initial encounter: Secondary | ICD-10-CM

## 2012-06-03 DIAGNOSIS — I10 Essential (primary) hypertension: Secondary | ICD-10-CM | POA: Diagnosis present

## 2012-06-03 DIAGNOSIS — T426X1A Poisoning by other antiepileptic and sedative-hypnotic drugs, accidental (unintentional), initial encounter: Principal | ICD-10-CM | POA: Diagnosis present

## 2012-06-03 DIAGNOSIS — J449 Chronic obstructive pulmonary disease, unspecified: Secondary | ICD-10-CM | POA: Diagnosis present

## 2012-06-03 DIAGNOSIS — F3130 Bipolar disorder, current episode depressed, mild or moderate severity, unspecified: Secondary | ICD-10-CM | POA: Diagnosis present

## 2012-06-03 DIAGNOSIS — F411 Generalized anxiety disorder: Secondary | ICD-10-CM | POA: Diagnosis present

## 2012-06-03 DIAGNOSIS — Y249XXA Unspecified firearm discharge, undetermined intent, initial encounter: Secondary | ICD-10-CM

## 2012-06-03 DIAGNOSIS — F17200 Nicotine dependence, unspecified, uncomplicated: Secondary | ICD-10-CM | POA: Diagnosis present

## 2012-06-03 DIAGNOSIS — J9600 Acute respiratory failure, unspecified whether with hypoxia or hypercapnia: Secondary | ICD-10-CM | POA: Diagnosis not present

## 2012-06-03 DIAGNOSIS — R4182 Altered mental status, unspecified: Secondary | ICD-10-CM | POA: Diagnosis present

## 2012-06-03 DIAGNOSIS — J690 Pneumonitis due to inhalation of food and vomit: Secondary | ICD-10-CM | POA: Clinically undetermined

## 2012-06-03 DIAGNOSIS — D696 Thrombocytopenia, unspecified: Secondary | ICD-10-CM | POA: Diagnosis present

## 2012-06-03 DIAGNOSIS — T43601A Poisoning by unspecified psychostimulants, accidental (unintentional), initial encounter: Secondary | ICD-10-CM | POA: Diagnosis present

## 2012-06-03 DIAGNOSIS — E860 Dehydration: Secondary | ICD-10-CM | POA: Diagnosis present

## 2012-06-03 DIAGNOSIS — B1710 Acute hepatitis C without hepatic coma: Secondary | ICD-10-CM | POA: Diagnosis present

## 2012-06-03 HISTORY — DX: Unspecified viral hepatitis C without hepatic coma: B19.20

## 2012-06-03 HISTORY — DX: Other psychoactive substance abuse, uncomplicated: F19.10

## 2012-06-03 MED ORDER — SODIUM CHLORIDE 0.9 % IV SOLN
1000.0000 mL | Freq: Once | INTRAVENOUS | Status: AC
  Administered 2012-06-04: 1000 mL via INTRAVENOUS

## 2012-06-03 MED ORDER — SODIUM CHLORIDE 0.9 % IV SOLN
1000.0000 mL | INTRAVENOUS | Status: DC
  Administered 2012-06-04: 1000 mL via INTRAVENOUS

## 2012-06-03 MED ORDER — NALOXONE HCL 0.4 MG/ML IJ SOLN
0.4000 mg | Freq: Once | INTRAMUSCULAR | Status: AC
  Administered 2012-06-04: 0.4 mg via INTRAVENOUS
  Filled 2012-06-03: qty 1

## 2012-06-03 NOTE — ED Notes (Signed)
A tegretol was found on the couch beside him. Patient unresponsive at this time.
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
## 2012-06-03 NOTE — ED Notes (Signed)
MD aware and at bedside

## 2012-06-03 NOTE — ED Notes (Signed)
Patient rolled to view if fentanyl patch was in place anywhere. Back and chest clear of any type of patch.

## 2012-06-03 NOTE — ED Notes (Signed)
Call came out as an overdose. Person who was with him states that he drank half of a 40 OZ. Unknown what he has taken.

## 2012-06-04 ENCOUNTER — Encounter (HOSPITAL_COMMUNITY): Payer: Self-pay | Admitting: Internal Medicine

## 2012-06-04 ENCOUNTER — Inpatient Hospital Stay (HOSPITAL_COMMUNITY): Payer: Medicaid Other

## 2012-06-04 ENCOUNTER — Emergency Department (HOSPITAL_COMMUNITY): Payer: Medicaid Other

## 2012-06-04 DIAGNOSIS — F39 Unspecified mood [affective] disorder: Secondary | ICD-10-CM

## 2012-06-04 DIAGNOSIS — J9600 Acute respiratory failure, unspecified whether with hypoxia or hypercapnia: Secondary | ICD-10-CM | POA: Diagnosis not present

## 2012-06-04 DIAGNOSIS — T50901A Poisoning by unspecified drugs, medicaments and biological substances, accidental (unintentional), initial encounter: Secondary | ICD-10-CM

## 2012-06-04 DIAGNOSIS — E119 Type 2 diabetes mellitus without complications: Secondary | ICD-10-CM

## 2012-06-04 DIAGNOSIS — R4182 Altered mental status, unspecified: Secondary | ICD-10-CM

## 2012-06-04 DIAGNOSIS — E860 Dehydration: Secondary | ICD-10-CM | POA: Diagnosis present

## 2012-06-04 LAB — RAPID URINE DRUG SCREEN, HOSP PERFORMED
Barbiturates: POSITIVE — AB
Cocaine: POSITIVE — AB
Opiates: NOT DETECTED

## 2012-06-04 LAB — BLOOD GAS, ARTERIAL
Acid-Base Excess: 1.6 mmol/L (ref 0.0–2.0)
Acid-Base Excess: 1.9 mmol/L (ref 0.0–2.0)
Drawn by: 23534
MECHVT: 500 mL
O2 Content: 75 L/min
O2 Saturation: 99.3 %
Patient temperature: 37
Patient temperature: 37
RATE: 18 resp/min
TCO2: 25.2 mmol/L (ref 0–100)

## 2012-06-04 LAB — COMPREHENSIVE METABOLIC PANEL
ALT: 87 U/L — ABNORMAL HIGH (ref 0–53)
CO2: 28 mEq/L (ref 19–32)
Calcium: 9.1 mg/dL (ref 8.4–10.5)
Chloride: 98 mEq/L (ref 96–112)
Creatinine, Ser: 0.74 mg/dL (ref 0.50–1.35)
GFR calc Af Amer: >90 mL/min (ref >90)
GFR calc non Af Amer: >90 mL/min (ref >90)
Glucose, Bld: 226 mg/dL — ABNORMAL HIGH (ref 70–99)
Total Bilirubin: 0.3 mg/dL (ref 0.3–1.2)

## 2012-06-04 LAB — GLUCOSE, CAPILLARY: Glucose-Capillary: 179 mg/dL — ABNORMAL HIGH (ref 70–99)

## 2012-06-04 LAB — ETHANOL: Alcohol, Ethyl (B): <11 mg/dL (ref 0–11)

## 2012-06-04 LAB — CBC WITH DIFFERENTIAL/PLATELET
Eosinophils Relative: 0 % (ref 0–5)
HCT: 46.3 % (ref 39.0–52.0)
Hemoglobin: 16.4 g/dL (ref 13.0–17.0)
Lymphocytes Relative: 8 % — ABNORMAL LOW (ref 12–46)
Lymphs Abs: 1 10*3/uL (ref 0.7–4.0)
MCV: 85.4 fL (ref 78.0–100.0)
Monocytes Absolute: 0.7 10*3/uL (ref 0.1–1.0)
RBC: 5.42 MIL/uL (ref 4.22–5.81)
WBC: 12.5 10*3/uL — ABNORMAL HIGH (ref 4.0–10.5)

## 2012-06-04 LAB — URINALYSIS, ROUTINE W REFLEX MICROSCOPIC
Bilirubin Urine: NEGATIVE
Nitrite: NEGATIVE
Specific Gravity, Urine: >1.03 — ABNORMAL HIGH (ref 1.005–1.030)
Urobilinogen, UA: 0.2 mg/dL (ref 0.0–1.0)
pH: 5.5 (ref 5.0–8.0)

## 2012-06-04 LAB — HEMOGLOBIN A1C
Hgb A1c MFr Bld: 7.9 % — ABNORMAL HIGH (ref <5.7)
Mean Plasma Glucose: 180 mg/dL — ABNORMAL HIGH (ref <117)

## 2012-06-04 LAB — MRSA PCR SCREENING: MRSA by PCR: NEGATIVE

## 2012-06-04 LAB — ACETAMINOPHEN LEVEL: Acetaminophen (Tylenol), Serum: <15 ug/mL (ref 10–30)

## 2012-06-04 MED ORDER — METOPROLOL TARTRATE 1 MG/ML IV SOLN
5.0000 mg | Freq: Four times a day (QID) | INTRAVENOUS | Status: DC | PRN
  Administered 2012-06-04 – 2012-06-05 (×2): 5 mg via INTRAVENOUS
  Filled 2012-06-04 (×2): qty 5

## 2012-06-04 MED ORDER — AMPICILLIN-SULBACTAM SODIUM 1.5 (1-0.5) G IJ SOLR
INTRAMUSCULAR | Status: AC
  Filled 2012-06-04 (×3): qty 1.5

## 2012-06-04 MED ORDER — ENOXAPARIN SODIUM 40 MG/0.4ML ~~LOC~~ SOLN
40.0000 mg | SUBCUTANEOUS | Status: DC
  Administered 2012-06-04 – 2012-06-07 (×4): 40 mg via SUBCUTANEOUS
  Filled 2012-06-04 (×4): qty 0.4

## 2012-06-04 MED ORDER — THIAMINE HCL 100 MG/ML IJ SOLN
100.0000 mg | Freq: Every day | INTRAMUSCULAR | Status: DC
  Administered 2012-06-04 – 2012-06-05 (×2): 100 mg via INTRAVENOUS
  Filled 2012-06-04 (×2): qty 2

## 2012-06-04 MED ORDER — ONDANSETRON HCL 4 MG PO TABS: 4.0000 mg | ORAL_TABLET | Freq: Four times a day (QID) | ORAL | Status: DC | PRN

## 2012-06-04 MED ORDER — PANTOPRAZOLE SODIUM 40 MG IV SOLR
40.0000 mg | Freq: Every day | INTRAVENOUS | Status: DC
  Administered 2012-06-04 – 2012-06-05 (×2): 40 mg via INTRAVENOUS
  Filled 2012-06-04 (×2): qty 40

## 2012-06-04 MED ORDER — LIDOCAINE HCL (CARDIAC) 20 MG/ML IV SOLN
INTRAVENOUS | Status: AC
  Filled 2012-06-04: qty 5

## 2012-06-04 MED ORDER — SUCCINYLCHOLINE CHLORIDE 20 MG/ML IJ SOLN
INTRAMUSCULAR | Status: AC
  Administered 2012-06-04: 100 mg
  Filled 2012-06-04: qty 1

## 2012-06-04 MED ORDER — ACETAMINOPHEN 325 MG PO TABS
650.0000 mg | ORAL_TABLET | Freq: Four times a day (QID) | ORAL | Status: DC | PRN
  Administered 2012-06-05 – 2012-06-06 (×2): 650 mg via ORAL
  Filled 2012-06-04 (×2): qty 2

## 2012-06-04 MED ORDER — NALOXONE HCL 1 MG/ML IJ SOLN
2.0000 mg | Freq: Once | INTRAMUSCULAR | Status: AC
  Administered 2012-06-04: 2 mg via INTRAVENOUS
  Filled 2012-06-04: qty 4

## 2012-06-04 MED ORDER — ALBUTEROL SULFATE (5 MG/ML) 0.5% IN NEBU: 2.5000 mg | INHALATION_SOLUTION | RESPIRATORY_TRACT | Status: DC | PRN

## 2012-06-04 MED ORDER — CHLORHEXIDINE GLUCONATE 0.12 % MT SOLN
15.0000 mL | Freq: Two times a day (BID) | OROMUCOSAL | Status: DC
  Administered 2012-06-04 – 2012-06-05 (×3): 15 mL via OROMUCOSAL
  Filled 2012-06-04 (×3): qty 15

## 2012-06-04 MED ORDER — FENTANYL CITRATE 0.05 MG/ML IJ SOLN
25.0000 ug | INTRAMUSCULAR | Status: DC | PRN
  Administered 2012-06-05 (×2): 25 ug via INTRAVENOUS
  Filled 2012-06-04 (×2): qty 2

## 2012-06-04 MED ORDER — SODIUM CHLORIDE 0.9 % IJ SOLN
INTRAMUSCULAR | Status: AC
  Administered 2012-06-04: 10 mL
  Filled 2012-06-04: qty 3

## 2012-06-04 MED ORDER — SODIUM CHLORIDE 0.9 % IV SOLN
INTRAVENOUS | Status: AC
  Administered 2012-06-04 (×3): via INTRAVENOUS

## 2012-06-04 MED ORDER — ACETAMINOPHEN 650 MG RE SUPP: 650.0000 mg | Freq: Four times a day (QID) | RECTAL | Status: DC | PRN

## 2012-06-04 MED ORDER — PROPOFOL 10 MG/ML IV EMUL
5.0000 ug/kg/min | INTRAVENOUS | Status: DC
  Administered 2012-06-04 (×3): 45 ug/kg/min via INTRAVENOUS
  Administered 2012-06-04: 10 ug/kg/min via INTRAVENOUS
  Administered 2012-06-05: 50 ug/kg/min via INTRAVENOUS
  Administered 2012-06-05: 45 ug/kg/min via INTRAVENOUS
  Filled 2012-06-04 (×6): qty 100

## 2012-06-04 MED ORDER — PROPOFOL 10 MG/ML IV EMUL
INTRAVENOUS | Status: AC
  Administered 2012-06-04: 10 ug/kg/min via INTRAVENOUS
  Filled 2012-06-04: qty 100

## 2012-06-04 MED ORDER — INSULIN ASPART 100 UNIT/ML ~~LOC~~ SOLN
0.0000 [IU] | SUBCUTANEOUS | Status: DC
  Administered 2012-06-04: 1 [IU] via SUBCUTANEOUS
  Administered 2012-06-04: 2 [IU] via SUBCUTANEOUS
  Administered 2012-06-04: 1 [IU] via SUBCUTANEOUS
  Administered 2012-06-04: 2 [IU] via SUBCUTANEOUS
  Administered 2012-06-05 (×2): 1 [IU] via SUBCUTANEOUS
  Administered 2012-06-06: 2 [IU] via SUBCUTANEOUS
  Administered 2012-06-06: 1 [IU] via SUBCUTANEOUS
  Administered 2012-06-06: 2 [IU] via SUBCUTANEOUS

## 2012-06-04 MED ORDER — SODIUM CHLORIDE 0.9 % IJ SOLN
INTRAMUSCULAR | Status: AC
  Administered 2012-06-04: 08:00:00
  Filled 2012-06-04: qty 3

## 2012-06-04 MED ORDER — ETOMIDATE 2 MG/ML IV SOLN
INTRAVENOUS | Status: AC
  Administered 2012-06-04: 20 mg
  Filled 2012-06-04: qty 20

## 2012-06-04 MED ORDER — BIOTENE DRY MOUTH MT LIQD
15.0000 mL | Freq: Four times a day (QID) | OROMUCOSAL | Status: DC
  Administered 2012-06-04 – 2012-06-05 (×4): 15 mL via OROMUCOSAL

## 2012-06-04 MED ORDER — SODIUM CHLORIDE 0.9 % IV SOLN
1.5000 g | Freq: Four times a day (QID) | INTRAVENOUS | Status: DC
  Administered 2012-06-04 – 2012-06-06 (×7): 1.5 g via INTRAVENOUS
  Filled 2012-06-04 (×16): qty 1.5

## 2012-06-04 MED ORDER — NALOXONE HCL 0.4 MG/ML IJ SOLN
2.0000 mg | Freq: Once | INTRAMUSCULAR | Status: DC
  Filled 2012-06-04: qty 1
  Filled 2012-06-04: qty 5

## 2012-06-04 MED ORDER — ONDANSETRON HCL 4 MG/2ML IJ SOLN: 4.0000 mg | Freq: Four times a day (QID) | INTRAMUSCULAR | Status: DC | PRN

## 2012-06-04 MED ORDER — ROCURONIUM BROMIDE 50 MG/5ML IV SOLN
INTRAVENOUS | Status: AC
  Filled 2012-06-04: qty 2

## 2012-06-04 NOTE — Consult Note (Signed)
NAMEMASSIE, SILENCE                 ACCOUNT NO.:  [id number]  MEDICAL RECORD NO.:  [id number]  LOCATION:  IC04                          FACILITY:  [facility]  PHYSICIAN:  Matthew Lehtinen L. Juanetta [name], Matthew [name].DATE OF BIRTH:  29-Jan-1976  DATE OF CONSULTATION: DATE OF DISCHARGE:                                CONSULTATION   Patient of the [facility] hospitalist.  REASON FOR CONSULTATION:  Respiratory failure on a ventilator.  HISTORY:  Matthew [name] is a 36 year old, who has a long known history of multiple medical problems including diabetes, psychiatric illness, multiple episodes of suicidal ideations, and he was found at home on the day of admission, unresponsive.  EMS was called, and at that time multiple tablets of Tegretol were found around him, but it is not clear what he took.  He has been intubated for airway protection.  PAST MEDICAL HISTORY:  Positive for anxiety and depression, multiple suicide attempts.  He has hypertension, diabetes, degenerative disk disease, and bipolar depression.  PAST SURGERY HISTORY:  Surgically, he has had knee surgery.  He has had a pelvic fracture and femur fracture.  SOCIAL HISTORY:  He has about a 25 pack-year smoking history, and he has been using a number of drugs.  FAMILY HISTORY:  His mother passed away this year.  REVIEW OF SYSTEMS:  Except as mentioned is negative.  It is unobtainable at this point.  PHYSICAL EXAMINATION:  GENERAL:  He is intubated on the ventilator. HEENT:  Pupils are reactive.  Nose and throat are clear.  Mucous membranes are moist. NECK:  Supple. CHEST:  Rhonchi bilaterally. HEART:  Regular. ABDOMEN:  Soft. EXTREMITIES:  No edema.  He is intubated and sedated.  His urine drug screen is positive for several substances.  Chest x-ray shows poor inspiration and it looks like there maybe some bilateral infiltrate.  ASSESSMENT:  Then is that, he has respiratory failure on the basis of a drug overdose.  I do not think we know exactly what  he took.  He has a history of hepatitis C, probably bipolar, diabetes mellitus, and dehydration.  PLAN:  I am going to see if we can taper his oxygen down.  I am going to go ahead and add an antibiotic because I am concerned that he has aspirin.  I would continue with all of his other treatments.     Matthew [name], Matthew [name].     ELH/MEDQ  D:  06/04/2012  T:  06/04/2012  Job:  [id number]

## 2012-06-04 NOTE — ED Notes (Signed)
Moved patient's bed in the room where I can visualize from nurses station.

## 2012-06-04 NOTE — Progress Notes (Signed)
   Called by RN for worsening Respiratory status with hypoxia. Patient also noted to become tachycardic and hypertensive.  Patient continues to be unresponsive.  Stat ABG was ordered which showed Hypercapneic acidosis with Ph of 7.24.   Patient with acute respiratory failure from drug overdose. Patient likely aspirating as well.  Will proceed with intubation and mechanical ventilation. Discussed with Dr. Deretha Emory who agrees and will attempt intubation. Will initiate Adult Mech Vent orders and Continuous sedation with Propofol. Get Stat CXR and ABG post intubation.  Will change to ICU status. Change to inpatient status.  Matthew [name] 6:49 AM

## 2012-06-04 NOTE — ED Notes (Signed)
Patient still remains unresponsive at this time.

## 2012-06-04 NOTE — Progress Notes (Signed)
Patient condition worsened; made MD aware; patient intubated at [phone].

## 2012-06-04 NOTE — Progress Notes (Signed)
UR Chart Review Completed  
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
## 2012-06-04 NOTE — ED Notes (Signed)
No Change in patient status after 2 mg Narcan IV
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
## 2012-06-04 NOTE — ED Notes (Signed)
No change in patient status after the Narcan

## 2012-06-04 NOTE — ED Notes (Signed)
Positive gag reflex. Patient's mouth cleaned out and oral gel placed in patient's mouth. Patient bit down on swab, moved his left arm and moved both legs when doing oral care. Patient has not further response at this time.

## 2012-06-04 NOTE — Clinical Social Work Note (Signed)
CSW unable to assess as patient is currently intubated.  CSW reviewed chart and spoke w RN, will monitor and assess when appropriate.  Santa Genera, LCSW Clinical Social Worker [phone])

## 2012-06-04 NOTE — Plan of Care (Signed)
Problem: Phase I Progression Outcomes Goal: Voiding-avoid urinary catheter unless indicated Outcome: Not Progressing Foley catheter placed at this time
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
## 2012-06-04 NOTE — Progress Notes (Signed)
Several family members have called over the phone inquiring about patients condition.  Only information given is patient is unable to talk and is stable.  Review of confidentiality given to each family member calling.  Referred family members to call and talk to patients girlfriend until patient able to talk to them.  The supervisory [place] Probation officer Baldo Daub came by this afternoon and removed the ankle bracelet from patients left ankle.  Mr. Sheria Lang can be reached at [phone] for any questions if needed.  Patient has had uneventful day today. No acute distress noted.

## 2012-06-05 ENCOUNTER — Ambulatory Visit (HOSPITAL_COMMUNITY): Payer: Self-pay | Admitting: Psychiatry

## 2012-06-05 ENCOUNTER — Inpatient Hospital Stay (HOSPITAL_COMMUNITY): Payer: Medicaid Other

## 2012-06-05 DIAGNOSIS — Z720 Tobacco use: Secondary | ICD-10-CM | POA: Diagnosis present

## 2012-06-05 DIAGNOSIS — D696 Thrombocytopenia, unspecified: Secondary | ICD-10-CM | POA: Diagnosis present

## 2012-06-05 DIAGNOSIS — J690 Pneumonitis due to inhalation of food and vomit: Secondary | ICD-10-CM

## 2012-06-05 DIAGNOSIS — J9600 Acute respiratory failure, unspecified whether with hypoxia or hypercapnia: Secondary | ICD-10-CM

## 2012-06-05 DIAGNOSIS — E876 Hypokalemia: Secondary | ICD-10-CM | POA: Diagnosis not present

## 2012-06-05 LAB — GLUCOSE, CAPILLARY
Glucose-Capillary: 103 mg/dL — ABNORMAL HIGH (ref 70–99)
Glucose-Capillary: 114 mg/dL — ABNORMAL HIGH (ref 70–99)
Glucose-Capillary: 120 mg/dL — ABNORMAL HIGH (ref 70–99)
Glucose-Capillary: 127 mg/dL — ABNORMAL HIGH (ref 70–99)
Glucose-Capillary: 130 mg/dL — ABNORMAL HIGH (ref 70–99)
Glucose-Capillary: 135 mg/dL — ABNORMAL HIGH (ref 70–99)
Glucose-Capillary: 164 mg/dL — ABNORMAL HIGH (ref 70–99)

## 2012-06-05 LAB — BLOOD GAS, ARTERIAL
Drawn by: 21310
MECHVT: 500 mL
Mode: POSITIVE
PEEP: 5 cmH2O
Patient temperature: 37
RATE: 18 resp/min
TCO2: 23.1 mmol/L (ref 0–100)
pCO2 arterial: 41.3 mmHg (ref 35.0–45.0)
pCO2 arterial: 42.4 mmHg (ref 35.0–45.0)
pH, Arterial: 7.433 (ref 7.350–7.450)
pH, Arterial: 7.418 (ref 7.350–7.450)
pO2, Arterial: 132 mmHg — ABNORMAL HIGH (ref 80.0–100.0)

## 2012-06-05 LAB — COMPREHENSIVE METABOLIC PANEL
Albumin: 3.7 g/dL (ref 3.5–5.2)
Alkaline Phosphatase: 63 U/L (ref 39–117)
BUN: 13 mg/dL (ref 6–23)
CO2: 28 mEq/L (ref 19–32)
Chloride: 100 mEq/L (ref 96–112)
Glucose, Bld: 129 mg/dL — ABNORMAL HIGH (ref 70–99)
Potassium: 3.3 mEq/L — ABNORMAL LOW (ref 3.5–5.1)
Total Bilirubin: 0.4 mg/dL (ref 0.3–1.2)

## 2012-06-05 LAB — CBC
MCV: 86.7 fL (ref 78.0–100.0)
Platelets: 104 10*3/uL — ABNORMAL LOW (ref 150–400)
RDW: 12.9 % (ref 11.5–15.5)
WBC: 9.4 10*3/uL (ref 4.0–10.5)

## 2012-06-05 MED ORDER — SODIUM CHLORIDE 0.9 % IJ SOLN
INTRAMUSCULAR | Status: AC
  Filled 2012-06-05: qty 3

## 2012-06-05 MED ORDER — POTASSIUM CHLORIDE 10 MEQ/100ML IV SOLN
10.0000 meq | INTRAVENOUS | Status: AC
  Administered 2012-06-05 (×2): 10 meq via INTRAVENOUS
  Filled 2012-06-05 (×2): qty 100

## 2012-06-05 MED ORDER — FENTANYL 25 MCG/HR TD PT72
25.0000 ug | MEDICATED_PATCH | TRANSDERMAL | Status: DC
  Administered 2012-06-05: 25 ug via TRANSDERMAL
  Filled 2012-06-05: qty 1

## 2012-06-05 MED ORDER — NICOTINE 21 MG/24HR TD PT24
21.0000 mg | MEDICATED_PATCH | Freq: Every day | TRANSDERMAL | Status: DC
  Administered 2012-06-05 – 2012-06-06 (×2): 21 mg via TRANSDERMAL
  Filled 2012-06-05 (×3): qty 1

## 2012-06-05 MED ORDER — ALPRAZOLAM 0.5 MG PO TABS
0.5000 mg | ORAL_TABLET | Freq: Three times a day (TID) | ORAL | Status: DC | PRN
  Administered 2012-06-05 – 2012-06-07 (×4): 0.5 mg via ORAL
  Filled 2012-06-05 (×4): qty 1

## 2012-06-05 MED ORDER — LISINOPRIL 10 MG PO TABS
20.0000 mg | ORAL_TABLET | Freq: Every day | ORAL | Status: DC
  Administered 2012-06-05 – 2012-06-07 (×3): 20 mg via ORAL
  Filled 2012-06-05 (×3): qty 2

## 2012-06-05 NOTE — Progress Notes (Addendum)
Pt verbally stated to me, and Dewayne Hatch SW that if he is unable to speak for himself he wants his son to make decisions for him. He gave [place] verbal permission to release information to his son and girlfriend only. Pt states we may also release information to his parole officer, Ed.

## 2012-06-05 NOTE — Care Management Note (Unsigned)
    Page 1 of 1   06/05/2012     4:21:56 PM   CARE MANAGEMENT NOTE 06/05/2012  Patient:  Matthew [name], Matthew [name]   Account Number:  [id number]  Date Initiated:  06/05/2012  Documentation initiated by:  Anibal Henderson  Subjective/Objective Assessment:   patients admitted with an overdose and was on the ventilator. Patient was extubated today, and will be followed by the ACT team. He was under house arrest and this constitutes breaking parole. He will either be transferred to a psychiatric     Action/Plan:   correction facility, or  a  community psychiatric facility. Disposition will be determined by ACT team and  his parole officer-patient is not medically stable for transfer yet   Anticipated DC Date:  06/08/2012   Anticipated DC Plan:  [facility]      DC Planning Services  CM consult      Choice offered to / List presented to:             Status of service:  In process, will continue to follow Medicare Important Message given?   (If response is "NO", the following Medicare IM given date fields will be blank) Date Medicare IM given:   Date Additional Medicare IM given:    Discharge Disposition:    Per UR Regulation:  Reviewed for med. necessity/level of care/duration of stay  If discussed at Long Length of Stay Meetings, dates discussed:    Comments:  06/05/12 1500 Anibal Henderson RN

## 2012-06-05 NOTE — Plan of Care (Signed)
Problem: Phase I Progression Outcomes Goal: Initial discharge plan identified Outcome: Progressing Act team consult needed Goal: Voiding-avoid urinary catheter unless indicated Outcome: Progressing Foley removed today and pt voiding without difficultly Goal: Hemodynamically stable Outcome: Progressing extubated today and breathing on room air

## 2012-06-05 NOTE — Clinical Social Work Psychosocial (Signed)
Clinical Social Work Department BRIEF PSYCHOSOCIAL ASSESSMENT 06/05/2012  Patient:  Matthew [name], Matthew [name]     Account Number:  [id number]     Admit date:  06/03/2012  Clinical Social Worker:  Santa Genera, CLINICAL SOCIAL WORKER  Date/Time:  06/05/2012 10:30 AM  Referred by:  Physician  Date Referred:  06/04/2012 Referred for  Other - See comment   Other Referral:   Son wants information on who can consent for patient if patient is unable to consent for self.   Interview type:  Patient Other interview type:   Spoke w Matthew [name] for emergency contacts.    PSYCHOSOCIAL DATA Living Status:  FRIEND(S) Admitted from facility:   Level of care:   Primary support name:  Matthew [name] Primary support relationship to patient:  CHILD, ADULT Degree of support available:   Limited    CURRENT CONCERNS Current Concerns  Other - See comment   Other Concerns:   Consents, substance use, legal issues    SOCIAL WORK ASSESSMENT / PLAN CSW met w patient at bedside, patient oriented and recently extubated.  Patient agreeable and eager to talk.  Patient is currently on probation for federal drug charges.  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  Patient lives w girlfriend, Matthew [name].  Mother is deceased, father is not present in his life.  Has son, Matthew [name], who lives in [place] and is in contact w father.    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  Says that he has been seeking long term drug treatment for cocaine in order to avoid being sent to prison, and has worked w Matthew [name] for past month Teacher, adult education).  Says they have "been no help."  Patient says he had  over one month of clean time and needed to remain abstinent as a condition of probation. At July court date, udge diverted patient from prison by ordering that he enter a long term drug treatment  program.    Spoke w probation officer who says that patient has very limited social support, girlfriend is emergency contact. Patient has half brother in [place] as well as son.  PO did not know of any other relatives.  Says patient has not been in regular contact w PO, but PO did see patient yesterday in ICU.  PO says patient must wear ankle bracelet to monitor his whereabouts, and will have another court date to determine sentencing if he does not enter SA treatment.    Patient says he was hospitalized at [name] in September for mental health treatment, but is not currently under care of anyone for SA issues other than trying to get help and referrals through [name].  Working w Matthew [name] has been unsuccessful for patient to this point.    Per patient, it is OK to talk w girlfriend (Matthew [name]), son Matthew [name]) and probation office Matthew [name]).  If he is unable to consent for himself, he says that son can make decisions for him if needed.   Assessment/plan status:  Psychosocial Support/Ongoing Assessment of Needs Other assessment/ plan:   Matthew [name] to be consulted per MD  Patient will need long term SA placement   Information/referral to community resources:   None at this time, will provide information on SA treatment resources in area.    PATIENT'S/FAMILY'S RESPONSE TO PLAN OF CARE: Patient appreciative    Santa Genera,  LCSW Clinical Social Worker [phone])

## 2012-06-05 NOTE — Progress Notes (Addendum)
Chart reviewed.  D.W. Dr. Juanetta Gosling and RN  Subjective: Unable. No problems reported by RN. No further history obtained.  Objective: Vital signs in last 24 hours: Filed Vitals:   06/05/12 0715 06/05/12 0730 06/05/12 0745 06/05/12 0800  BP: 134/93 139/97 140/88 135/90  Pulse: 95 98 96 97  Temp:      TempSrc:      Resp: 18 18 18 18   Height:      Weight:      SpO2: 100% 100% 100% 100%   Weight change: 1.9 kg (4 lb 3 oz)  Intake/Output Summary (Last 24 hours) at 06/05/12 0817 Last data filed at 06/05/12 0803  Gross per 24 hour  Intake 1887.9 ml  Output    900 ml  Net  987.9 ml   General: Intubated, sedated Lungs clear to auscultation bilaterally without wheeze rhonchi or rales Cardiovascular regular rate rhythm without murmurs gallops rubs Abdomen soft nontender nondistended Extremities no clubbing cyanosis or edema  Lab Results: Basic Metabolic Panel:  Lab 06/05/12 4540 06/03/12 2353  NA 137 134*  K 3.3* 4.1  CL 100 98  CO2 28 28  GLUCOSE 129* 226*  BUN 13 13  CREATININE 0.83 0.74  CALCIUM 8.8 9.1  MG -- --  PHOS -- --   Liver Function Tests:  Lab 06/05/12 0526 06/03/12 2353  AST 45* 33  ALT 67* 87*  ALKPHOS 63 67  BILITOT 0.4 0.3  PROT 7.0 7.8  ALBUMIN 3.7 4.2    Lab 06/03/12 2353  LIPASE 20  AMYLASE --   No results found for this basename: AMMONIA:2 in the last 168 hours CBC:  Lab 06/05/12 0526 06/03/12 2353  WBC 9.4 12.5*  NEUTROABS -- 10.8*  HGB 15.4 16.4  HCT 44.4 46.3  MCV 86.7 85.4  PLT 104* 120*   Cardiac Enzymes: No results found for this basename: CKTOTAL:3,CKMB:3,CKMBINDEX:3,TROPONINI:3 in the last 168 hours BNP: No results found for this basename: PROBNP:3 in the last 168 hours D-Dimer: No results found for this basename: DDIMER:2 in the last 168 hours CBG:  Lab 06/05/12 0725 06/05/12 0342 06/05/12 0018 06/04/12 1952 06/04/12 1615 06/04/12 1130  GLUCAP 114* 120* 127* 135* 126* 132*   Hemoglobin A1C:  Lab 06/04/12 0447    HGBA1C 7.9*   Fasting Lipid Panel: No results found for this basename: CHOL,HDL,LDLCALC,TRIG,CHOLHDL,LDLDIRECT in the last 981 hours Thyroid Function Tests: No results found for this basename: TSH,T4TOTAL,FREET4,T3FREE,THYROIDAB in the last 168 hours Coagulation: No results found for this basename: LABPROT:4,INR:4 in the last 168 hours Anemia Panel: No results found for this basename: VITAMINB12,FOLATE,FERRITIN,TIBC,IRON,RETICCTPCT in the last 168 hours Urine Drug Screen: Drugs of Abuse     Component Value Date/Time   LABOPIA NONE DETECTED 06/04/2012 0007   LABOPIA NEGATIVE 04/22/2012 1305   COCAINSCRNUR POSITIVE* 06/04/2012 0007   COCAINSCRNUR NEGATIVE 04/22/2012 1305   LABBENZ POSITIVE* 06/04/2012 0007   LABBENZ NEGATIVE 04/22/2012 1305   AMPHETMU NONE DETECTED 06/04/2012 0007   AMPHETMU NEGATIVE 04/22/2012 1305   THCU POSITIVE* 06/04/2012 0007   LABBARB POSITIVE* 06/04/2012 0007    Alcohol Level:  Lab 06/04/12 0030  ETH <11   Urinalysis:  Lab 06/04/12 0007  COLORURINE YELLOW  LABSPEC >1.030*  PHURINE 5.5  GLUCOSEU 500*  HGBUR NEGATIVE  BILIRUBINUR NEGATIVE  KETONESUR 40*  PROTEINUR NEGATIVE  UROBILINOGEN 0.2  NITRITE NEGATIVE  LEUKOCYTESUR NEGATIVE   Micro Results: Recent Results (from the past 240 hour(s))  MRSA PCR SCREENING     Status: Normal   Collection Time  06/04/12  5:05 AM      Component Value Range Status Comment   MRSA by PCR NEGATIVE  NEGATIVE Final    Studies/Results: Ct Head Wo Contrast  06/04/2012  *RADIOLOGY REPORT*  Clinical Data: Altered mental status.  CT HEAD WITHOUT CONTRAST  Technique:  Contiguous axial images were obtained from the base of the skull through the vertex without contrast.  Comparison: CT of the head performed 12/20 30,011  Findings: There is no evidence of acute infarction, mass lesion, or intra- or extra-axial hemorrhage on CT.  Evaluation is mildly suboptimal due to motion artifact.  The posterior fossa, including the  cerebellum, brainstem and fourth ventricle, is within normal limits.  The third and lateral ventricles, and basal ganglia are unremarkable in appearance.  The cerebral hemispheres are symmetric in appearance, with normal gray- white differentiation.  No mass effect or midline shift is seen.  There is no evidence of fracture; visualized osseous structures are unremarkable in appearance.  The orbits are within normal limits. The paranasal sinuses and mastoid air cells are well-aerated.  No significant soft tissue abnormalities are seen.  IMPRESSION: Unremarkable noncontrast CT of the head.   Original Report Authenticated By: Tonia Ghent, M.D.    Dg Chest Port 1 View  06/05/2012  *RADIOLOGY REPORT*  Clinical Data: Respiratory failure  PORTABLE CHEST - 1 VIEW  Comparison: Yesterday  Findings: Stable endotracheal tube.  NG tube placed with its tip in the fundus of the stomach.  Mild left lung base atelectasis is stable.  Dense consolidation and/or collapse has occurred at the right base involving right lower and middle lobes.  Right upper lobe remains aerated and clear.  Lung apices not included limiting the study.  IMPRESSION: Stable left base atelectasis  New right middle and lower lobe consolidation verse collapse.  NG tube placed into the fundus of the stomach.   Original Report Authenticated By: Jolaine Click, M.D.    Dg Chest Portable 1 View  06/04/2012  *RADIOLOGY REPORT*  Clinical Data: Overdose.  PORTABLE CHEST - 1 VIEW 06/04/2012 0008 hours:  Comparison: Two-view chest x-ray 10/19/2011, 10/17/2010.  Findings: Suboptimal inspiration accounts for crowded bronchovascular markings, especially in the lung bases, and accentuates the cardiac silhouette.  Taking this into account, cardiomediastinal silhouette unremarkable and unchanged.  Lungs clear.  IMPRESSION: Suboptimal inspiration.  No acute cardiopulmonary disease.   Original Report Authenticated By: Hulan Saas, M.D.    Dg Chest Port 1v Same  Day  06/04/2012  *RADIOLOGY REPORT*  Clinical Data: Hypoxia, overdose, intubation  PORTABLE CHEST - 1 VIEW SAME DAY  Comparison: 06/03/2012  Findings: Endotracheal tube tip at the level of the clavicular heads, 6.5 cm above the level of the carina.  Mild left lung base opacity is likely atelectasis.  Mild associated left hemidiaphragm elevation.  Otherwise, no consolidation, pleural effusion, or pneumothorax.  Cardiomediastinal contours within normal range.  No acute osseous finding.  IMPRESSION: Endotracheal tube tip 6.5 cm proximal to the carina.  Mild left lung base atelectasis.   Original Report Authenticated By: Jearld Lesch, M.D.    Scheduled Meds:   . ampicillin-sulbactam (UNASYN) IV  1.5 g Intravenous Q6H  . antiseptic oral rinse  15 mL Mouth Rinse QID  . chlorhexidine  15 mL Mouth Rinse BID  . enoxaparin (LOVENOX) injection  40 mg Subcutaneous Q24H  . insulin aspart  0-9 Units Subcutaneous Q4H  . [EXPIRED] lidocaine (cardiac) 100 mg/9ml      . pantoprazole (PROTONIX) IV  40 mg Intravenous  Daily  . [EXPIRED] rocuronium      . [COMPLETED] sodium chloride      . thiamine  100 mg Intravenous Daily   Continuous Infusions:   . [EXPIRED] sodium chloride 125 mL/hr at 06/04/12 1642  . propofol 25 mcg/kg/min (06/05/12 0803)   PRN Meds:.acetaminophen, acetaminophen, albuterol, fentaNYL, metoprolol, ondansetron (ZOFRAN) IV, ondansetron Assessment/Plan: Principal Problem:  *Altered mental status Active Problems:  Drug overdose  Acute respiratory failure  Aspiration pneumonia  Mood disorder  DM type 2 (diabetes mellitus, type 2)  HEPATITIS C  Dehydration  Hypokalemia  Tobacco use  thrombocytopenia  Ventilator management per Dr. Juanetta Gosling. Continue antibiotics. If patient does not come off the ventilator today, will start tube feeds. Continue DVT and ulcer prophylaxis. Currently on propofol for sedation. Replete potassium and monitor platelets.   LOS: 2 days   Jola Critzer  L 06/05/2012, 8:17 AM

## 2012-06-05 NOTE — Procedures (Signed)
Extubation Procedure Note  At 0925 RT performed weaning parameters on patient. Patient performed a -60 on NIF and 2.2L on FVC, audible leak around ET tube and ABG performed with normal parameters. RT notified physician and received orders to extubate. Pt remained stable throughout and SATs 99% on 4L nasal cannula. Patient performed on IS, no complications noted and RN and family at bedside  Patient Details:   Name: Matthew [name] DOB: 1975/09/04 MRN: [id number]   Airway Documentation:  Airway 8 mm (Active)  Secured at (cm) 24 cm 06/05/2012  8:28 AM  Measured From Lips 06/05/2012  8:28 AM  Secured Location Center 06/05/2012  8:28 AM  Secured By [employer] 06/05/2012  8:28 AM  Tube Holder Repositioned Yes 06/05/2012  5:24 AM  Cuff Pressure (cm H2O) 24 cm H2O 06/05/2012  8:28 AM  Site Condition Dry 06/05/2012  8:28 AM    Evaluation  O2 sats: stable throughout Complications: No apparent complications Patient did tolerate procedure well. Bilateral Breath Sounds: Diminished Suctioning: Oral;Airway Yes  [name] 06/05/2012, 10:03 AM

## 2012-06-05 NOTE — Progress Notes (Signed)
Subjective: He remains intubated sedated and on the ventilator.  Objective: Vital signs in last 24 hours: Temp:  [98.3 F (36.8 C)-98.9 F (37.2 C)] 98.3 F (36.8 C) (11/08 0400) Pulse Rate:  [78-116] 97  (11/08 0800) Resp:  [2-25] 18  (11/08 0800) BP: (109-160)/(73-102) 135/90 mmHg (11/08 0800) SpO2:  [97 %-100 %] 100 % (11/08 0800) FiO2 (%):  [39.8 %-65.1 %] 40.1 % (11/08 0800) Weight:  [89.2 kg (196 lb 10.4 oz)] 89.2 kg (196 lb 10.4 oz) (11/08 0536) Weight change: 1.9 kg (4 lb 3 oz) Last BM Date:  (unknown)  Intake/Output from previous day: 11/07 0701 - 11/08 0700 In: 1977.1 [I.V.:1827.1; IV Piggyback:150] Out: 900 [Urine:900]  PHYSICAL EXAM General appearance: no distress and Intubated and sedated Resp: clear to auscultation bilaterally Cardio: regular rate and rhythm, S1, S2 normal, no murmur, click, rub or gallop GI: soft, non-tender; bowel sounds normal; no masses,  no organomegaly Extremities: extremities normal, atraumatic, no cyanosis or edema  Lab Results:    Basic Metabolic Panel:  Basename 06/05/12 0526 06/03/12 2353  NA 137 134*  K 3.3* 4.1  CL 100 98  CO2 28 28  GLUCOSE 129* 226*  BUN 13 13  CREATININE 0.83 0.74  CALCIUM 8.8 9.1  MG -- --  PHOS -- --   Liver Function Tests:  Basename 06/05/12 0526 06/03/12 2353  AST 45* 33  ALT 67* 87*  ALKPHOS 63 67  BILITOT 0.4 0.3  PROT 7.0 7.8  ALBUMIN 3.7 4.2    Basename 06/03/12 2353  LIPASE 20  AMYLASE --   No results found for this basename: AMMONIA:2 in the last 72 hours CBC:  Basename 06/05/12 0526 06/03/12 2353  WBC 9.4 12.5*  NEUTROABS -- 10.8*  HGB 15.4 16.4  HCT 44.4 46.3  MCV 86.7 85.4  PLT 104* 120*   Cardiac Enzymes: No results found for this basename: CKTOTAL:3,CKMB:3,CKMBINDEX:3,TROPONINI:3 in the last 72 hours BNP: No results found for this basename: PROBNP:3 in the last 72 hours D-Dimer: No results found for this basename: DDIMER:2 in the last 72 hours CBG:  Basename  06/05/12 0725 06/05/12 0342 06/05/12 0018 06/04/12 1952 06/04/12 1615 06/04/12 1130  GLUCAP 114* 120* 127* 135* 126* 132*   Hemoglobin A1C:  Basename 06/04/12 0447  HGBA1C 7.9*   Fasting Lipid Panel: No results found for this basename: CHOL,HDL,LDLCALC,TRIG,CHOLHDL,LDLDIRECT in the last 72 hours Thyroid Function Tests: No results found for this basename: TSH,T4TOTAL,FREET4,T3FREE,THYROIDAB in the last 72 hours Anemia Panel: No results found for this basename: VITAMINB12,FOLATE,FERRITIN,TIBC,IRON,RETICCTPCT in the last 72 hours Coagulation: No results found for this basename: LABPROT:2,INR:2 in the last 72 hours Urine Drug Screen: Drugs of Abuse     Component Value Date/Time   LABOPIA NONE DETECTED 06/04/2012 0007   LABOPIA NEGATIVE 04/22/2012 1305   COCAINSCRNUR POSITIVE* 06/04/2012 0007   COCAINSCRNUR NEGATIVE 04/22/2012 1305   LABBENZ POSITIVE* 06/04/2012 0007   LABBENZ NEGATIVE 04/22/2012 1305   AMPHETMU NONE DETECTED 06/04/2012 0007   AMPHETMU NEGATIVE 04/22/2012 1305   THCU POSITIVE* 06/04/2012 0007   LABBARB POSITIVE* 06/04/2012 0007    Alcohol Level:  Basename 06/04/12 0030  ETH <11   Urinalysis:  Basename 06/04/12 0007  COLORURINE YELLOW  LABSPEC >1.030*  PHURINE 5.5  GLUCOSEU 500*  HGBUR NEGATIVE  BILIRUBINUR NEGATIVE  KETONESUR 40*  PROTEINUR NEGATIVE  UROBILINOGEN 0.2  NITRITE NEGATIVE  LEUKOCYTESUR NEGATIVE   Misc. Labs:  ABGS  Basename 06/05/12 0545  PHART 7.433  PO2ART 73.8*  TCO2 23.2  HCO3 27.1*  CULTURES Recent Results (from the past 240 hour(s))  MRSA PCR SCREENING     Status: Normal   Collection Time   06/04/12  5:05 AM      Component Value Range Status Comment   MRSA by PCR NEGATIVE  NEGATIVE Final    Studies/Results: Ct Head Wo Contrast  06/04/2012  *RADIOLOGY REPORT*  Clinical Data: Altered mental status.  CT HEAD WITHOUT CONTRAST  Technique:  Contiguous axial images were obtained from the base of the skull through the vertex without  contrast.  Comparison: CT of the head performed 12/20 30,011  Findings: There is no evidence of acute infarction, mass lesion, or intra- or extra-axial hemorrhage on CT.  Evaluation is mildly suboptimal due to motion artifact.  The posterior fossa, including the cerebellum, brainstem and fourth ventricle, is within normal limits.  The third and lateral ventricles, and basal ganglia are unremarkable in appearance.  The cerebral hemispheres are symmetric in appearance, with normal gray- white differentiation.  No mass effect or midline shift is seen.  There is no evidence of fracture; visualized osseous structures are unremarkable in appearance.  The orbits are within normal limits. The paranasal sinuses and mastoid air cells are well-aerated.  No significant soft tissue abnormalities are seen.  IMPRESSION: Unremarkable noncontrast CT of the head.   Original Report Authenticated By: Tonia Ghent, M.D.    Dg Chest Port 1 View  06/05/2012  *RADIOLOGY REPORT*  Clinical Data: Respiratory failure  PORTABLE CHEST - 1 VIEW  Comparison: Yesterday  Findings: Stable endotracheal tube.  NG tube placed with its tip in the fundus of the stomach.  Mild left lung base atelectasis is stable.  Dense consolidation and/or collapse has occurred at the right base involving right lower and middle lobes.  Right upper lobe remains aerated and clear.  Lung apices not included limiting the study.  IMPRESSION: Stable left base atelectasis  New right middle and lower lobe consolidation verse collapse.  NG tube placed into the fundus of the stomach.   Original Report Authenticated By: Jolaine Click, M.D.    Dg Chest Portable 1 View  06/04/2012  *RADIOLOGY REPORT*  Clinical Data: Overdose.  PORTABLE CHEST - 1 VIEW 06/04/2012 0008 hours:  Comparison: Two-view chest x-ray 10/19/2011, 10/17/2010.  Findings: Suboptimal inspiration accounts for crowded bronchovascular markings, especially in the lung bases, and accentuates the cardiac silhouette.   Taking this into account, cardiomediastinal silhouette unremarkable and unchanged.  Lungs clear.  IMPRESSION: Suboptimal inspiration.  No acute cardiopulmonary disease.   Original Report Authenticated By: Hulan Saas, M.D.    Dg Chest Port 1v Same Day  06/04/2012  *RADIOLOGY REPORT*  Clinical Data: Hypoxia, overdose, intubation  PORTABLE CHEST - 1 VIEW SAME DAY  Comparison: 06/03/2012  Findings: Endotracheal tube tip at the level of the clavicular heads, 6.5 cm above the level of the carina.  Mild left lung base opacity is likely atelectasis.  Mild associated left hemidiaphragm elevation.  Otherwise, no consolidation, pleural effusion, or pneumothorax.  Cardiomediastinal contours within normal range.  No acute osseous finding.  IMPRESSION: Endotracheal tube tip 6.5 cm proximal to the carina.  Mild left lung base atelectasis.   Original Report Authenticated By: Jearld Lesch, M.D.     Medications:  Scheduled:   . ampicillin-sulbactam (UNASYN) IV  1.5 g Intravenous Q6H  . antiseptic oral rinse  15 mL Mouth Rinse QID  . chlorhexidine  15 mL Mouth Rinse BID  . enoxaparin (LOVENOX) injection  40 mg Subcutaneous Q24H  . insulin  aspart  0-9 Units Subcutaneous Q4H  . [EXPIRED] lidocaine (cardiac) 100 mg/91ml      . pantoprazole (PROTONIX) IV  40 mg Intravenous Daily  . [EXPIRED] rocuronium      . [COMPLETED] sodium chloride      . [COMPLETED] sodium chloride      . thiamine  100 mg Intravenous Daily   Continuous:   . [EXPIRED] sodium chloride 125 mL/hr at 06/04/12 1642  . propofol 25 mcg/kg/min (06/05/12 0803)   WUJ:[id number], acetaminophen, albuterol, fentaNYL, metoprolol, ondansetron (ZOFRAN) IV, ondansetron  Assesment: He has respiratory failure associated with a drug overdose. He is on a ventilator. He probably has some element of COPD as well. He is still sedated so at this point I'm not sure if he's going to be able to come off the ventilator or not. I started him on antibiotics  yesterday evening because of my concern that he may have aspirated Principal Problem:  *Altered mental status Active Problems:  HEPATITIS C  Mood disorder  Drug overdose  DM type 2 (diabetes mellitus, type 2)  Dehydration  Acute respiratory failure    Plan: I think it's okay to see if he can come off the ventilator today. I will be out of town after this morning until the 11th    LOS: 2 days   Medha Pippen L 06/05/2012, 8:12 AM

## 2012-06-05 NOTE — Progress Notes (Signed)
Nutrition Follow-up  Intervention:   Obtain food preferences each meal to maximize oral intake  Assessment:   Pt successfully extubated this morning and diet advanced.  Diet Order: CHO Modified  Meds: Scheduled Meds:   . ampicillin-sulbactam (UNASYN) IV  1.5 g Intravenous Q6H  . antiseptic oral rinse  15 mL Mouth Rinse QID  . chlorhexidine  15 mL Mouth Rinse BID  . enoxaparin (LOVENOX) injection  40 mg Subcutaneous Q24H  . insulin aspart  0-9 Units Subcutaneous Q4H  . [EXPIRED] lidocaine (cardiac) 100 mg/80ml      . lisinopril  20 mg Oral Daily  . nicotine  21 mg Transdermal Daily  . [COMPLETED] potassium chloride  10 mEq Intravenous Q1 Hr x 2  . [EXPIRED] rocuronium      . sodium chloride      . sodium chloride      . [DISCONTINUED] pantoprazole (PROTONIX) IV  40 mg Intravenous Daily  . [DISCONTINUED] thiamine  100 mg Intravenous Daily   Continuous Infusions:   . [EXPIRED] sodium chloride 125 mL/hr at 06/04/12 1642  . [DISCONTINUED] propofol Stopped (06/05/12 0832)   PRN Meds:.acetaminophen, albuterol, ALPRAZolam, metoprolol, ondansetron (ZOFRAN) IV, ondansetron, [DISCONTINUED] acetaminophen, [DISCONTINUED] fentaNYL   CMP     Component Value Date/Time   NA 137 06/05/2012 0526   K 3.3* 06/05/2012 0526   CL 100 06/05/2012 0526   CO2 28 06/05/2012 0526   GLUCOSE 129* 06/05/2012 0526   BUN 13 06/05/2012 0526   CREATININE 0.83 06/05/2012 0526   CALCIUM 8.8 06/05/2012 0526   PROT 7.0 06/05/2012 0526   ALBUMIN 3.7 06/05/2012 0526   AST 45* 06/05/2012 0526   ALT 67* 06/05/2012 0526   ALKPHOS 63 06/05/2012 0526   BILITOT 0.4 06/05/2012 0526   GFRNONAA >90 06/05/2012 0526   GFRAA >90 06/05/2012 0526    CBG (last 3)   Basename 06/05/12 1124 06/05/12 0725 06/05/12 0342  GLUCAP 130* 114* 120*     Intake/Output Summary (Last 24 hours) at 06/05/12 1405 Last data filed at 06/05/12 1306  Gross per 24 hour  Intake 1633.8 ml  Output    900 ml  Net  733.8 ml    Weight Status:  196.10# (89kg)  Estimated needs:   Kcal:1838 kcals daily  Protein:87-109 grams protein daily  Fluid:1.8-2.0 L fluid daily  Inadequate oral intake imroving r/t pt now extubated and diet advanced  Goal: Pt to meet >/= 90% of their estimated nutrition needs; not met  Monitor: meal intake, labs and wt changes   [phone]

## 2012-06-05 NOTE — Progress Notes (Signed)
ACT team consult ordered. Diane, from ACT team, notified by phone at 1025. Since pt was just extubated, assessment will probably be done tomorrow when pt will be move alert and able to talk. Patient has a Comptroller at bedside at all times, and suicide precautions continue.

## 2012-06-05 NOTE — Progress Notes (Signed)
Weaning initiated this am. Pt became anxious and agitated at 0850. Verbal attempts to calm patient unsuccessful, and family members became panicked at that time. RT called, fentanyl 25 mcg given for pain. O2 sat remained in high 90s to 100%. BP 179/105 HR 114 at 0900. Metoprolol 5mg  given at 0904. Pt resting calmly at this time, with family by side.

## 2012-06-06 DIAGNOSIS — E876 Hypokalemia: Secondary | ICD-10-CM

## 2012-06-06 LAB — GLUCOSE, CAPILLARY: Glucose-Capillary: 187 mg/dL — ABNORMAL HIGH (ref 70–99)

## 2012-06-06 LAB — CBC
HCT: 41.1 % (ref 39.0–52.0)
MCH: 29.4 pg (ref 26.0–34.0)
MCV: 85.8 fL (ref 78.0–100.0)
Platelets: 108 10*3/uL — ABNORMAL LOW (ref 150–400)
RBC: 4.79 MIL/uL (ref 4.22–5.81)

## 2012-06-06 LAB — BASIC METABOLIC PANEL
BUN: 9 mg/dL (ref 6–23)
CO2: 29 mEq/L (ref 19–32)
Calcium: 8.9 mg/dL (ref 8.4–10.5)
Creatinine, Ser: 0.72 mg/dL (ref 0.50–1.35)
Glucose, Bld: 190 mg/dL — ABNORMAL HIGH (ref 70–99)

## 2012-06-06 MED ORDER — INSULIN ASPART 100 UNIT/ML ~~LOC~~ SOLN
0.0000 [IU] | Freq: Three times a day (TID) | SUBCUTANEOUS | Status: DC
  Administered 2012-06-06: 2 [IU] via SUBCUTANEOUS
  Administered 2012-06-06: 1 [IU] via SUBCUTANEOUS
  Administered 2012-06-07: 2 [IU] via SUBCUTANEOUS

## 2012-06-06 MED ORDER — AMOXICILLIN-POT CLAVULANATE 875-125 MG PO TABS
1.0000 | ORAL_TABLET | Freq: Two times a day (BID) | ORAL | Status: DC
  Administered 2012-06-06 – 2012-06-07 (×3): 1 via ORAL
  Filled 2012-06-06 (×6): qty 1

## 2012-06-06 MED ORDER — POTASSIUM CHLORIDE CRYS ER 20 MEQ PO TBCR
20.0000 meq | EXTENDED_RELEASE_TABLET | Freq: Two times a day (BID) | ORAL | Status: DC
  Administered 2012-06-06 – 2012-06-07 (×3): 20 meq via ORAL
  Filled 2012-06-06 (×3): qty 1

## 2012-06-06 MED ORDER — INSULIN ASPART 100 UNIT/ML ~~LOC~~ SOLN: 0.0000 [IU] | Freq: Every day | SUBCUTANEOUS | Status: DC

## 2012-06-06 MED ORDER — AMITRIPTYLINE HCL 25 MG PO TABS
50.0000 mg | ORAL_TABLET | Freq: Every evening | ORAL | Status: DC | PRN
  Administered 2012-06-06: 50 mg via ORAL
  Filled 2012-06-06: qty 2

## 2012-06-06 MED ORDER — METFORMIN HCL 500 MG PO TABS
500.0000 mg | ORAL_TABLET | Freq: Two times a day (BID) | ORAL | Status: DC
  Administered 2012-06-06 – 2012-06-07 (×3): 500 mg via ORAL
  Filled 2012-06-06 (×3): qty 1

## 2012-06-06 MED ORDER — VITAMIN B-1 100 MG PO TABS
100.0000 mg | ORAL_TABLET | Freq: Every day | ORAL | Status: DC
  Administered 2012-06-06 – 2012-06-07 (×2): 100 mg via ORAL
  Filled 2012-06-06 (×2): qty 1

## 2012-06-06 NOTE — Progress Notes (Signed)
[facility] SUCTION SET UP FOR PT,  FAMILY MEMBERS IN TO VISIT. PT REQUESTING TO GO DOWN STAIRS TO GET SNACK WHICH HE WAS TOLD HE SHOULD NOT GET. P T NOT ALLOWED TO GO DOWN STAIRS.

## 2012-06-06 NOTE — Progress Notes (Signed)
Patient was successfully extubated yesterday. No new issues reported.  Subjective: Patient denies suicide attempt. He denies suicidal ideation. He had been partying with some friends. He had drank about a fifth of liquor, and a couple of 40 ounce small liquor bottles. They were taking pills and using illicit drugs. He denies purposeful overdose on any of his prescribed medications. He is interested in inpatient drug treatment if he can find a program. He has a cough and some chest pain. Also some mouth pain.  Objective: Vital signs in last 24 hours: Filed Vitals:   06/06/12 0500 06/06/12 0600 06/06/12 0700 06/06/12 0800  BP: 120/67 121/72 120/76 123/72  Pulse: 72 68 75 74  Temp:      TempSrc:      Resp: 17 17 14 20   Height:      Weight: 90.4 kg (199 lb 4.7 oz)     SpO2: 94% 98% 96% 94%   Weight change: 1.2 kg (2 lb 10.3 oz)  Intake/Output Summary (Last 24 hours) at 06/06/12 9604 Last data filed at 06/06/12 5409  Gross per 24 hour  Intake   1160 ml  Output   1300 ml  Net   -140 ml   General: Alert. Watching TV. Cooperative. Appropriate and oriented. Lungs clear to auscultation bilaterally without wheeze rhonchi or rales, occasional cough Cardiovascular regular rate rhythm without murmurs gallops rubs Abdomen soft nontender nondistended Extremities no clubbing cyanosis or edema Psychiatric: Normal affect. Pleasant and cooperative. Good eye contact.  Lab Results: Basic Metabolic Panel:  Lab 06/06/12 8119 06/05/12 0526  NA 141 137  K 3.3* 3.3*  CL 104 100  CO2 29 28  GLUCOSE 190* 129*  BUN 9 13  CREATININE 0.72 0.83  CALCIUM 8.9 8.8  MG -- --  PHOS -- --   Liver Function Tests:  Lab 06/05/12 0526 06/03/12 2353  AST 45* 33  ALT 67* 87*  ALKPHOS 63 67  BILITOT 0.4 0.3  PROT 7.0 7.8  ALBUMIN 3.7 4.2    Lab 06/03/12 2353  LIPASE 20  AMYLASE --   No results found for this basename: AMMONIA:2 in the last 168 hours CBC:  Lab 06/06/12 0447 06/05/12 0526 06/03/12  2353  WBC 8.4 9.4 --  NEUTROABS -- -- 10.8*  HGB 14.1 15.4 --  HCT 41.1 44.4 --  MCV 85.8 86.7 --  PLT 108* 104* --   Cardiac Enzymes: No results found for this basename: CKTOTAL:3,CKMB:3,CKMBINDEX:3,TROPONINI:3 in the last 168 hours BNP: No results found for this basename: PROBNP:3 in the last 168 hours D-Dimer: No results found for this basename: DDIMER:2 in the last 168 hours CBG:  Lab 06/06/12 0733 06/06/12 0357 06/05/12 2357 06/05/12 2046 06/05/12 1651 06/05/12 1124  GLUCAP 133* 187* 164* 124* 103* 130*   Hemoglobin A1C:  Lab 06/04/12 0447  HGBA1C 7.9*   Fasting Lipid Panel: No results found for this basename: CHOL,HDL,LDLCALC,TRIG,CHOLHDL,LDLDIRECT in the last 147 hours Thyroid Function Tests: No results found for this basename: TSH,T4TOTAL,FREET4,T3FREE,THYROIDAB in the last 168 hours Coagulation: No results found for this basename: LABPROT:4,INR:4 in the last 168 hours Anemia Panel: No results found for this basename: VITAMINB12,FOLATE,FERRITIN,TIBC,IRON,RETICCTPCT in the last 168 hours Urine Drug Screen: Drugs of Abuse     Component Value Date/Time   LABOPIA NONE DETECTED 06/04/2012 0007   LABOPIA NEGATIVE 04/22/2012 1305   COCAINSCRNUR POSITIVE* 06/04/2012 0007   COCAINSCRNUR NEGATIVE 04/22/2012 1305   LABBENZ POSITIVE* 06/04/2012 0007   LABBENZ NEGATIVE 04/22/2012 1305   AMPHETMU NONE DETECTED 06/04/2012  0007   AMPHETMU NEGATIVE 04/22/2012 1305   THCU POSITIVE* 06/04/2012 0007   LABBARB POSITIVE* 06/04/2012 0007    Alcohol Level:  Lab 06/04/12 0030  ETH <11   Urinalysis:  Lab 06/04/12 0007  COLORURINE YELLOW  LABSPEC >1.030*  PHURINE 5.5  GLUCOSEU 500*  HGBUR NEGATIVE  BILIRUBINUR NEGATIVE  KETONESUR 40*  PROTEINUR NEGATIVE  UROBILINOGEN 0.2  NITRITE NEGATIVE  LEUKOCYTESUR NEGATIVE   Micro Results: Recent Results (from the past 240 hour(s))  MRSA PCR SCREENING     Status: Normal   Collection Time   06/04/12  5:05 AM      Component Value Range  Status Comment   MRSA by PCR NEGATIVE  NEGATIVE Final    Studies/Results: Dg Chest Port 1 View  06/05/2012  *RADIOLOGY REPORT*  Clinical Data: Respiratory failure  PORTABLE CHEST - 1 VIEW  Comparison: Yesterday  Findings: Stable endotracheal tube.  NG tube placed with its tip in the fundus of the stomach.  Mild left lung base atelectasis is stable.  Dense consolidation and/or collapse has occurred at the right base involving right lower and middle lobes.  Right upper lobe remains aerated and clear.  Lung apices not included limiting the study.  IMPRESSION: Stable left base atelectasis  New right middle and lower lobe consolidation verse collapse.  NG tube placed into the fundus of the stomach.   Original Report Authenticated By: Jolaine Click, M.D.    Dg Shoulder Left  06/05/2012  *RADIOLOGY REPORT*  Clinical Data: Pain post fall  LEFT SHOULDER - 2+ VIEW  Comparison: 02/14/2010  Findings: AC joint alignment normal. Osseous mineralization normal. No acute fracture, dislocation or bone destruction. Visualized left ribs appear intact.  IMPRESSION: No acute osseous abnormalities. No interval change.   Original Report Authenticated By: Ulyses Southward, M.D.    Scheduled Meds:    . ampicillin-sulbactam (UNASYN) IV  1.5 g Intravenous Q6H  . enoxaparin (LOVENOX) injection  40 mg Subcutaneous Q24H  . fentaNYL  25 mcg Transdermal Q72H  . insulin aspart  0-9 Units Subcutaneous Q4H  . lisinopril  20 mg Oral Daily  . nicotine  21 mg Transdermal Daily  . [COMPLETED] potassium chloride  10 mEq Intravenous Q1 Hr x 2  . [EXPIRED] sodium chloride      . [EXPIRED] sodium chloride      . [DISCONTINUED] antiseptic oral rinse  15 mL Mouth Rinse QID  . [DISCONTINUED] chlorhexidine  15 mL Mouth Rinse BID  . [DISCONTINUED] pantoprazole (PROTONIX) IV  40 mg Intravenous Daily  . [DISCONTINUED] thiamine  100 mg Intravenous Daily   Continuous Infusions:    . [DISCONTINUED] propofol Stopped (06/05/12 0832)   PRN  Meds:.acetaminophen, albuterol, ALPRAZolam, metoprolol, ondansetron (ZOFRAN) IV, ondansetron, [DISCONTINUED] acetaminophen, [DISCONTINUED] fentaNYL Assessment/Plan: Principal Problem:  *Altered mental status Active Problems:  Drug overdose, accidental  Acute respiratory failure  Aspiration pneumonia  Mood disorder  DM type 2 (diabetes mellitus, type 2)  HEPATITIS C  Dehydration  Hypokalemia  Tobacco use  Thrombocytopenia  thrombocytopenia  Patient's overdose was accidental. He was drinking and taking drugs. No evidence of suicidal ideation or attempt. Will discontinue sitter. At thiamine. Increase activity. Discontinue telemetry. Replete potassium. Xanax has been resumed on a when necessary basis at a lower dose. Will resume antipsychotics as needed. Increase activity. Saline lock IV. Change antibiotics to by mouth. Transferred to MedSurg. Await ACT team consult. Patient is medically stable.   LOS: 3 days   Janecia Palau L 06/06/2012, 8:22 AM

## 2012-06-06 NOTE — Progress Notes (Signed)
PT IS NOW MED/SURG PT. TRANSFERRED TO ROOM ICU-12 UN TIL WE ARE ABLE TO TRANSFER HIM TO A REGULAR FLOOR BED/ROOM.  ACT TEAM EVAL HAS BEEN PREFORMED. 1"1 SITTER HAS BEEN D/C'D.  VOICE MAIL MESSAGE LEFT FOR [facility] [place] [facility]LE OFFICE CONCERNING PT'S CURRENT STATUS AND TRANSFER TO REGULAR [facility]; ALSO POSSIBLE D/C HOME TOMORROW:-06-07-12
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
## 2012-06-07 DIAGNOSIS — F1910 Other psychoactive substance abuse, uncomplicated: Secondary | ICD-10-CM | POA: Diagnosis present

## 2012-06-07 LAB — GLUCOSE, CAPILLARY: Glucose-Capillary: 181 mg/dL — ABNORMAL HIGH (ref 70–99)

## 2012-06-07 MED ORDER — ACETAMINOPHEN 325 MG PO TABS: 650.0000 mg | ORAL_TABLET | Freq: Four times a day (QID) | ORAL | Status: DC | PRN

## 2012-06-07 MED ORDER — AMOXICILLIN-POT CLAVULANATE 875-125 MG PO TABS: 1.0000 | ORAL_TABLET | Freq: Two times a day (BID) | ORAL | Status: DC

## 2012-06-07 NOTE — Progress Notes (Signed)
Pt discharged with instructions.  Pt verbalized understanding.  Notified Dr. Lendell Caprice due to pt's request to have schedule;e Fentanyl patch for tomorrow placed today until he could see his Psychiatrist.  MD stated that he would have to follow up with his physician first of the week for new patch.  Voiced this to the patient and he verbalized understanding.  I also notified the patient that he may have medications locked up in the pharmacy, and asked him if he wanted me to go up to check.  He was very anxious and stated that he did not want to wait and was already walking towards the elevator.  Pt left the floor ambulating with staff and family in stable condition.

## 2012-06-07 NOTE — Discharge Summary (Signed)
Physician Discharge Summary  Patient ID: Matthew [name] MRN: [id number] DOB/AGE: [id number] 37 y.o.  Admit date: 06/03/2012 Discharge date: 06/07/2012  Discharge Diagnoses:  Active Problems:  Drug overdose  Acute respiratory failure, ventilator dependent  Aspiration pneumonia  Polysubstance abuse  Mood disorder  DM type 2 (diabetes mellitus, type 2)  HEPATITIS C  Dehydration  Hypokalemia  Tobacco use  Thrombocytopenia    Medication List     As of 06/07/2012  8:35 AM    TAKE these medications         acetaminophen 325 MG tablet   Commonly known as: TYLENOL   Take 2 tablets (650 mg total) by mouth every 6 (six) hours as needed (or Fever >/= 101).      ALPRAZolam 1 MG tablet   Commonly known as: XANAX   Take 1 mg by mouth 3 (three) times daily.      amitriptyline 50 MG tablet   Commonly known as: ELAVIL   Take 1 tablet (50 mg total) by mouth at bedtime. For pain management, depression, insomnia, and anxiety.      amoxicillin-clavulanate 875-125 MG per tablet   Commonly known as: AUGMENTIN   Take 1 tablet by mouth every 12 (twelve) hours.      carbamazepine 200 MG tablet   Commonly known as: TEGRETOL   Take 1 tablet (200 mg total) by mouth 4 (four) times daily. For management of anxiety and mood      fentaNYL 25 MCG/HR   Commonly known as: DURAGESIC - dosed mcg/hr   Place 1 patch (25 mcg total) onto the skin every 3 (three) days. Place on skin with NO hair or SHAVE the area first. For pain management      gabapentin 300 MG capsule   Commonly known as: NEURONTIN   Take 1 capsule (300 mg total) by mouth 4 (four) times daily. For management of pain and anxiety      lisinopril 20 MG tablet   Commonly known as: PRINIVIL,ZESTRIL   Take 1 tablet (20 mg total) by mouth daily. For control of high blood pressure      meloxicam 7.5 MG tablet   Commonly known as: MOBIC   Take 1 tablet (7.5 mg total) by mouth 2 (two) times daily. For inflammation      metFORMIN 500 MG  tablet   Commonly known as: GLUCOPHAGE   Take 1 tablet (500 mg total) by mouth 2 (two) times daily with a meal. For control of blood sugar      nicotine 21 mg/24hr patch   Commonly known as: NICODERM CQ - dosed in mg/24 hours   Place 1 patch onto the skin daily. For smoking cessation.      QUEtiapine 200 MG tablet   Commonly known as: SEROQUEL   Take 1 tablet (200 mg total) by mouth at bedtime. TAKE at SIX PM for insomnia.  It works for you at that time of day      QUEtiapine 25 MG tablet   Commonly known as: SEROQUEL   Take 1 tablet (25 mg total) by mouth 3 (three) times daily. For anxiety            Discharge Orders    Future Orders Please Complete By Expires   Diet Carb Modified      Activity as tolerated - No restrictions         Follow-up Information    Follow up with your psychiatrist. Schedule an appointment as soon as possible for  a visit in 1 week.         Disposition: 01-Home or Self Care  Discharged Condition: stable  Consults: Treatment Team:  Fredirick Maudlin, MD ACT team  Labs:   pH, Arterial  7.242 7.433 7.418       pCO2 arterial  68.1  41.3 42.4       pO2, Arterial  67.5 73.8 132.0       Bicarbonate  28.3 27.1 26.9       TCO2  25.2 23.2 23.1       Acid-Base Excess  1.6 3.1 2.7       O2 Saturation  90.4 95.1 98.6       Patient temperature  37.0 37.0 37.0       Collection site  LEFT RADIAL RIGHT RADIAL LEFT RADIAL       Allens test (pass/fail)  PASS PASS PASS        CHEM PROFILE    Sodium  134 137 141       Potassium  4.1 3.3  3.3       Chloride  98 100 104       CO2  28 28 29        Mean Plasma Glucose  180         BUN  13 13 9        Creatinine, Ser  0.74 0.83 0.72       Calcium  9.1 8.8 8.9       GFR calc non Af Amer  90 mL/min">90 90 mL/min">90 90 mL/min">90       GFR calc Af Amer  90 mL/min  The eGFR has been calculated using the CKD EPI equation. This calculation has not been validated in all clinical situations. eGFR's  persistently <90 mL/min signify possible Chronic Kidney Disease.">9090 mL/min  The eGFR has been calculated using the CKD EPI equation. This calculation has not been validated in all clinical situations. eGFR's persistently <90 mL/min signify possible Chronic Kidney Disease." border=0 src="file:///C:/PROGRAM%20FILES%20(X86)/EPICSYS/V7.8/EN-US/Images/IP_[email or web address].gif" width=5 height=10 90 mL/min  The eGFR has been calculated using the CKD EPI equation. This calculation has not been validated in all clinical situations. eGFR's persistently <90 mL/min signify possible Chronic Kidney Disease.">9090 mL/min  The eGFR has been calculated using the CKD EPI equation. This calculation has not been validated in all clinical situations. eGFR's persistently <90 mL/min signify possible Chronic Kidney Disease." border=0 src="file:///C:/PROGRAM%20FILES%20(X86)/EPICSYS/V7.8/EN-US/Images/IP_COMMENT_[email or web address].gif" width=5 height=10 90 mL/min  The eGFR has been calculated using the CKD EPI equation. This calculation has not been validated in all clinical situations. eGFR's persistently <90 mL/min signify possible Chronic Kidney Disease.">9090 mL/min  The eGFR has been calculated using the CKD EPI equation. This calculation has not been validated in all clinical situations. eGFR's persistently <90 mL/min signify possible Chronic Kidney Disease." border=0 src="file:///C:/PROGRAM%20FILES%20(X86)/EPICSYS/V7.8/EN-US/Images/IP_COMMENT_[email or web address].gif" width=5 height=10       Glucose, Bld  226 129 190       Alkaline Phosphatase  67 63        Albumin  4.2 3.7        Lipase  20         AST  33 45        ALT  87 67        Total Protein  7.8 7.0        Total Bilirubin  0.3 0.4         CBC    WBC  12.5 9.4 8.4       RBC  5.42  5.12 4.79       Hemoglobin  16.4 15.4 14.1       HCT  46.3 44.4 41.1       MCV  85.4 86.7 85.8       MCH  30.3 30.1 29.4       MCHC  35.4 34.7 34.3       RDW  12.7 12.9 12.7       Platelets  120 104 108         DIFFERENTIAL    Neutrophils Relative  87         Lymphocytes Relative  8         Monocytes Relative  5         Eosinophils Relative  0         Basophils Relative  0         Neutro Abs  10.8         Lymphs Abs  1.0         Monocytes Absolute  0.7         Eosinophils Absolute  0.0         Basophils Absolute  0.0          OTHER DRUGS    Salicylate Lvl  <2.0         Acetaminophen (Tylenol), Serum  150 ug/mL AT 4 HOURS AFTER INGESTION AND 50 ug/mL AT 12 HOURS AFTER INGESTION ARE OFTEN ASSOCIATED WITH TOXIC REACTIONS."<15.0 150 ug/mL AT 4 HOURS AFTER INGESTION AND 50 ug/mL AT 12 HOURS AFTER INGESTION ARE OFTEN ASSOCIATED WITH TOXIC REACTIONS." border=0 src="file:///C:/PROGRAM%20FILES%20(X86)/EPICSYS/V7.8/EN-US/Images/IP_COMMENT_EXIST.gif" width=5 height=10          DIABETES    Hemoglobin A1C  =6.5% Diagnostic of Diabetes Mellitus (if abnormal result is confirmed) 5.7-6.4% Increased risk of developing Diabetes Mellitus References:Diagnosis and Classification of Diabetes Mellitus,Diabetes Care,2011,34(Suppl 1):S62-S69 and Standards of Medical Care in ..."7.9 =6.5% Diagnostic of Diabetes Mellitus (if abnormal result is confirmed) 5.7-6.4% Increased risk of developing Diabetes Mellitus References:Diagnosis and Classification of Diabetes Mellitus,Diabetes Care,2011,34(Suppl 1):S62-S69 and Standards of Medical Care in ..." border=0 src="file:///C:/PROGRAM%20FILES%20(X86)/EPICSYS/V7.8/EN-US/Images/IP_COMMENT_EXIST.gif" width=5 height=10         Glucose, Bld  226 129 190        URINALYSIS    Color, Urine  YELLOW         APPearance  CLEAR         Specific Gravity, Urine  1.030">1.030         pH  5.5         Glucose, UA  500         Bilirubin Urine  NEGATIVE         Ketones, ur  40         Protein, ur  NEGATIVE         Urobilinogen, UA  0.2         Nitrite  NEGATIVE         Leukocytes, UA  NEGATIVE          Hgb urine dipstick  NEGATIVE          TOX, BLOOD    Alcohol, Ethyl (B)  <11            TOX, URINE    Amphetamines  NONE DETECTED         Barbiturates  POSITIVE          Benzodiazepines  POSITIVE         Opiates  NONE DETECTED  Cocaine  POSITIVE         Tetrahydrocannabinol  POSITIVE         Diagnostics:  Ct Head Wo Contrast  06/04/2012  *RADIOLOGY REPORT*  Clinical Data: Altered mental status.  CT HEAD WITHOUT CONTRAST  Technique:  Contiguous axial images were obtained from the base of the skull through the vertex without contrast.  Comparison: CT of the head performed 12/20 30,011  Findings: There is no evidence of acute infarction, mass lesion, or intra- or extra-axial hemorrhage on CT.  Evaluation is mildly suboptimal due to motion artifact.  The posterior fossa, including the cerebellum, brainstem and fourth ventricle, is within normal limits.  The third and lateral ventricles, and basal ganglia are unremarkable in appearance.  The cerebral hemispheres are symmetric in appearance, with normal gray- white differentiation.  No mass effect or midline shift is seen.  There is no evidence of fracture; visualized osseous structures are unremarkable in appearance.  The orbits are within normal limits. The paranasal sinuses and mastoid air cells are well-aerated.  No significant soft tissue abnormalities are seen.  IMPRESSION: Unremarkable noncontrast CT of the head.   Original Report Authenticated By: Tonia Ghent, M.D.    Ct Abdomen Pelvis W Contrast  05/29/2012  *RADIOLOGY REPORT*  Clinical Data: Left lower quadrant and left inguinal pain.  CT ABDOMEN AND PELVIS WITH CONTRAST  Technique:  Multidetector CT imaging of the abdomen and pelvis was performed following the standard protocol during bolus administration of intravenous contrast.  Contrast: OMNIPAQUE IOHEXOL 300 MG/ML  SOLN  Comparison: None  Findings: The lung bases are clear.  No pleural effusion or pulmonary nodule.  The liver is unremarkable.  No focal hepatic lesions or intrahepatic biliary dilatation.   The gallbladder is normal.  No common bile duct dilatation.  The pancreas is normal.  The spleen is normal.  The adrenal glands and kidneys are normal.  The stomach, duodenum, small bowel and colon are unremarkable.  No inflammatory changes or mass lesions.  No mesenteric or retroperitoneal masses or adenopathy. There are scattered lymph nodes.  The appendix is normal.  The aorta is normal in caliber. The major branch vessels are patent.  Mild atherosclerotic changes distally.  The bladder, prostate gland and seminal vesicles are unremarkable. No pelvic mass, adenopathy or free pelvic fluid collections.  No inguinal mass or adenopathy. No inguinal or anterior abdominal wall hernia.  The bony structures are unremarkable.  IMPRESSION: No acute abdominal/pelvic findings, mass lesions or adenopathy.   Original Report Authenticated By: Rudie Meyer, M.D.    Dg Chest Port 1 View  06/05/2012  *RADIOLOGY REPORT*  Clinical Data: Respiratory failure  PORTABLE CHEST - 1 VIEW  Comparison: Yesterday  Findings: Stable endotracheal tube.  NG tube placed with its tip in the fundus of the stomach.  Mild left lung base atelectasis is stable.  Dense consolidation and/or collapse has occurred at the right base involving right lower and middle lobes.  Right upper lobe remains aerated and clear.  Lung apices not included limiting the study.  IMPRESSION: Stable left base atelectasis  New right middle and lower lobe consolidation verse collapse.  NG tube placed into the fundus of the stomach.   Original Report Authenticated By: Jolaine Click, M.D.    Dg Chest Portable 1 View  06/04/2012  *RADIOLOGY REPORT*  Clinical Data: Overdose.  PORTABLE CHEST - 1 VIEW 06/04/2012 0008 hours:  Comparison: Two-view chest x-ray 10/19/2011, 10/17/2010.  Findings: Suboptimal inspiration accounts for crowded bronchovascular markings,  especially in the lung bases, and accentuates the cardiac silhouette.  Taking this into account, cardiomediastinal  silhouette unremarkable and unchanged.  Lungs clear.  IMPRESSION: Suboptimal inspiration.  No acute cardiopulmonary disease.   Original Report Authenticated By: Hulan Saas, M.D.    Dg Chest Port 1v Same Day  06/04/2012  *RADIOLOGY REPORT*  Clinical Data: Hypoxia, overdose, intubation  PORTABLE CHEST - 1 VIEW SAME DAY  Comparison: 06/03/2012  Findings: Endotracheal tube tip at the level of the clavicular heads, 6.5 cm above the level of the carina.  Mild left lung base opacity is likely atelectasis.  Mild associated left hemidiaphragm elevation.  Otherwise, no consolidation, pleural effusion, or pneumothorax.  Cardiomediastinal contours within normal range.  No acute osseous finding.  IMPRESSION: Endotracheal tube tip 6.5 cm proximal to the carina.  Mild left lung base atelectasis.   Original Report Authenticated By: Jearld Lesch, M.D.    Dg Shoulder Left  06/05/2012  *RADIOLOGY REPORT*  Clinical Data: Pain post fall  LEFT SHOULDER - 2+ VIEW  Comparison: 02/14/2010  Findings: AC joint alignment normal. Osseous mineralization normal. No acute fracture, dislocation or bone destruction. Visualized left ribs appear intact.  IMPRESSION: No acute osseous abnormalities. No interval change.   Original Report Authenticated By: Ulyses Southward, M.D.     Procedures: intubation, mechanical ventilation  EKG: sinus Tachycardia. Rate 136.  Full Code   Hospital Course: AP for complete admission details. Mr. Kempner is a 36 year old white male with polysubstance abuse who was brought to the emergency room with altered mental status. Details are sketchy. There was some report regarding overdose of some type. Several admissions to behavioral health previously. It was unresponsive and unable to provide any history. He was protecting his airway initially. However, after admission to the intensive care unit, and it had developing hypoxia, tachycardia and required intubation. ABG revealed a pH of 7.24. Patient was started  on antibiotics for likely aspiration as well. Screen was positive for multiple substances. Cocaine, THC, barbiturates, benzodiazepines. Dr. Juanetta Gosling was consulted to manage the vent. Patient woke up and was able to be extubated. After his mental status improved, he reported that he had been drinking heavily and partying with friends. He did not recall what pills he took but did admit to taking illicit substances. He denied having overdosed on any of his psychiatric medications. He denied suicidal ideation. He denied suicide attempts. Asked team was consulted. Patient has been referred to inpatient alcohol and drug treatment. He is interested in going, but there are no beds available. He is currently medically much improved and stable for discharge. He asked for a prescription for his fentanyl patches. I have declined, as he is not good candidate for opiate prescriptions. He reports he will followup with his psychiatrist and plans on continuing to seek inpatient drug and alcohol treatment. Home on the day of discharge greater than 30 minutes.   Discharge Exam:  Blood pressure 156/94, pulse 67, temperature 97.7 F (36.5 C), temperature source Oral, resp. rate 18, height 6' (1.829 m), weight 90.4 kg (199 lb 4.7 oz), SpO2 98.00%.  General: Alert, oriented, cooperative and pleasant. Lungs clear to auscultation bilaterally without wheeze rhonchi or rales Cardiovascular regular rate rhythm without murmurs gallops rubs Abdomen soft nontender nondistended Extremities no clubbing cyanosis or edema Psychiatric: Normal affect. Good eye contact.  SignedChristiane Ha 06/07/2012, 8:35 AM
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
## 2012-06-12 ENCOUNTER — Ambulatory Visit (HOSPITAL_COMMUNITY): Admission: RE | Admit: 2012-06-12 | Payer: Medicaid Other | Source: Ambulatory Visit

## 2012-06-12 ENCOUNTER — Other Ambulatory Visit (HOSPITAL_COMMUNITY): Payer: Self-pay | Admitting: Internal Medicine

## 2012-06-12 DIAGNOSIS — I861 Scrotal varices: Secondary | ICD-10-CM

## 2012-06-13 ENCOUNTER — Ambulatory Visit (HOSPITAL_COMMUNITY)
Admission: RE | Admit: 2012-06-13 | Discharge: 2012-06-13 | Disposition: A | Discharge status: Home / Self Care | Payer: Medicaid Other | Source: Ambulatory Visit | Attending: Internal Medicine | Admitting: Internal Medicine

## 2012-06-13 DIAGNOSIS — I861 Scrotal varices: Secondary | ICD-10-CM

## 2012-06-30 ENCOUNTER — Emergency Department (HOSPITAL_COMMUNITY)
Admission: EM | Admit: 2012-06-30 | Discharge: 2012-07-01 | Disposition: A | Discharge status: Other Acute Inpatient Hospital | Payer: MEDICAID | Attending: Emergency Medicine | Admitting: Emergency Medicine

## 2012-06-30 ENCOUNTER — Encounter (HOSPITAL_COMMUNITY): Payer: Self-pay | Admitting: *Deleted

## 2012-06-30 DIAGNOSIS — I10 Essential (primary) hypertension: Secondary | ICD-10-CM | POA: Insufficient documentation

## 2012-06-30 DIAGNOSIS — R45851 Suicidal ideations: Secondary | ICD-10-CM | POA: Insufficient documentation

## 2012-06-30 DIAGNOSIS — F3289 Other specified depressive episodes: Secondary | ICD-10-CM | POA: Insufficient documentation

## 2012-06-30 DIAGNOSIS — Z79899 Other long term (current) drug therapy: Secondary | ICD-10-CM | POA: Insufficient documentation

## 2012-06-30 DIAGNOSIS — E119 Type 2 diabetes mellitus without complications: Secondary | ICD-10-CM | POA: Insufficient documentation

## 2012-06-30 DIAGNOSIS — F329 Major depressive disorder, single episode, unspecified: Secondary | ICD-10-CM | POA: Insufficient documentation

## 2012-06-30 DIAGNOSIS — M519 Unspecified thoracic, thoracolumbar and lumbosacral intervertebral disc disorder: Secondary | ICD-10-CM | POA: Insufficient documentation

## 2012-06-30 DIAGNOSIS — R4182 Altered mental status, unspecified: Secondary | ICD-10-CM | POA: Insufficient documentation

## 2012-06-30 DIAGNOSIS — F1910 Other psychoactive substance abuse, uncomplicated: Secondary | ICD-10-CM | POA: Insufficient documentation

## 2012-06-30 DIAGNOSIS — R4589 Other symptoms and signs involving emotional state: Secondary | ICD-10-CM

## 2012-06-30 DIAGNOSIS — Z8619 Personal history of other infectious and parasitic diseases: Secondary | ICD-10-CM | POA: Insufficient documentation

## 2012-06-30 DIAGNOSIS — F17200 Nicotine dependence, unspecified, uncomplicated: Secondary | ICD-10-CM | POA: Insufficient documentation

## 2012-06-30 HISTORY — DX: Other intervertebral disc degeneration, lumbosacral region: M51.37

## 2012-06-30 HISTORY — DX: Other intervertebral disc degeneration, lumbosacral region without mention of lumbar back pain or lower extremity pain: M51.379

## 2012-06-30 LAB — COMPREHENSIVE METABOLIC PANEL
ALT: 39 U/L (ref 0–53)
AST: 26 U/L (ref 0–37)
Alkaline Phosphatase: 57 U/L (ref 39–117)
CO2: 30 mEq/L (ref 19–32)
Chloride: 97 mEq/L (ref 96–112)
GFR calc Af Amer: >90 mL/min (ref >90)
GFR calc non Af Amer: >90 mL/min (ref >90)
Glucose, Bld: 148 mg/dL — ABNORMAL HIGH (ref 70–99)
Potassium: 4.3 mEq/L (ref 3.5–5.1)
Sodium: 137 mEq/L (ref 135–145)

## 2012-06-30 LAB — RAPID URINE DRUG SCREEN, HOSP PERFORMED
Barbiturates: NOT DETECTED
Tetrahydrocannabinol: POSITIVE — AB

## 2012-06-30 LAB — CBC WITH DIFFERENTIAL/PLATELET
Basophils Absolute: 0 10*3/uL (ref 0.0–0.1)
Lymphocytes Relative: 23 % (ref 12–46)
Lymphs Abs: 2.2 10*3/uL (ref 0.7–4.0)
MCV: 85.3 fL (ref 78.0–100.0)
Neutro Abs: 6.3 10*3/uL (ref 1.7–7.7)
Neutrophils Relative %: 65 % (ref 43–77)
Platelets: 140 10*3/uL — ABNORMAL LOW (ref 150–400)
RBC: 5.57 MIL/uL (ref 4.22–5.81)
WBC: 9.6 10*3/uL (ref 4.0–10.5)

## 2012-06-30 MED ORDER — AMITRIPTYLINE HCL 50 MG PO TABS
ORAL_TABLET | ORAL | Status: AC
  Filled 2012-06-30: qty 1

## 2012-06-30 MED ORDER — AMITRIPTYLINE HCL 50 MG PO TABS
50.0000 mg | ORAL_TABLET | Freq: Every day | ORAL | Status: DC
  Administered 2012-06-30: 50 mg via ORAL
  Filled 2012-06-30 (×2): qty 1

## 2012-06-30 MED ORDER — MELOXICAM 7.5 MG PO TABS
7.5000 mg | ORAL_TABLET | Freq: Two times a day (BID) | ORAL | Status: DC
  Filled 2012-06-30 (×4): qty 1

## 2012-06-30 MED ORDER — QUETIAPINE FUMARATE 200 MG PO TABS
200.0000 mg | ORAL_TABLET | Freq: Every day | ORAL | Status: DC
  Administered 2012-06-30: 200 mg via ORAL
  Filled 2012-06-30 (×2): qty 1

## 2012-06-30 MED ORDER — QUETIAPINE FUMARATE 100 MG PO TABS
ORAL_TABLET | ORAL | Status: AC
  Filled 2012-06-30: qty 2

## 2012-06-30 MED ORDER — CARBAMAZEPINE 200 MG PO TABS
200.0000 mg | ORAL_TABLET | Freq: Four times a day (QID) | ORAL | Status: DC
  Administered 2012-06-30: 200 mg via ORAL
  Filled 2012-06-30: qty 1

## 2012-06-30 MED ORDER — FENTANYL 25 MCG/HR TD PT72: 25.0000 ug | MEDICATED_PATCH | TRANSDERMAL | Status: DC

## 2012-06-30 MED ORDER — GABAPENTIN 300 MG PO CAPS
300.0000 mg | ORAL_CAPSULE | Freq: Four times a day (QID) | ORAL | Status: DC
  Administered 2012-06-30: 300 mg via ORAL
  Filled 2012-06-30 (×7): qty 1

## 2012-06-30 MED ORDER — LISINOPRIL 20 MG PO TABS
20.0000 mg | ORAL_TABLET | Freq: Every day | ORAL | Status: DC
  Administered 2012-06-30: 20 mg via ORAL
  Filled 2012-06-30 (×3): qty 1

## 2012-06-30 MED ORDER — NICOTINE 21 MG/24HR TD PT24
21.0000 mg | MEDICATED_PATCH | Freq: Every day | TRANSDERMAL | Status: DC
  Administered 2012-06-30: 21 mg via TRANSDERMAL
  Filled 2012-06-30: qty 1

## 2012-06-30 MED ORDER — ACETAMINOPHEN 325 MG PO TABS: 650.0000 mg | ORAL_TABLET | Freq: Four times a day (QID) | ORAL | Status: DC | PRN

## 2012-06-30 MED ORDER — GABAPENTIN 300 MG PO CAPS
ORAL_CAPSULE | ORAL | Status: AC
  Filled 2012-06-30: qty 1

## 2012-06-30 MED ORDER — LORAZEPAM 1 MG PO TABS: 1.0000 mg | ORAL_TABLET | Freq: Three times a day (TID) | ORAL | Status: DC | PRN

## 2012-06-30 MED ORDER — METFORMIN HCL 500 MG PO TABS: 500.0000 mg | ORAL_TABLET | Freq: Two times a day (BID) | ORAL | Status: DC

## 2012-06-30 MED ORDER — QUETIAPINE FUMARATE 25 MG PO TABS
25.0000 mg | ORAL_TABLET | Freq: Three times a day (TID) | ORAL | Status: DC
  Filled 2012-06-30 (×5): qty 1

## 2012-06-30 MED ORDER — LISINOPRIL 10 MG PO TABS
ORAL_TABLET | ORAL | Status: AC
  Filled 2012-06-30: qty 2

## 2012-06-30 MED ORDER — ALPRAZOLAM 0.5 MG PO TABS
1.0000 mg | ORAL_TABLET | Freq: Three times a day (TID) | ORAL | Status: DC
  Administered 2012-06-30: 1 mg via ORAL
  Filled 2012-06-30: qty 2

## 2012-06-30 NOTE — ED Notes (Signed)
Says he is suicidal, recent adm with overdose.Lt leg has been numb since overdose.  Took overdose of tegretol , etoh, cocaine , marijuana, cannot remember any other meds he took.  Denies any overdose today.

## 2012-06-30 NOTE — ED Notes (Signed)
Medications not in pyxis. Called [facility] to get medications for patient.
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
## 2012-06-30 NOTE — ED Notes (Signed)
Gave patient drink as requested.  

## 2012-07-01 ENCOUNTER — Ambulatory Visit (HOSPITAL_COMMUNITY): Payer: Self-pay | Admitting: Psychiatry

## 2012-07-01 ENCOUNTER — Encounter (HOSPITAL_COMMUNITY): Payer: Self-pay | Admitting: Behavioral Health

## 2012-07-01 ENCOUNTER — Inpatient Hospital Stay (HOSPITAL_COMMUNITY)
Admission: AD | Admit: 2012-07-01 | Discharge: 2012-07-07 | DRG: 885 | Disposition: A | Discharge status: Home / Self Care | Payer: 59 | Source: Ambulatory Visit | Attending: Psychiatry | Admitting: Psychiatry

## 2012-07-01 DIAGNOSIS — F1410 Cocaine abuse, uncomplicated: Secondary | ICD-10-CM | POA: Diagnosis present

## 2012-07-01 DIAGNOSIS — J690 Pneumonitis due to inhalation of food and vomit: Secondary | ICD-10-CM

## 2012-07-01 DIAGNOSIS — F1010 Alcohol abuse, uncomplicated: Secondary | ICD-10-CM | POA: Diagnosis present

## 2012-07-01 DIAGNOSIS — M51379 Other intervertebral disc degeneration, lumbosacral region without mention of lumbar back pain or lower extremity pain: Secondary | ICD-10-CM | POA: Diagnosis present

## 2012-07-01 DIAGNOSIS — Y249XXA Unspecified firearm discharge, undetermined intent, initial encounter: Secondary | ICD-10-CM

## 2012-07-01 DIAGNOSIS — IMO0002 Reserved for concepts with insufficient information to code with codable children: Secondary | ICD-10-CM

## 2012-07-01 DIAGNOSIS — Z720 Tobacco use: Secondary | ICD-10-CM

## 2012-07-01 DIAGNOSIS — E860 Dehydration: Secondary | ICD-10-CM

## 2012-07-01 DIAGNOSIS — D696 Thrombocytopenia, unspecified: Secondary | ICD-10-CM

## 2012-07-01 DIAGNOSIS — M5137 Other intervertebral disc degeneration, lumbosacral region: Secondary | ICD-10-CM | POA: Diagnosis present

## 2012-07-01 DIAGNOSIS — F39 Unspecified mood [affective] disorder: Secondary | ICD-10-CM

## 2012-07-01 DIAGNOSIS — S40019A Contusion of unspecified shoulder, initial encounter: Secondary | ICD-10-CM

## 2012-07-01 DIAGNOSIS — F332 Major depressive disorder, recurrent severe without psychotic features: Principal | ICD-10-CM | POA: Diagnosis present

## 2012-07-01 DIAGNOSIS — R4182 Altered mental status, unspecified: Secondary | ICD-10-CM

## 2012-07-01 DIAGNOSIS — B1920 Unspecified viral hepatitis C without hepatic coma: Secondary | ICD-10-CM | POA: Diagnosis present

## 2012-07-01 DIAGNOSIS — Z79899 Other long term (current) drug therapy: Secondary | ICD-10-CM

## 2012-07-01 DIAGNOSIS — F339 Major depressive disorder, recurrent, unspecified: Secondary | ICD-10-CM

## 2012-07-01 DIAGNOSIS — E119 Type 2 diabetes mellitus without complications: Secondary | ICD-10-CM

## 2012-07-01 DIAGNOSIS — E876 Hypokalemia: Secondary | ICD-10-CM

## 2012-07-01 DIAGNOSIS — I10 Essential (primary) hypertension: Secondary | ICD-10-CM | POA: Diagnosis present

## 2012-07-01 DIAGNOSIS — R112 Nausea with vomiting, unspecified: Secondary | ICD-10-CM | POA: Diagnosis not present

## 2012-07-01 DIAGNOSIS — F1910 Other psychoactive substance abuse, uncomplicated: Secondary | ICD-10-CM

## 2012-07-01 DIAGNOSIS — F411 Generalized anxiety disorder: Secondary | ICD-10-CM

## 2012-07-01 DIAGNOSIS — Z7289 Other problems related to lifestyle: Secondary | ICD-10-CM

## 2012-07-01 DIAGNOSIS — B1710 Acute hepatitis C without hepatic coma: Secondary | ICD-10-CM

## 2012-07-01 DIAGNOSIS — J9600 Acute respiratory failure, unspecified whether with hypoxia or hypercapnia: Secondary | ICD-10-CM

## 2012-07-01 DIAGNOSIS — F1920 Other psychoactive substance dependence, uncomplicated: Secondary | ICD-10-CM

## 2012-07-01 DIAGNOSIS — T50901A Poisoning by unspecified drugs, medicaments and biological substances, accidental (unintentional), initial encounter: Secondary | ICD-10-CM

## 2012-07-01 LAB — URINE MICROSCOPIC-ADD ON

## 2012-07-01 LAB — URINALYSIS, ROUTINE W REFLEX MICROSCOPIC
Bilirubin Urine: NEGATIVE
Hgb urine dipstick: NEGATIVE
Protein, ur: NEGATIVE mg/dL
Urobilinogen, UA: 1 mg/dL (ref 0.0–1.0)

## 2012-07-01 LAB — LIPID PANEL
LDL Cholesterol: 95 mg/dL (ref 0–99)
VLDL: 25 mg/dL (ref 0–40)

## 2012-07-01 LAB — GLUCOSE, CAPILLARY: Glucose-Capillary: 430 mg/dL — ABNORMAL HIGH (ref 70–99)

## 2012-07-01 MED ORDER — AMITRIPTYLINE HCL 50 MG PO TABS: 50.0000 mg | ORAL_TABLET | Freq: Every day | ORAL | Status: DC

## 2012-07-01 MED ORDER — INSULIN ASPART 100 UNIT/ML ~~LOC~~ SOLN
0.0000 [IU] | Freq: Every day | SUBCUTANEOUS | Status: DC
  Administered 2012-07-01: 3 [IU] via SUBCUTANEOUS
  Administered 2012-07-03 – 2012-07-05 (×2): 2 [IU] via SUBCUTANEOUS

## 2012-07-01 MED ORDER — CARBAMAZEPINE 200 MG PO TABS
200.0000 mg | ORAL_TABLET | Freq: Four times a day (QID) | ORAL | Status: DC
  Administered 2012-07-01 – 2012-07-07 (×25): 200 mg via ORAL
  Filled 2012-07-01 (×30): qty 1

## 2012-07-01 MED ORDER — CLONIDINE HCL 0.1 MG PO TABS
0.1000 mg | ORAL_TABLET | ORAL | Status: AC
  Administered 2012-07-03 – 2012-07-04 (×4): 0.1 mg via ORAL
  Filled 2012-07-01 (×4): qty 1

## 2012-07-01 MED ORDER — CLONIDINE HCL 0.1 MG PO TABS
0.1000 mg | ORAL_TABLET | Freq: Four times a day (QID) | ORAL | Status: AC
  Administered 2012-07-01 – 2012-07-03 (×8): 0.1 mg via ORAL
  Filled 2012-07-01 (×9): qty 1

## 2012-07-01 MED ORDER — BENZOCAINE 10 % MT GEL
Freq: Three times a day (TID) | OROMUCOSAL | Status: DC | PRN
  Administered 2012-07-01: 1 via OROMUCOSAL
  Administered 2012-07-01 – 2012-07-02 (×3): via OROMUCOSAL
  Administered 2012-07-02: 1 via OROMUCOSAL
  Administered 2012-07-03 – 2012-07-07 (×4): via OROMUCOSAL
  Filled 2012-07-01 (×2): qty 9.4

## 2012-07-01 MED ORDER — QUETIAPINE FUMARATE 100 MG PO TABS
100.0000 mg | ORAL_TABLET | Freq: Every day | ORAL | Status: DC
  Administered 2012-07-01 – 2012-07-02 (×2): 100 mg via ORAL
  Filled 2012-07-01 (×3): qty 1

## 2012-07-01 MED ORDER — MELOXICAM 7.5 MG PO TABS
7.5000 mg | ORAL_TABLET | Freq: Two times a day (BID) | ORAL | Status: DC
  Administered 2012-07-01 – 2012-07-07 (×13): 7.5 mg via ORAL
  Filled 2012-07-01 (×16): qty 1

## 2012-07-01 MED ORDER — INSULIN ASPART 100 UNIT/ML ~~LOC~~ SOLN
0.0000 [IU] | Freq: Three times a day (TID) | SUBCUTANEOUS | Status: DC
  Administered 2012-07-01: 3 [IU] via SUBCUTANEOUS
  Administered 2012-07-01: 15 [IU] via SUBCUTANEOUS
  Administered 2012-07-01: 2 [IU] via SUBCUTANEOUS
  Administered 2012-07-02: 8 [IU] via SUBCUTANEOUS
  Administered 2012-07-02 – 2012-07-03 (×2): 3 [IU] via SUBCUTANEOUS
  Administered 2012-07-03: 2 [IU] via SUBCUTANEOUS
  Administered 2012-07-04 (×2): 3 [IU] via SUBCUTANEOUS
  Administered 2012-07-04: 5 [IU] via SUBCUTANEOUS
  Administered 2012-07-05: 3 [IU] via SUBCUTANEOUS
  Administered 2012-07-05: 5 [IU] via SUBCUTANEOUS
  Administered 2012-07-05: 3 [IU] via SUBCUTANEOUS
  Administered 2012-07-06: 8 [IU] via SUBCUTANEOUS
  Administered 2012-07-07: 5 [IU] via SUBCUTANEOUS
  Administered 2012-07-07: 3 [IU] via SUBCUTANEOUS

## 2012-07-01 MED ORDER — MAGNESIUM HYDROXIDE 400 MG/5ML PO SUSP: 30.0000 mL | Freq: Every day | ORAL | Status: DC | PRN

## 2012-07-01 MED ORDER — DICYCLOMINE HCL 20 MG PO TABS: 20.0000 mg | ORAL_TABLET | Freq: Four times a day (QID) | ORAL | Status: AC | PRN

## 2012-07-01 MED ORDER — METHOCARBAMOL 500 MG PO TABS
500.0000 mg | ORAL_TABLET | Freq: Three times a day (TID) | ORAL | Status: AC | PRN
  Administered 2012-07-03 – 2012-07-05 (×4): 500 mg via ORAL
  Filled 2012-07-01 (×4): qty 1

## 2012-07-01 MED ORDER — ALUM & MAG HYDROXIDE-SIMETH 200-200-20 MG/5ML PO SUSP: 30.0000 mL | ORAL | Status: DC | PRN

## 2012-07-01 MED ORDER — QUETIAPINE FUMARATE 200 MG PO TABS
200.0000 mg | ORAL_TABLET | Freq: Every day | ORAL | Status: DC
  Filled 2012-07-01: qty 1

## 2012-07-01 MED ORDER — CLONIDINE HCL 0.1 MG PO TABS
0.1000 mg | ORAL_TABLET | Freq: Every day | ORAL | Status: AC
  Administered 2012-07-05 – 2012-07-06 (×2): 0.1 mg via ORAL
  Filled 2012-07-01 (×2): qty 1

## 2012-07-01 MED ORDER — ONDANSETRON 4 MG PO TBDP
4.0000 mg | ORAL_TABLET | Freq: Four times a day (QID) | ORAL | Status: AC | PRN
  Administered 2012-07-04: 4 mg via ORAL

## 2012-07-01 MED ORDER — NICOTINE 21 MG/24HR TD PT24
21.0000 mg | MEDICATED_PATCH | Freq: Every day | TRANSDERMAL | Status: DC
  Administered 2012-07-01 – 2012-07-07 (×8): 21 mg via TRANSDERMAL
  Filled 2012-07-01 (×10): qty 1

## 2012-07-01 MED ORDER — LISINOPRIL 20 MG PO TABS
20.0000 mg | ORAL_TABLET | Freq: Every day | ORAL | Status: DC
  Administered 2012-07-01 – 2012-07-07 (×7): 20 mg via ORAL
  Filled 2012-07-01 (×9): qty 1

## 2012-07-01 MED ORDER — HYDROXYZINE HCL 25 MG PO TABS: 25.0000 mg | ORAL_TABLET | Freq: Four times a day (QID) | ORAL | Status: DC | PRN

## 2012-07-01 MED ORDER — ACETAMINOPHEN 325 MG PO TABS
650.0000 mg | ORAL_TABLET | Freq: Four times a day (QID) | ORAL | Status: DC | PRN
  Administered 2012-07-01 – 2012-07-06 (×2): 650 mg via ORAL

## 2012-07-01 MED ORDER — LOPERAMIDE HCL 2 MG PO CAPS: 2.0000 mg | ORAL_CAPSULE | ORAL | Status: AC | PRN

## 2012-07-01 MED ORDER — METFORMIN HCL 500 MG PO TABS
500.0000 mg | ORAL_TABLET | Freq: Two times a day (BID) | ORAL | Status: DC
  Administered 2012-07-01 – 2012-07-07 (×13): 500 mg via ORAL
  Filled 2012-07-01 (×10): qty 1
  Filled 2012-07-01 (×2): qty 28
  Filled 2012-07-01 (×6): qty 1

## 2012-07-01 NOTE — Progress Notes (Signed)
[facility] Group Notes:  (Counselor/Nursing/MHT/Case Management/Adjunct)  07/01/2012 2:49 PM  Type of Therapy:  Psychoeducational Skills  Participation Level:  Did Not Attend   Summary of Progress/Problems: Matthew [name] did not attend psychoeducation group that focused on using quality time with support systems/individuals to engage in healthy coping skills.   Wandra Scot 07/01/2012, 2:49 PM

## 2012-07-01 NOTE — Progress Notes (Deleted)
Adult Psychosocial Assessment Update Interdisciplinary Team  Previous Behavior Health Hospital admissions/discharges:  Admissions Discharges  Date: 04/21/12 Date: 04/27/12  Date: Date:  Date: Date:  Date: Date:  Date: Date:   Changes since the last Psychosocial Assessment (including adherence to outpatient mental health and/or substance abuse treatment, situational issues contributing to decompensation and/or relapse). Patient reports attempting suicide by ODing on cocaine, alcohol, Heroin and other   Drugs.  He shared he has been battling depression and SI for so long.  He advised of  Feeling that he does not belong in this world.         Discharge Plan 1. Will you be returning to the same living situation after discharge?   Yes: No:      If no, what is your plan?    Patient will return to his home in [place]       2. Would you like a referral for services when you are discharged? Yes:     If yes, for what services?  No:       Patient is followed by Dr. Dan Humphreys at the [facility] outpatient clinic in [place].  Patient states   He is open to a referral for ACT Team      Summary and Recommendations (to be completed by the evaluator) Matthew [name] is a 36 year old Caucasian male who admitted to the hospital following  A suicide attempt. He will benefit from crisis stabilization, evaluation for medication, psycho-education groups for coping skills development, group therapy and case management for discharge planning.                      Signature:  [name], 07/01/2012 12:22 PM

## 2012-07-01 NOTE — [facility] Suicide Risk Assessment (Signed)
Suicide Risk Assessment  Admission Assessment     Nursing information obtained from:  Patient Demographic factors:  Male;Caucasian;Unemployed Current Mental Status:  Suicidal ideation indicated by patient Loss Factors:  NA Historical Factors:  Prior suicide attempts Risk Reduction Factors:  Living with another person, especially a relative  CLINICAL FACTORS:   Depression:   Comorbid alcohol abuse/dependence Hopelessness Impulsivity Alcohol/Substance Abuse/Dependencies  COGNITIVE FEATURES THAT CONTRIBUTE TO RISK:  Thought constriction (tunnel vision)    SUICIDE RISK:   Mild:  Suicidal ideation of limited frequency, intensity, duration, and specificity.  There are no identifiable plans, no associated intent, mild dysphoria and related symptoms, good self-control (both objective and subjective assessment), few other risk factors, and identifiable protective factors, including available and accessible social support.  PLAN OF CARE: Initiate medications as appropriate. Encourage patient to attend groups. Plan discharge with appropriate follow ups once stable.   Matthew [name] 07/01/2012, 11:34 AM

## 2012-07-01 NOTE — Clinical Social Work Note (Addendum)
Discharge Planning Group 8:30-9:30 AM 07/01/2012  Patient seen during discharge planning group.  He endorses SI but contracts for safety. Patient denies.  He rates all symptoms at ten.  Patient shared he attempted suicide two weeks ago and has been in the hospital on a vent.  He states he has no desire to be in this world.  Patient reports having home, outpatient follow up and access to meds.      [facility] Group Notes:  (Counselor/Nursing/MHT/Case Management/Adjunct)      Emotional Regulation 1:15 - 2:30 PM       07/01/2012   1:15   Type of Therapy: Group  Participation Level:  Active   Participation Quality:  Appropriate  Affect:  Depressed/appropriate  Cognitive:  Appropriate  Insight:  Good  Engagement in Group: Good  Engagement in Therapy:  Good  Modes of Intervention:  Education, exploration, problem-solving  Summary of Progress/Problems:  Patient very engaged in group.  He talked about the frustration he feels with not being able to get the help he needs.  Patient states he is here because he wants helps but fears he will eventually kill himself.   [name] 07/01/2012 10:10 AM

## 2012-07-01 NOTE — Progress Notes (Signed)
Adult Psychosocial Assessment Update Interdisciplinary Team  Previous Behavior Health Hospital admissions/discharges:  Admissions Discharges  Date: 04/21/12 Date: 04/27/12  Date: Date:  Date: Date:  Date: Date:  Date: Date:   Changes since the last Psychosocial Assessment (including adherence to outpatient mental health and/or substance abuse treatment, situational issues contributing to decompensation and/or relapse). Patient reports long history of depression.  He shared he overdosed on alchohol  Cocaine, heroin, and other drugs.  Patient shared he does not believe he belongs in this  World         Discharge Plan 1. Will you be returning to the same living situation after discharge?   Yes: No:      If no, what is your plan?    Patient plans to return to his previous living arrangement in [place]       2. Would you like a referral for services when you are discharged? Yes:     If yes, for what services?  No:       Patient has followed with [facility] outpatient clinic.  He shared he is open to a referral  For ACT Team.     Summary and Recommendations (to be completed by the evaluator) Matthew [name] is a 36 years old Caucasian male who admitted to the hospital  Following a suicide attempt.  He will benefit from crisis stabilization, evaluation for medication, psycho-education groups for coping skills development, group therapy and case management for discharge planning.                      Signature:  [name], 07/01/2012 1:03 PM

## 2012-07-01 NOTE — Tx Team (Signed)
Interdisciplinary Treatment Plan Update (Adult)  Date:  07/01/2012  Time Reviewed:  10:00 AM   Progress in Treatment: Attending groups:   Yes   Participating in groups:  Yes Taking medication as prescribed:  Yes Tolerating medication:  Yes Family/Significant othe contact made: Contact to be made with family Patient understands diagnosis:  Yes Discussing patient identified problems/goals with staff: Yes Medical problems stabilized or resolved: Yes Denies suicidal/homicidal ideation: No.  Patient contracts for safety Issues/concerns per patient self-inventory:  Other:   New problem(s) identified:  Reason for Continuation of Hospitalization: Anxiety Depression Medication stabilization Suicidal ideation  Interventions implemented related to continuation of hospitalization:  Medication Management; safety checks q 15 mins  Additional comments:  Estimated length of stay:  3-5 days  Discharge Plan:  Home with outpatient follow up  New goal(s):  Review of initial/current patient goals per problem list:    1.  Goal(s): Eliminate SI/other thoughts of self harm   Met:  No  Target date: d/c  As evidenced by: Patient will no longer endorse SI/HI or other thoughts of self harm.    2.  Goal (s):Reduce depression/anxiety (rated at ten today)  Met: Yes  Target date: d/c  As evidenced by: Patient will rate symptoms at four or below    3.  Goal(s):.stabilize on meds   Met:  No  Target date: d/c  As evidenced by: Patient will report being stabilized on medications - less symptomatic    4.  Goal(s): Refer for outpatient follow up   Met:  No  Target date: d/c  As evidenced by: Follow up appointment to be scheduled    Attendees: Patient:   07/01/2012 10:00 AM  Physican:  Patrick North, MD 07/01/2012 10:00 AM  Nursing:  Berneice Heinrich, RN 07/01/2012 10:00 AM   Nursing:   Nestor Ramp,  RN 07/01/2012 10:00 AM   Clinical Social Worker:  Juline Patch, LCSW  07/01/2012 10:00 AM   Other: Serena Colonel, FNP 07/01/2012 10:00 AM   Other: Teddy Spike, MSW Intern     07/01/2012 10:00 AM Other:  Patton Salles, Clinical Social Worker, LCSW  07/01/2012 10:00 AM Other: Norval Gable, FNP    07/01/2012 10:00 AM

## 2012-07-01 NOTE — Tx Team (Signed)
Initial Interdisciplinary Treatment Plan  PATIENT STRENGTHS: (choose at least two) Ability for insight General fund of knowledge Physical Health  PATIENT STRESSORS: Substance abuse   PROBLEM LIST: Problem List/Patient Goals Date to be addressed Date deferred Reason deferred Estimated date of resolution  Suicidal Ideation 07/01/12     Substance Abuse 07/01/12     Depression 07/01/12                                          DISCHARGE CRITERIA:  Ability to meet basic life and health needs Improved stabilization in mood, thinking, and/or behavior Verbal commitment to aftercare and medication compliance  PRELIMINARY DISCHARGE PLAN: Attend aftercare/continuing care group Attend 12-step recovery group Outpatient therapy  PATIENT/FAMIILY INVOLVEMENT: This treatment plan has been presented to and reviewed with the patient, Matthew [name], and/or family member.  The patient and family have been given the opportunity to ask questions and make suggestions.  Leda Quail T 07/01/2012, 3:02 AM
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
## 2012-07-01 NOTE — Progress Notes (Signed)
  D) Patient quiet but cooperative upon my assessment. Patient resting quietly in room upon my approach. Patient states slept " poor," and  appetite is " poor." Patient rates depression as   "not good" 10, patient rates hopeless feelings as  3/10. Patient endorses "off and on" SI, contracts verbally for safety with RN. Patient denies HI, denies A/V hallucinations.   A) Patient offered support and encouragement, patient encouraged to discuss feelings/concerns with staff. Patient verbalized understanding. Patient monitored Q15 minutes for safety. Patient met with MD to discuss today's goals and plan of care.  R) Patient isolates to room at times, attending some groups in day room and most meals in dining room.  Patient taking medications as ordered. Will continue to monitor.

## 2012-07-01 NOTE — Progress Notes (Signed)
D: Patient in dayroom on approach.  Patient states he feels all right but then stats he is not doing too good.  Patient states he is dealing with the same old stuff.  Patient state she feels the world is not right.  Patient states he is depressed and rates depression 10/10 and anxiety 8/10.  Patient states he has been having passive SI but verbally contracts for safety.  Patient denies HI and denies AVH. A: Staff to monitor Q 15 mins for safety.  Encouragement and support offered.  Scheduled medications administered per orders. R: Patient remains safe on the unit.  Patient attended group tonight. Patient irritable after a telephone conversation.  Patient taking administered medications.

## 2012-07-01 NOTE — Progress Notes (Signed)
Psychoeducational Group Note  Date:  07/01/2012 Time: 2000  Group Topic/Focus:  Wrap-Up Group:   The focus of this group is to help patients review their daily goal of treatment and discuss progress on daily workbooks.  Participation Level:  Active  Participation Quality:  Attentive and Sharing  Affect:  Depressed  Cognitive:  Oriented  Insight:  Lacking  Engagement in Group:  Lacking  Additional Comments:  Patient shared that he had slept most of the day and did not remember much since he had only been in the hospital for one full day.  Priti Consoli, Newton Pigg 07/01/2012, 9:48 PM
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
## 2012-07-02 LAB — GLUCOSE, CAPILLARY
Glucose-Capillary: 175 mg/dL — ABNORMAL HIGH (ref 70–99)
Glucose-Capillary: 251 mg/dL — ABNORMAL HIGH (ref 70–99)

## 2012-07-02 MED ORDER — FLUOXETINE HCL 10 MG PO CAPS
10.0000 mg | ORAL_CAPSULE | Freq: Every day | ORAL | Status: DC
  Administered 2012-07-02 – 2012-07-07 (×6): 10 mg via ORAL
  Filled 2012-07-02 (×8): qty 1

## 2012-07-02 NOTE — Progress Notes (Signed)
Psychoeducational Group Note  Date:  07/02/2012 Time:  1000  Group Topic/Focus:  Making Healthy Choices:   The focus of this group is to help patients identify negative/unhealthy choices they were using prior to admission and identify positive/healthier coping strategies to replace them upon discharge.  Participation Level: Did Not Attend  Participation Quality:  Not Applicable  Affect:  Not Applicable  Cognitive:  Not Applicable  Insight:  Not Applicable  Engagement in Group: Not Applicable  Additional Comments:    Noah Charon 07/02/2012, 12:11 PM

## 2012-07-02 NOTE — Progress Notes (Signed)
Psychoeducational Group Note  Date:  07/02/2012 Time:  2000  Group Topic/Focus:  Karaoke   Participation Level:  Did Not Attend  Participation Quality:    Affect:    Cognitive:    Insight:    Engagement in Group:    Additional Comments:    Bertha Earwood A 07/02/2012, 10:17 PM

## 2012-07-02 NOTE — Progress Notes (Signed)
Patient ID: Matthew [name], male   DOB: 1976/02/06, 36 y.o.   MRN: [id number]  D: Pt denies SI/HI/AVH. Pt is pleasant and cooperative. Pt complains of tooth pain 10 out of 10. Pt seems depressed , and does not open up freely. Pt seems to block information.    A: Pt was offered support and encouragement. Pt was given scheduled medications. Pt was encourage to attend groups. Q 15 minute checks were done for safety. Pt given PRN Orajel for tooth   R:Pt attends groups and interacts well with peers and staff. Pt is taking medication.Pt receptive to treatment and safety maintained on unit.Pt states tooth pain decreased from a 10 to a 6 after taking Orajel.

## 2012-07-02 NOTE — Progress Notes (Signed)
[facility] LCSW Group Therapy  Living a Balanced Life   07/02/2012  1:15  Type of Therapy:  Group Therapy  Participation Level:  Active  Participation Quality:  Appropriate and Attentive  Affect:  Depressed  Cognitive:  Alert and Appropriate  Insight:  Improving  Engagement in Therapy:  Improving  Modes of Intervention:  Clarification, Confrontation, Education, Problem-solving and Support  Summary of Progress/Problems:  Patient was very attentive and engaging in group.  He shared his Pit bull dog is the only thing in life that gave him purpose and balance.  When another patient stated still being alive gave reason for balancing his life, patient responded he like that response and was able to accept that statement and apply to his life.  Following group he asked writer, if it were possible for him to attend SA groups.  Writer spoke with NP who stated she would write an order for patient to participate in SA groups.  [name] 07/02/2012, 2:46 PM

## 2012-07-02 NOTE — Progress Notes (Signed)
Psychoeducational Group Note  Date:  07/02/2012 Time:  1100  Group Topic/Focus:  Overcoming Stress:   The focus of this group is to define stress and help patients assess their triggers.  Participation Level: Did Not Attend  Participation Quality:  Not Applicable  Affect:  Not Applicable  Cognitive:  Not Applicable  Insight:  Not Applicable  Engagement in Group: Not Applicable  Additional Comments:  Patient did not attend group. Patient remained in bed.  Karleen Hampshire Brittini 07/02/2012, 6:46 PM

## 2012-07-02 NOTE — Progress Notes (Signed)
[facility] LCSW Aftercare Discharge Planning Group Note  07/02/2012 11:51 AM  Participation Quality:  Appropriate and Attentive  Affect:  Appropriate, Blunted and Depressed  Cognitive:  Alert and Appropriate  Insight:  Limited  Engagement in Group:  Engaged  Modes of Intervention:  Education, Armed forces logistics/support/administrative officer of Progress/Problems: Patient states his symptoms are the same as yesterday.  He rates depression at eight/nine, anxiety at eight/nine, hopelessness and helplessness at ten.  Patient endorses SI, he contracts for safety.   Tyaira Heward L 07/02/2012, 11:51 AM

## 2012-07-02 NOTE — Progress Notes (Signed)
  D) Patient quiet but cooperative upon my assessment. Patient resting quietly in bed upon my approach. Patient states slept "okay,after taking medication" and  appetite is " improving." Patient rates depression as   8/10, patient rates hopeless feelings as 8 /10. Patient endorses passive SI, contracts verbally with RN for safety. Patient denies HI, denies A/V hallucinations.   A) Patient offered support and encouragement, patient encouraged to discuss feelings/concerns with staff. Patient verbalized understanding. Patient monitored Q15 minutes for safety. Patient met with MD to discuss today's goals and plan of care.  R) Patient active on unit, attending most groups in day room and meals in dining room.  Patient taking medications as ordered. Will continue to monitor.

## 2012-07-02 NOTE — Progress Notes (Signed)
[facility] MD Progress Note  07/02/2012 12:52 PM Matthew [name]  MRN:  [id number] Subjective:  Patient seen, reports feeling depressed and anxious. Continues to have suicidal thoughts. Diagnosis:   Axis I: Depressive Disorder NOS, Substance dependence Axis II: Deferred Axis III:  Past Medical History  Diagnosis Date  . Hypertension   . Diabetes mellitus   . Degenerative disc disease   . Depression     bipolar  . Hepatitis C   . Substance abuse   . DDD (degenerative disc disease), lumbosacral    Axis IV: occupational problems and other psychosocial or environmental problems Axis V: 51-60 moderate symptoms  ADL's:  Intact  Sleep: Fair  Appetite:  Fair   Psychiatric Specialty Exam: Review of Systems  Constitutional: Negative.   Eyes: Negative.   Respiratory: Positive for cough.   Cardiovascular: Negative.   Gastrointestinal: Negative.   Genitourinary: Negative.   Musculoskeletal: Positive for back pain and joint pain.  Skin: Negative.   Neurological: Positive for headaches.  Endo/Heme/Allergies: Negative.   Psychiatric/Behavioral: Positive for depression and suicidal ideas. The patient is nervous/anxious and has insomnia.     Blood pressure 141/94, pulse 70, temperature 98 F (36.7 C), temperature source Oral, resp. rate 18, weight 85.73 kg (189 lb).There is no height on file to calculate BMI.  General Appearance: Casual  Eye Contact::  Fair  Speech:  Clear and Coherent  Volume:  Normal  Mood:  Depressed  Affect:  Constricted  Thought Process:  Coherent  Orientation:  Full (Time, Place, and Person)  Thought Content:  WDL  Suicidal Thoughts:  Yes.  without intent/plan  Homicidal Thoughts:  No  Memory:  Immediate;   Fair Recent;   Fair Remote;   Fair  Judgement:  Impaired  Insight:  Present  Psychomotor Activity:  Normal  Concentration:  Fair  Recall:  Fair  Akathisia:  No  Handed:  Right  AIMS (if indicated):     Assets:  Communication Skills  Sleep:  Number of  Hours: 6.75    Current Medications: Current Facility-Administered Medications  Medication Dose Route Frequency Provider Last Rate Last Dose  . acetaminophen (TYLENOL) tablet 650 mg  650 mg Oral Q6H PRN Kerry Hough, PA   650 mg at 07/01/12 1426  . alum & mag hydroxide-simeth (MAALOX/MYLANTA) 200-200-20 MG/5ML suspension 30 mL  30 mL Oral Q4H PRN Kerry Hough, PA      . benzocaine (ORAJEL) 10 % mucosal gel   Mouth/Throat TID PRN Sanjuana Kava, NP      . carbamazepine (TEGRETOL) tablet 200 mg  200 mg Oral QID Kerry Hough, PA   200 mg at 07/02/12 1202  . cloNIDine (CATAPRES) tablet 0.1 mg  0.1 mg Oral QID Kerry Hough, PA   0.1 mg at 07/02/12 1202   Followed by  . cloNIDine (CATAPRES) tablet 0.1 mg  0.1 mg Oral BH-qamhs Kerry Hough, PA       Followed by  . cloNIDine (CATAPRES) tablet 0.1 mg  0.1 mg Oral QAC breakfast Kerry Hough, PA      . dicyclomine (BENTYL) tablet 20 mg  20 mg Oral Q6H PRN Kerry Hough, PA      . insulin aspart (novoLOG) injection 0-15 Units  0-15 Units Subcutaneous TID WC Kerry Hough, PA   3 Units at 07/02/12 [phone]  . insulin aspart (novoLOG) injection 0-5 Units  0-5 Units Subcutaneous QHS Kerry Hough, PA   3 Units at 07/01/12 2113  .  lisinopril (PRINIVIL,ZESTRIL) tablet 20 mg  20 mg Oral Daily Kerry Hough, PA   20 mg at 07/02/12 1610  . loperamide (IMODIUM) capsule 2-4 mg  2-4 mg Oral PRN Kerry Hough, PA      . magnesium hydroxide (MILK OF MAGNESIA) suspension 30 mL  30 mL Oral Daily PRN Kerry Hough, PA      . meloxicam (MOBIC) tablet 7.5 mg  7.5 mg Oral BID Kerry Hough, PA   7.5 mg at 07/02/12 0738  . metFORMIN (GLUCOPHAGE) tablet 500 mg  500 mg Oral BID WC Kerry Hough, PA   500 mg at 07/02/12 9604  . methocarbamol (ROBAXIN) tablet 500 mg  500 mg Oral Q8H PRN Kerry Hough, PA      . nicotine (NICODERM CQ - dosed in mg/24 hours) patch 21 mg  21 mg Transdermal Daily Kerry Hough, PA   21 mg at 07/02/12 0645  .  ondansetron (ZOFRAN-ODT) disintegrating tablet 4 mg  4 mg Oral Q6H PRN Kerry Hough, PA      . QUEtiapine (SEROQUEL) tablet 100 mg  100 mg Oral QHS Hailea Eaglin, MD   100 mg at 07/01/12 2113    Lab Results:  Results for orders placed during the hospital encounter of 07/01/12 (from the past 48 hour(s))  GLUCOSE, CAPILLARY     Status: Abnormal   Collection Time   07/01/12  6:16 AM      Component Value Range Comment   Glucose-Capillary 180 (*) 70 - 99 mg/dL   HEMOGLOBIN V4U     Status: Abnormal   Collection Time   07/01/12  6:30 AM      Component Value Range Comment   Hemoglobin A1C 8.2 (*) <5.7 %    Mean Plasma Glucose 189 (*) <117 mg/dL   TSH     Status: Normal   Collection Time   07/01/12  6:30 AM      Component Value Range Comment   TSH 0.756  0.350 - 4.500 uIU/mL   LIPID PANEL     Status: Abnormal   Collection Time   07/01/12  6:30 AM      Component Value Range Comment   Cholesterol 159  0 - 200 mg/dL    Triglycerides 981  <191 mg/dL    HDL 39 (*) >47 mg/dL    Total CHOL/HDL Ratio 4.1      VLDL 25  0 - 40 mg/dL    LDL Cholesterol 95  0 - 99 mg/dL   GLUCOSE, CAPILLARY     Status: Abnormal   Collection Time   07/01/12 11:50 AM      Component Value Range Comment   Glucose-Capillary 430 (*) 70 - 99 mg/dL    Comment 1 Notify RN     GLUCOSE, CAPILLARY     Status: Abnormal   Collection Time   07/01/12  5:05 PM      Component Value Range Comment   Glucose-Capillary 137 (*) 70 - 99 mg/dL    Comment 1 Notify RN     URINALYSIS, ROUTINE W REFLEX MICROSCOPIC     Status: Abnormal   Collection Time   07/01/12  5:25 PM      Component Value Range Comment   Color, Urine YELLOW  YELLOW    APPearance CLEAR  CLEAR    Specific Gravity, Urine 1.037 (*) 1.005 - 1.030    pH 6.0  5.0 - 8.0    Glucose, UA >1000 (*) NEGATIVE  mg/dL    Hgb urine dipstick NEGATIVE  NEGATIVE    Bilirubin Urine NEGATIVE  NEGATIVE    Ketones, ur TRACE (*) NEGATIVE mg/dL    Protein, ur NEGATIVE  NEGATIVE mg/dL     Urobilinogen, UA 1.0  0.0 - 1.0 mg/dL    Nitrite NEGATIVE  NEGATIVE    Leukocytes, UA NEGATIVE  NEGATIVE   URINE MICROSCOPIC-ADD ON     Status: Abnormal   Collection Time   07/01/12  5:25 PM      Component Value Range Comment   Squamous Epithelial / LPF FEW (*) RARE    WBC, UA 0-2  <3 WBC/hpf    Bacteria, UA RARE  RARE    Urine-Other MUCOUS PRESENT     GLUCOSE, CAPILLARY     Status: Abnormal   Collection Time   07/01/12  9:02 PM      Component Value Range Comment   Glucose-Capillary 271 (*) 70 - 99 mg/dL   GLUCOSE, CAPILLARY     Status: Abnormal   Collection Time   07/02/12  6:16 AM      Component Value Range Comment   Glucose-Capillary 175 (*) 70 - 99 mg/dL   GLUCOSE, CAPILLARY     Status: Abnormal   Collection Time   07/02/12 11:53 AM      Component Value Range Comment   Glucose-Capillary 189 (*) 70 - 99 mg/dL    Comment 1 Notify RN       Physical Findings: AIMS: Facial and Oral Movements Muscles of Facial Expression: None, normal Lips and Perioral Area: None, normal Jaw: None, normal Tongue: None, normal,Extremity Movements Upper (arms, wrists, hands, fingers): None, normal Lower (legs, knees, ankles, toes): None, normal, Trunk Movements Neck, shoulders, hips: None, normal, Overall Severity Severity of abnormal movements (highest score from questions above): None, normal Incapacitation due to abnormal movements: None, normal Patient's awareness of abnormal movements (rate only patient's report): No Awareness, Dental Status Current problems with teeth and/or dentures?: No Does patient usually wear dentures?: No  CIWA:  CIWA-Ar Total: 1  COWS:  COWS Total Score: 0   Treatment Plan Summary: Daily contact with patient to assess and evaluate symptoms and progress in treatment Medication management  Plan: Continue current plan of care. Start patient on Prozac to address mood symptoms. Encourage attending groups.  Medical Decision Making Problem Points:  Established  problem, stable/improving (1), Review of last therapy session (1) and Review of psycho-social stressors (1) Data Points:  Review of medication regiment & side effects (2) Review of new medications or change in dosage (2)  I certify that inpatient services furnished can reasonably be expected to improve the patient's condition.   Matthew [name] 07/02/2012, 12:52 PM

## 2012-07-03 DIAGNOSIS — F1920 Other psychoactive substance dependence, uncomplicated: Secondary | ICD-10-CM

## 2012-07-03 DIAGNOSIS — F329 Major depressive disorder, single episode, unspecified: Secondary | ICD-10-CM

## 2012-07-03 LAB — GLUCOSE, CAPILLARY

## 2012-07-03 MED ORDER — QUETIAPINE FUMARATE 100 MG PO TABS
100.0000 mg | ORAL_TABLET | Freq: Every day | ORAL | Status: DC
  Administered 2012-07-03: 100 mg via ORAL
  Filled 2012-07-03 (×3): qty 1

## 2012-07-03 NOTE — Progress Notes (Signed)
  D) Patient quiet but cooperative upon my assessment. Patient appears brighter this morning, states "whatever medicine you are giving me is working and I feel so much better today." Patient states slept "well," and  appetite is "good." Patient rates depression as   4/10, patient rates hopeless feelings as  3/10. Patient denies SI/HI, denies A/V hallucinations.   A) Patient offered support and encouragement, patient encouraged to discuss feelings/concerns with staff. Patient verbalized understanding. Patient monitored Q15 minutes for safety. Patient met with MD to discuss today's goals and plan of care.  R) Patient active on unit, attending groups in day room and meals in dining room.  Patient taking medications as ordered. Will continue to monitor.

## 2012-07-03 NOTE — Progress Notes (Signed)
Patient ID: Matthew [name], male   DOB: 10/19/1975, 36 y.o.   MRN: [id number]  D: Pt continues to have Passive SI, but contracts for safety. Pt denies HI/AVH. Pt is pleasant and cooperative. Pt is very anxious, but feels better since his roommate moved to another hall.     A: Pt was offered support and encouragement. Pt was given scheduled medications. Pt was encourage to attend groups. Q 15 minute checks were done for safety. Pt given PRN Robaxin for leg pain that he said was 9 out of 10  R:Pt attends groups and interacts well with peers and staff. Pt is taking medication. Pt has no complaints at this time.Pt receptive to treatment and safety maintained on unit.

## 2012-07-03 NOTE — Progress Notes (Signed)
Inpatient Diabetes Program Recommendations  AACE/ADA: New Consensus Statement on Inpatient Glycemic Control (2013)  Target Ranges:  Prepandial:   less than 140 mg/dL      Peak postprandial:   less than 180 mg/dL (1-2 hours)      Critically ill patients:  140 - 180 mg/dL   Reason for Visit: Hyperglycemia, Elevated HgbA1C  Results for DENIRO, LAYMON (MRN [id number]) as of 07/03/2012 09:44  Ref. Range 07/01/2012 06:30  Hemoglobin A1C Latest Range: <5.7 % 8.2 (H)  Results for CHANTRY, HEADEN (MRN [id number]) as of 07/03/2012 09:44  Ref. Range 06/06/2012 21:08 06/07/2012 07:20 07/01/2012 06:16 07/01/2012 11:50 07/01/2012 17:05 07/01/2012 21:02 07/02/2012 06:16 07/02/2012 11:53 07/02/2012 17:13 07/02/2012 21:25 07/03/2012 06:12  Glucose-Capillary Latest Range: 70-99 mg/dL 782 (H) 956 (H) 213 (H) 430 (H) 137 (H) 271 (H) 175 (H) 189 (H) 251 (H) 148 (H) 144 (H)    HgbA1C reveals sub-optimal control at home.  Will need to f/u with PCP for adjustments in diabetes meds.    Inpatient Diabetes Program Recommendations Insulin - Meal Coverage: Consider adding meal coverage insulin - Novolog 3 units tidwc Oral Agents: On metformin 500 bid Diet: Encourage pt to make healthy food choices and appropriate portion sizes in cafeteria  Will continue to follow.

## 2012-07-03 NOTE — Progress Notes (Signed)
[facility] LCSW Group Therapy      Discharge Planning Group 8:30-9:30 AM   07/03/2012 10:42 AM  Type of Therapy:  Discharge Planning  Participation Level:  Active  Participation Quality:  Appropriate  Affect:  Appropriate  Cognitive:  Appropriate  Insight:  Engaged  Engagement in Therapy:  Engaged  Modes of Intervention:  Education, Exploration, Problem-solving and Support  Summary of Progress/Problems:  Patient reports being better today.  He denies SI/HI and rates symptoms at two or three.  He shared statements other patients are making about life and being alive is having a positive impact on him.  [name] 07/03/2012, 10:42 AM

## 2012-07-03 NOTE — Progress Notes (Signed)
[facility] LCSW Group Therapy        Feelings Around Relapse   07/03/2012 1:15  Type of Therapy:  Group Therapy  Participation Level:  Active  Participation Quality:  Appropriate and Attentive  Affect:  Appropriate and Depressed  Cognitive:  Appropriate  Insight:  Engaged  Engagement in Therapy:  Engaged  Modes of Intervention:  Clarification, Education, Problem-solving and Support  Summary of Progress/Problems:Patient very engaged in group.  He shared relapse for him is returning to a place of darkness with negative thoughts, feelings, hurt and pain.  [name] 07/03/2012, 2:39 PM

## 2012-07-03 NOTE — Progress Notes (Signed)
Patient ID: Matthew [name], male   DOB: Dec 19, 1975, 36 y.o.   MRN: [id number] [facility] MD Progress Note  07/03/2012 11:04 AM Matthew [name]  MRN:  [id number]  Subjective:  Patient seen, reports feeling depressed and anxious. Continues to have suicidal thoughts.  Diagnosis:   Axis I: Depressive Disorder NOS, Substance dependence Axis II: Deferred Axis III:  Past Medical History  Diagnosis Date  . Hypertension   . Diabetes mellitus   . Degenerative disc disease   . Depression     bipolar  . Hepatitis C   . Substance abuse   . DDD (degenerative disc disease), lumbosacral    Axis IV: occupational problems and other psychosocial or environmental problems Axis V: 51-60 moderate symptoms  ADL's:  Intact  Sleep: Fair ImprovingI Appetite:  Fair Improving  Patient states that he feels like he is improving.  Rates anxiety and depression 3/10.  Patient states that he is planning on continuing therapy once D/C with Dr. Dan Humphreys in [place].    Psychiatric Specialty Exam: Review of Systems  Constitutional: Negative.   Eyes: Negative.   Respiratory: Positive for cough.   Cardiovascular: Negative.   Gastrointestinal: Negative.   Genitourinary: Negative.   Musculoskeletal: Positive for back pain and joint pain.  Skin: Negative.   Neurological: Positive for headaches.  Endo/Heme/Allergies: Negative.   Psychiatric/Behavioral: Positive for depression and suicidal ideas. The patient is nervous/anxious and has insomnia.     Blood pressure 150/81, pulse 80, temperature 98 F (36.7 C), temperature source Oral, resp. rate 18, weight 85.73 kg (189 lb).There is no height on file to calculate BMI.  General Appearance: Casual  Eye Contact::  Fair  Speech:  Clear and Coherent  Volume:  Normal  Mood:  Depressed  Affect:  Constricted  Thought Process:  Coherent  Orientation:  Full (Time, Place, and Person)  Thought Content:  WDL  Suicidal Thoughts:  Yes.  without intent/plan  Homicidal Thoughts:   No  Memory:  Immediate;   Fair Recent;   Fair Remote;   Fair  Judgement:  Impaired  Insight:  Present  Psychomotor Activity:  Normal  Concentration:  Fair  Recall:  Fair  Akathisia:  No  Handed:  Right  AIMS (if indicated):     Assets:  Communication Skills  Sleep:  Number of Hours: 6    Current Medications: Current Facility-Administered Medications  Medication Dose Route Frequency Provider Last Rate Last Dose  . acetaminophen (TYLENOL) tablet 650 mg  650 mg Oral Q6H PRN Kerry Hough, PA   650 mg at 07/01/12 1426  . alum & mag hydroxide-simeth (MAALOX/MYLANTA) 200-200-20 MG/5ML suspension 30 mL  30 mL Oral Q4H PRN Kerry Hough, PA      . benzocaine (ORAJEL) 10 % mucosal gel   Mouth/Throat TID PRN Sanjuana Kava, NP      . carbamazepine (TEGRETOL) tablet 200 mg  200 mg Oral QID Kerry Hough, PA   200 mg at 07/03/12 0736  . [COMPLETED] cloNIDine (CATAPRES) tablet 0.1 mg  0.1 mg Oral QID Kerry Hough, PA   0.1 mg at 07/03/12 3329   Followed by  . cloNIDine (CATAPRES) tablet 0.1 mg  0.1 mg Oral BH-qamhs Kerry Hough, PA   0.1 mg at 07/03/12 5188   Followed by  . cloNIDine (CATAPRES) tablet 0.1 mg  0.1 mg Oral QAC breakfast Kerry Hough, PA      . dicyclomine (BENTYL) tablet 20 mg  20 mg Oral Q6H PRN  Kerry Hough, PA      . FLUoxetine (PROZAC) capsule 10 mg  10 mg Oral Daily Himabindu Ravi, MD   10 mg at 07/03/12 0736  . insulin aspart (novoLOG) injection 0-15 Units  0-15 Units Subcutaneous TID WC Kerry Hough, PA   2 Units at 07/03/12 [id number]  . insulin aspart (novoLOG) injection 0-5 Units  0-5 Units Subcutaneous QHS Kerry Hough, PA   3 Units at 07/01/12 2113  . lisinopril (PRINIVIL,ZESTRIL) tablet 20 mg  20 mg Oral Daily Kerry Hough, PA   20 mg at 07/03/12 0736  . loperamide (IMODIUM) capsule 2-4 mg  2-4 mg Oral PRN Kerry Hough, PA      . magnesium hydroxide (MILK OF MAGNESIA) suspension 30 mL  30 mL Oral Daily PRN Kerry Hough, PA      . meloxicam  (MOBIC) tablet 7.5 mg  7.5 mg Oral BID Kerry Hough, PA   7.5 mg at 07/03/12 0736  . metFORMIN (GLUCOPHAGE) tablet 500 mg  500 mg Oral BID WC Kerry Hough, PA   500 mg at 07/03/12 0736  . methocarbamol (ROBAXIN) tablet 500 mg  500 mg Oral Q8H PRN Kerry Hough, PA   500 mg at 07/03/12 1040  . nicotine (NICODERM CQ - dosed in mg/24 hours) patch 21 mg  21 mg Transdermal Daily Kerry Hough, PA   21 mg at 07/03/12 1191  . ondansetron (ZOFRAN-ODT) disintegrating tablet 4 mg  4 mg Oral Q6H PRN Kerry Hough, PA      . QUEtiapine (SEROQUEL) tablet 100 mg  100 mg Oral QHS Himabindu Ravi, MD   100 mg at 07/02/12 2124    Lab Results:  Results for orders placed during the hospital encounter of 07/01/12 (from the past 48 hour(s))  GLUCOSE, CAPILLARY     Status: Abnormal   Collection Time   07/01/12 11:50 AM      Component Value Range Comment   Glucose-Capillary 430 (*) 70 - 99 mg/dL    Comment 1 Notify RN     GLUCOSE, CAPILLARY     Status: Abnormal   Collection Time   07/01/12  5:05 PM      Component Value Range Comment   Glucose-Capillary 137 (*) 70 - 99 mg/dL    Comment 1 Notify RN     URINALYSIS, ROUTINE W REFLEX MICROSCOPIC     Status: Abnormal   Collection Time   07/01/12  5:25 PM      Component Value Range Comment   Color, Urine YELLOW  YELLOW    APPearance CLEAR  CLEAR    Specific Gravity, Urine 1.037 (*) 1.005 - 1.030    pH 6.0  5.0 - 8.0    Glucose, UA >1000 (*) NEGATIVE mg/dL    Hgb urine dipstick NEGATIVE  NEGATIVE    Bilirubin Urine NEGATIVE  NEGATIVE    Ketones, ur TRACE (*) NEGATIVE mg/dL    Protein, ur NEGATIVE  NEGATIVE mg/dL    Urobilinogen, UA 1.0  0.0 - 1.0 mg/dL    Nitrite NEGATIVE  NEGATIVE    Leukocytes, UA NEGATIVE  NEGATIVE   URINE MICROSCOPIC-ADD ON     Status: Abnormal   Collection Time   07/01/12  5:25 PM      Component Value Range Comment   Squamous Epithelial / LPF FEW (*) RARE    WBC, UA 0-2  <3 WBC/hpf    Bacteria, UA RARE  RARE    Urine-Other  MUCOUS PRESENT     GLUCOSE, CAPILLARY     Status: Abnormal   Collection Time   07/01/12  9:02 PM      Component Value Range Comment   Glucose-Capillary 271 (*) 70 - 99 mg/dL   GLUCOSE, CAPILLARY     Status: Abnormal   Collection Time   07/02/12  6:16 AM      Component Value Range Comment   Glucose-Capillary 175 (*) 70 - 99 mg/dL   GLUCOSE, CAPILLARY     Status: Abnormal   Collection Time   07/02/12 11:53 AM      Component Value Range Comment   Glucose-Capillary 189 (*) 70 - 99 mg/dL    Comment 1 Notify RN     GLUCOSE, CAPILLARY     Status: Abnormal   Collection Time   07/02/12  5:13 PM      Component Value Range Comment   Glucose-Capillary 251 (*) 70 - 99 mg/dL    Comment 1 Notify RN     GLUCOSE, CAPILLARY     Status: Abnormal   Collection Time   07/02/12  9:25 PM      Component Value Range Comment   Glucose-Capillary 148 (*) 70 - 99 mg/dL    Comment 1 Notify RN      Comment 2 Documented in Chart     GLUCOSE, CAPILLARY     Status: Abnormal   Collection Time   07/03/12  6:12 AM      Component Value Range Comment   Glucose-Capillary 144 (*) 70 - 99 mg/dL     Physical Findings: AIMS: Facial and Oral Movements Muscles of Facial Expression: None, normal Lips and Perioral Area: None, normal Jaw: None, normal Tongue: None, normal,Extremity Movements Upper (arms, wrists, hands, fingers): None, normal Lower (legs, knees, ankles, toes): None, normal, Trunk Movements Neck, shoulders, hips: None, normal, Overall Severity Severity of abnormal movements (highest score from questions above): None, normal Incapacitation due to abnormal movements: None, normal Patient's awareness of abnormal movements (rate only patient's report): No Awareness, Dental Status Current problems with teeth and/or dentures?: No Does patient usually wear dentures?: No  CIWA:  CIWA-Ar Total: 2  COWS:  COWS Total Score: 0   Treatment Plan Summary: Daily contact with patient to assess and evaluate symptoms and  progress in treatment Medication management  Plan: Continue current plan of care. Start patient on Prozac to address mood symptoms. Encourage attending groups.  Medical Decision Making Problem Points:  Established problem, stable/improving (1), Review of last therapy session (1) and Review of psycho-social stressors (1) Data Points:  Review of medication regiment & side effects (2) Review of new medications or change in dosage (2)  I certify that inpatient services furnished can reasonably be expected to improve the patient's condition.   Rankin, Shuvon 07/03/2012, 11:04 AM

## 2012-07-03 NOTE — Progress Notes (Signed)
[facility] INPATIENT:  Family/Significant Other Suicide Prevention Education  Suicide Prevention Education:  Patient Refusal for Family/Significant Other Suicide Prevention Education: The patient Matthew [name] has refused to provide written consent for family/significant other to be provided Family/Significant Other Suicide Prevention Education during admission and/or prior to discharge.  Physician notified.  Patient declined stating there is no one but him.  [name] 07/03/2012, 12:27 PM

## 2012-07-03 NOTE — Tx Team (Signed)
Interdisciplinary Treatment Plan Update (Adult)  Date:  07/03/2012  Time Reviewed:  10:03 AM   Progress in Treatment: Attending groups:   Yes   Participating in groups:  Yes Taking medication as prescribed:  Yes Tolerating medication:  Yes Family/Significant othe contact made: Contact to be made with family Patient understands diagnosis:  Yes Discussing patient identified problems/goals with staff: Yes Medical problems stabilized or resolved: Yes Denies suicidal/homicidal ideation:Yes Issues/concerns per patient self-inventory:  Other:   New problem(s) identified:  Reason for Continuation of Hospitalization: Anxiety Depression Medication stabilization  Interventions implemented related to continuation of hospitalization:  Medication Management; safety checks q 15 mins  Additional comments:  Estimated length of stay:  2-3 days  Discharge Plan:  Home with outpatient follow up  New goal(s):  Review of initial/current patient goals per problem list:    1.  Goal(s): Eliminate SI/other thoughts of self harm   Met:  Yes  Target date: d/c  As evidenced by: Patient no longer endorsing SI/HI or other thoughts of self harm.    2.  Goal (s):Reduce depression/anxiety  (rated at two/three today)  Met: Yes  Target date: d/c  As evidenced by: Patient currently rating symptoms at four or below    3.  Goal(s):.stabilize on meds   Met:  No  Target date: d/c  As evidenced by: Patient reports being stabilized on medications - less symptomatic    4.  Goal(s): Refer for outpatient follow up   Met:  Yes  Target date: d/c  As evidenced by: Follow up appointment scheduled    Attendees: Patient:   07/03/2012 10:03 AM  Physican:  Patrick North, MD 07/03/2012 10:03 AM  Nursing:  Nestor Ramp RN 07/03/2012 10:03 AM   Nursing:   Berneice Heinrich, RN 07/03/2012 10:03 AM   Clinical Social Worker:  Juline Patch, LCSW 07/03/2012 10:03 AM   Other: Serena Colonel, FNP 07/03/2012  10:03 AM   Other:  Patton Salles, Clinical Social Worker, LCSW  07/03/2012 10:03 AM Other:        07/03/2012 10:03 AM

## 2012-07-04 DIAGNOSIS — F1910 Other psychoactive substance abuse, uncomplicated: Secondary | ICD-10-CM

## 2012-07-04 DIAGNOSIS — F332 Major depressive disorder, recurrent severe without psychotic features: Principal | ICD-10-CM

## 2012-07-04 LAB — GLUCOSE, CAPILLARY
Glucose-Capillary: 203 mg/dL — ABNORMAL HIGH (ref 70–99)
Glucose-Capillary: 185 mg/dL — ABNORMAL HIGH (ref 70–99)
Glucose-Capillary: 158 mg/dL — ABNORMAL HIGH (ref 70–99)

## 2012-07-04 MED ORDER — GABAPENTIN 300 MG PO CAPS
300.0000 mg | ORAL_CAPSULE | Freq: Three times a day (TID) | ORAL | Status: DC
  Administered 2012-07-04 – 2012-07-07 (×8): 300 mg via ORAL
  Filled 2012-07-04 (×12): qty 1

## 2012-07-04 MED ORDER — QUETIAPINE FUMARATE 50 MG PO TABS
150.0000 mg | ORAL_TABLET | Freq: Every day | ORAL | Status: DC
  Administered 2012-07-04 – 2012-07-06 (×3): 150 mg via ORAL
  Filled 2012-07-04 (×4): qty 1

## 2012-07-04 MED ORDER — GABAPENTIN 300 MG PO CAPS
ORAL_CAPSULE | ORAL | Status: AC
  Filled 2012-07-04: qty 1

## 2012-07-04 NOTE — Progress Notes (Signed)
[facility] MD Progress Note  07/04/2012 1:11 PM Matthew [name]  MRN:  [id number] Subjective:   Diagnosis:   Axis I: Major Depression, Recurrent severe Axis II: Deferred Axis III:  Past Medical History  Diagnosis Date  . Hypertension   . Diabetes mellitus   . Degenerative disc disease   . Depression     bipolar  . Hepatitis C   . Substance abuse   . DDD (degenerative disc disease), lumbosacral    Axis IV: economic problems, occupational problems, other psychosocial or environmental problems, problems related to social environment and problems with primary support group Axis V: 41-50 serious symptoms  ADL's:  Intact  Sleep: Poor  Appetite:  Poor  Suicidal Ideation:  Denies Homicidal Ideation:   AEB (as evidenced by):  Psychiatric Specialty Exam: Review of Systems  Psychiatric/Behavioral: Memory loss: Denies.    Blood pressure 151/102, pulse 60, temperature 97.4 F (36.3 C), temperature source Oral, resp. rate 12, height 6' (1.829 m), weight 85.73 kg (189 lb).Body mass index is 25.63 kg/(m^2).  General Appearance: Casual  Eye Contact::  Fair  Speech:  Normal Rate  Volume:  Normal  Mood:  Depressed  Affect:  Depressed  Thought Process:  Logical  Orientation:  Full (Time, Place, and Person)  Thought Content:  NA  Suicidal Thoughts:  No  Homicidal Thoughts:  No  Memory:  Immediate;   Fair Recent;   Fair Remote;   Fair  Judgement:  Fair  Insight:  Fair  Psychomotor Activity:  Decreased  Concentration:  Fair  Recall:  Fair  Akathisia:  NA  Handed:  Right  AIMS (if indicated):     Assets:  Communication Skills Desire for Improvement  Sleep:  Number of Hours: 6    Current Medications: Current Facility-Administered Medications  Medication Dose Route Frequency Provider Last Rate Last Dose  . acetaminophen (TYLENOL) tablet 650 mg  650 mg Oral Q6H PRN Kerry Hough, PA   650 mg at 07/01/12 1426  . alum & mag hydroxide-simeth (MAALOX/MYLANTA) 200-200-20 MG/5ML  suspension 30 mL  30 mL Oral Q4H PRN Kerry Hough, PA      . benzocaine (ORAJEL) 10 % mucosal gel   Mouth/Throat TID PRN Sanjuana Kava, NP      . carbamazepine (TEGRETOL) tablet 200 mg  200 mg Oral QID Kerry Hough, PA   200 mg at 07/04/12 1201  . cloNIDine (CATAPRES) tablet 0.1 mg  0.1 mg Oral BH-qamhs Kerry Hough, PA   0.1 mg at 07/04/12 1191   Followed by  . cloNIDine (CATAPRES) tablet 0.1 mg  0.1 mg Oral QAC breakfast Kerry Hough, PA      . dicyclomine (BENTYL) tablet 20 mg  20 mg Oral Q6H PRN Kerry Hough, PA      . FLUoxetine (PROZAC) capsule 10 mg  10 mg Oral Daily Himabindu Ravi, MD   10 mg at 07/04/12 1201  . insulin aspart (novoLOG) injection 0-15 Units  0-15 Units Subcutaneous TID WC Kerry Hough, PA   3 Units at 07/04/12 1203  . insulin aspart (novoLOG) injection 0-5 Units  0-5 Units Subcutaneous QHS Kerry Hough, PA   2 Units at 07/03/12 2215  . lisinopril (PRINIVIL,ZESTRIL) tablet 20 mg  20 mg Oral Daily Kerry Hough, PA   20 mg at 07/04/12 0803  . loperamide (IMODIUM) capsule 2-4 mg  2-4 mg Oral PRN Kerry Hough, PA      . magnesium hydroxide (MILK OF  MAGNESIA) suspension 30 mL  30 mL Oral Daily PRN Kerry Hough, PA      . meloxicam [facility]) tablet 7.5 mg  7.5 mg Oral BID Kerry Hough, PA   7.5 mg at 07/04/12 0803  . metFORMIN (GLUCOPHAGE) tablet 500 mg  500 mg Oral BID WC Kerry Hough, PA   500 mg at 07/04/12 0803  . methocarbamol (ROBAXIN) tablet 500 mg  500 mg Oral Q8H PRN Kerry Hough, PA   500 mg at 07/04/12 1610  . nicotine (NICODERM CQ - dosed in mg/24 hours) patch 21 mg  21 mg Transdermal Daily Kerry Hough, PA   21 mg at 07/04/12 0701  . ondansetron (ZOFRAN-ODT) disintegrating tablet 4 mg  4 mg Oral Q6H PRN Kerry Hough, PA      . QUEtiapine (SEROQUEL) tablet 100 mg  100 mg Oral Q2000 Sanjuana Kava, NP   100 mg at 07/03/12 2000    Lab Results:  Results for orders placed during the hospital encounter of 07/01/12 (from the past  48 hour(s))  GLUCOSE, CAPILLARY     Status: Abnormal   Collection Time   07/02/12  5:13 PM      Component Value Range Comment   Glucose-Capillary 251 (*) 70 - 99 mg/dL    Comment 1 Notify RN     GLUCOSE, CAPILLARY     Status: Abnormal   Collection Time   07/02/12  9:25 PM      Component Value Range Comment   Glucose-Capillary 148 (*) 70 - 99 mg/dL    Comment 1 Notify RN      Comment 2 Documented in Chart     GLUCOSE, CAPILLARY     Status: Abnormal   Collection Time   07/03/12  6:12 AM      Component Value Range Comment   Glucose-Capillary 144 (*) 70 - 99 mg/dL   GLUCOSE, CAPILLARY     Status: Abnormal   Collection Time   07/03/12 12:08 PM      Component Value Range Comment   Glucose-Capillary 145 (*) 70 - 99 mg/dL    Comment 1 Documented in Chart      Comment 2 Notify RN     GLUCOSE, CAPILLARY     Status: Abnormal   Collection Time   07/03/12  4:56 PM      Component Value Range Comment   Glucose-Capillary 197 (*) 70 - 99 mg/dL    Comment 1 Notify RN     GLUCOSE, CAPILLARY     Status: Abnormal   Collection Time   07/03/12  9:14 PM      Component Value Range Comment   Glucose-Capillary 205 (*) 70 - 99 mg/dL    Comment 1 Notify RN     GLUCOSE, CAPILLARY     Status: Abnormal   Collection Time   07/04/12  6:14 AM      Component Value Range Comment   Glucose-Capillary 203 (*) 70 - 99 mg/dL    Comment 1 Notify RN     GLUCOSE, CAPILLARY     Status: Abnormal   Collection Time   07/04/12 11:57 AM      Component Value Range Comment   Glucose-Capillary 185 (*) 70 - 99 mg/dL     Physical Findings: AIMS: Facial and Oral Movements Muscles of Facial Expression: None, normal Lips and Perioral Area: None, normal Jaw: None, normal Tongue: None, normal,Extremity Movements Upper (arms, wrists, hands, fingers): None, normal Lower (legs, knees,  ankles, toes): None, normal, Trunk Movements Neck, shoulders, hips: None, normal, Overall Severity Severity of abnormal movements (highest score  from questions above): None, normal Incapacitation due to abnormal movements: None, normal Patient's awareness of abnormal movements (rate only patient's report): No Awareness, Dental Status Current problems with teeth and/or dentures?: No Does patient usually wear dentures?: No  CIWA:  CIWA-Ar Total: 1  COWS:  COWS Total Score: 0   Treatment Plan Summary: Daily contact with patient to assess and evaluate symptoms and progress in treatment Medication management  Plan:    Medical Decision Making Problem Points:  Established problem, worsening (2) Data Points:  Review of medication regiment & side effects (2) Review of new medications or change in dosage (2)  I certify that inpatient services furnished can reasonably be expected to improve the patient's condition.   Nanine Means, PMH-NP 07/04/2012, 1:11 PM

## 2012-07-04 NOTE — Progress Notes (Signed)
D) Pt was physically ill this morning vomiting his breakfast and feeling nauseated much of the morning. Did not attend the groups. Pt was given Zofran at 1000 for his vomiting. Slept all AM. Rates his depression and his hopelessness both at a 2 and denies SI and HI. A) Pt. Given support, reassurance and praise. Given encouragement and diet ginger ale, which Pt said helped a lot.  R) Pt denies SI and HI. Got out of bed some today but continues to feel weaK.

## 2012-07-04 NOTE — Clinical Social Work Note (Signed)
[facility] Group Notes:  (Clinical Social Work)  07/04/2012   3:00-4:00PM  Summary of Progress/Problems:   The main focus of today's process group was for the patient to identify ways in which they have in the past sabotaged their own recovery and reasons they may have done this/what they received from doing it.  We then worked to identify a specific plan to avoid doing this when discharged from the hospital for this admission.  The patient expressed that he keeps self-sabotaging by loaning money to friends when he really cannot afford it.  When CSW pushed him to look at what ways he has self-sabotaged his mental wellness, he stated he tried to commit suicide because everything was coming at him all at once and he "couldn't take it anymore."  He showed some insight about how he self-sabotaged with messages about being worthless.  Type of Therapy:  Group Therapy - Process  Participation Level:  Active  Participation Quality:  Attentive, Sharing and in pain  Affect:  Blunted and Depressed  Cognitive:  Appropriate  Insight:  Engaged  Engagement in Group:  Designer, fashion/clothing in Therapy:  Engaged  Modes of Intervention:  Clarification, Education, Limit-setting, Problem-solving, Socialization, Support and Processing   Ambrose Mantle, LCSW 07/04/2012

## 2012-07-05 LAB — GLUCOSE, CAPILLARY
Glucose-Capillary: 158 mg/dL — ABNORMAL HIGH (ref 70–99)
Glucose-Capillary: 226 mg/dL — ABNORMAL HIGH (ref 70–99)

## 2012-07-05 MED ORDER — PROMETHAZINE HCL 25 MG PO TABS
25.0000 mg | ORAL_TABLET | Freq: Four times a day (QID) | ORAL | Status: DC | PRN
  Administered 2012-07-05: 25 mg via ORAL
  Filled 2012-07-05: qty 1

## 2012-07-05 NOTE — Progress Notes (Signed)
[facility] MD Progress Note  07/05/2012 12:11 PM Matthew [name]  MRN:  [id number] Subjective:  Patient was nauseous and vomiting earlier, states relief after phenergan given Diagnosis:   Axis I: Major Depression, Recurrent severe Axis II: Deferred Axis III:  Past Medical History  Diagnosis Date  . Hypertension   . Diabetes mellitus   . Degenerative disc disease   . Depression     bipolar  . Hepatitis C   . Substance abuse   . DDD (degenerative disc disease), lumbosacral    Axis IV: economic problems, other psychosocial or environmental problems, problems related to social environment and problems with primary support group Axis V: 41-50 serious symptoms  ADL's:  Intact  Sleep: Fair  Appetite:  Fair  Suicidal Ideation:  Denies Homicidal Ideation:  Denies Psychiatric Specialty Exam:  ROS:  Positive for depression and N/V earlier  Blood pressure 151/87, pulse 59, temperature 97.3 F (36.3 C), temperature source Oral, resp. rate 16, height 6' (1.829 m), weight 85.73 kg (189 lb).Body mass index is 25.63 kg/(m^2).  General Appearance: Disheveled  Eye Solicitor::  Fair  Speech:  Normal Rate  Volume:  Normal  Mood:  Depressed  Affect:  Congruent and Depressed  Thought Process:  Coherent  Orientation:  Full (Time, Place, and Person)  Thought Content:  WDL  Suicidal Thoughts:  No  Homicidal Thoughts:  No  Memory:  Immediate;   Fair Recent;   Fair Remote;   Fair  Judgement:  Fair  Insight:  Fair  Psychomotor Activity:  Decreased  Concentration:  Fair  Recall:  Fair  Akathisia:  No  Handed:  Right  AIMS (if indicated):     Assets:  Communication Skills Desire for Improvement Physical Health  Sleep:  Number of Hours: 6.25    Current Medications: Current Facility-Administered Medications  Medication Dose Route Frequency Provider Last Rate Last Dose  . acetaminophen (TYLENOL) tablet 650 mg  650 mg Oral Q6H PRN Kerry Hough, PA   650 mg at 07/01/12 1426  . alum & mag  hydroxide-simeth (MAALOX/MYLANTA) 200-200-20 MG/5ML suspension 30 mL  30 mL Oral Q4H PRN Kerry Hough, PA      . benzocaine (ORAJEL) 10 % mucosal gel   Mouth/Throat TID PRN Sanjuana Kava, NP      . carbamazepine (TEGRETOL) tablet 200 mg  200 mg Oral QID Kerry Hough, PA   200 mg at 07/04/12 2129  . [COMPLETED] cloNIDine (CATAPRES) tablet 0.1 mg  0.1 mg Oral BH-qamhs Kerry Hough, PA   0.1 mg at 07/04/12 2129   Followed by  . cloNIDine (CATAPRES) tablet 0.1 mg  0.1 mg Oral QAC breakfast Kerry Hough, PA      . dicyclomine (BENTYL) tablet 20 mg  20 mg Oral Q6H PRN Kerry Hough, PA      . FLUoxetine (PROZAC) capsule 10 mg  10 mg Oral Daily Himabindu Ravi, MD   10 mg at 07/04/12 1201  . [COMPLETED] gabapentin (NEURONTIN) 300 MG capsule           . gabapentin (NEURONTIN) capsule 300 mg  300 mg Oral TID Nanine Means, NP   300 mg at 07/04/12 1806  . insulin aspart (novoLOG) injection 0-15 Units  0-15 Units Subcutaneous TID WC Kerry Hough, PA   3 Units at 07/05/12 [phone]  . insulin aspart (novoLOG) injection 0-5 Units  0-5 Units Subcutaneous QHS Kerry Hough, PA   2 Units at 07/03/12 2215  . lisinopril (  PRINIVIL,ZESTRIL) tablet 20 mg  20 mg Oral Daily Kerry Hough, PA   20 mg at 07/04/12 1610  . loperamide (IMODIUM) capsule 2-4 mg  2-4 mg Oral PRN Kerry Hough, PA      . magnesium hydroxide (MILK OF MAGNESIA) suspension 30 mL  30 mL Oral Daily PRN Kerry Hough, PA      . meloxicam (MOBIC) tablet 7.5 mg  7.5 mg Oral BID Kerry Hough, PA   7.5 mg at 07/04/12 2001  . metFORMIN (GLUCOPHAGE) tablet 500 mg  500 mg Oral BID WC Kerry Hough, PA   500 mg at 07/04/12 1805  . methocarbamol (ROBAXIN) tablet 500 mg  500 mg Oral Q8H PRN Kerry Hough, PA   500 mg at 07/04/12 9604  . nicotine (NICODERM CQ - dosed in mg/24 hours) patch 21 mg  21 mg Transdermal Daily Kerry Hough, PA   21 mg at 07/04/12 0701  . ondansetron (ZOFRAN-ODT) disintegrating tablet 4 mg  4 mg Oral Q6H PRN  Kerry Hough, PA   4 mg at 07/04/12 1000  . promethazine (PHENERGAN) tablet 25 mg  25 mg Oral Q6H PRN Nanine Means, NP   25 mg at 07/05/12 0904  . QUEtiapine (SEROQUEL) tablet 150 mg  150 mg Oral Q2000 Nanine Means, NP   150 mg at 07/04/12 2001  . [DISCONTINUED] QUEtiapine (SEROQUEL) tablet 100 mg  100 mg Oral Q2000 Sanjuana Kava, NP   100 mg at 07/03/12 2000    Lab Results:  Results for orders placed during the hospital encounter of 07/01/12 (from the past 48 hour(s))  GLUCOSE, CAPILLARY     Status: Abnormal   Collection Time   07/03/12  4:56 PM      Component Value Range Comment   Glucose-Capillary 197 (*) 70 - 99 mg/dL    Comment 1 Notify RN     GLUCOSE, CAPILLARY     Status: Abnormal   Collection Time   07/03/12  9:14 PM      Component Value Range Comment   Glucose-Capillary 205 (*) 70 - 99 mg/dL    Comment 1 Notify RN     GLUCOSE, CAPILLARY     Status: Abnormal   Collection Time   07/04/12  6:14 AM      Component Value Range Comment   Glucose-Capillary 203 (*) 70 - 99 mg/dL    Comment 1 Notify RN     GLUCOSE, CAPILLARY     Status: Abnormal   Collection Time   07/04/12 11:57 AM      Component Value Range Comment   Glucose-Capillary 185 (*) 70 - 99 mg/dL   GLUCOSE, CAPILLARY     Status: Abnormal   Collection Time   07/04/12  5:25 PM      Component Value Range Comment   Glucose-Capillary 158 (*) 70 - 99 mg/dL    Comment 1 Documented in Chart      Comment 2 Notify RN     GLUCOSE, CAPILLARY     Status: Abnormal   Collection Time   07/04/12  9:08 PM      Component Value Range Comment   Glucose-Capillary 172 (*) 70 - 99 mg/dL    Comment 1 Notify RN     GLUCOSE, CAPILLARY     Status: Abnormal   Collection Time   07/05/12  6:11 AM      Component Value Range Comment   Glucose-Capillary 178 (*) 70 - 99 mg/dL  Comment 1 Notify RN       Physical Findings: AIMS: Facial and Oral Movements Muscles of Facial Expression: None, normal Lips and Perioral Area: None, normal Jaw:  None, normal Tongue: None, normal,Extremity Movements Upper (arms, wrists, hands, fingers): None, normal Lower (legs, knees, ankles, toes): None, normal, Trunk Movements Neck, shoulders, hips: None, normal, Overall Severity Severity of abnormal movements (highest score from questions above): None, normal Incapacitation due to abnormal movements: None, normal Patient's awareness of abnormal movements (rate only patient's report): No Awareness, Dental Status Current problems with teeth and/or dentures?: No Does patient usually wear dentures?: No  CIWA:  CIWA-Ar Total: 1  COWS:  COWS Total Score: 0   Treatment Plan Summary: Daily contact with patient to assess and evaluate symptoms and progress in treatment Medication management  Plan:  Patient had N/V earlier--phenergan ordered with relief, placed on enteric precautions at this time, depression 4/10 with depressed affect, concentration fair, stated he slept better last night, poor appetite, denies suicidal/homicidal ideations and hallucinations, close monitoring continues.    Medical Decision Making Problem Points:  Established problem, stable/improving (1) Data Points:  Review of new medications or change in dosage (2)  I certify that inpatient services furnished can reasonably be expected to improve the patient's condition.   Nanine Means, PMH-NP 07/05/2012, 12:11 PM

## 2012-07-05 NOTE — Progress Notes (Signed)
D Pt presented to the medicaiton window at 0745, stating he was " very" nauseated. HE stated he had not been able to eat his breakfast this morning and that he had been " sick all night". This nurse asked him about the night and he clarified, that he vomited 2x and felt like he was going to vomit again. He was assissted to his bed and given a cup of ginger ale. RN spoke with PA (JL) and she prescribed po phenergan and pt was given 25 mg po phenergan for n/v at 0904.   A Pt resting quietly and will monitor pt's status and response to medication.   R POC in place.

## 2012-07-05 NOTE — Progress Notes (Signed)
D: Pt nauseous and vomiting during much of day shift.  Had resolved except for mild nausea by evening shift; Pt declined medication and further ginger ale.  Stated he was feeling better physically and appeared steady on feet with good color.  Pt was friendly and cooperative, attended evening groups and took medications on schedule.  Although both mood and affect anxious, facial expression was animated during conversation with good eye contact.  Pt continues to demonstrate limited insight and impaired judgment when assessing events prior to admission.  Pt needs assistance to initiate ADLs.  Denies SI/HI, AVH, and acute pain except for some toothache.  Contracting for safety on unit.  CBG's: 1725=158 (3units coverage); 2108=172 (no coverage needed at HS).  A: Provided support for Pt venting and helped him to generate ideas to use as coping skills following discharge.  No PRNs given.  Medications provided according to med orders and POC.  Q15 minute checks maintained as per unit protocol.  R: Pt in more positive space but remains anxious/depressed.  Safety maintained. Dion Saucier RN

## 2012-07-05 NOTE — Progress Notes (Signed)
D Pt has slept off and on in his room much of the early afternoon. HE ate a very small amt of lunch..had no complaints of nausea and / or vomiting and then went back to sleep. His affect is cooperative, flat and depressed, but he engages in conversation, he admits that he knows staff is trying to help him and that he " thinks" he might  Be getting better this evening.    A He attends his dinner in the cafe' without difficulty, no c/o nausea and / or vomiting voiced.   R Safety is in place and POC cont.

## 2012-07-05 NOTE — Clinical Social Work Note (Signed)
[facility] Group Notes: (Clinical Social Work)   07/05/2012   3-4pm   Type of Therapy:  Group Therapy   Participation Level:  Did Not Attend    Ambrose Mantle, LCSW 07/05/2012, 5:56 PM

## 2012-07-06 LAB — GLUCOSE, CAPILLARY
Glucose-Capillary: 184 mg/dL — ABNORMAL HIGH (ref 70–99)
Glucose-Capillary: 191 mg/dL — ABNORMAL HIGH (ref 70–99)

## 2012-07-06 MED ORDER — METHOCARBAMOL 500 MG PO TABS
500.0000 mg | ORAL_TABLET | Freq: Three times a day (TID) | ORAL | Status: DC | PRN
  Administered 2012-07-06 (×2): 500 mg via ORAL
  Filled 2012-07-06: qty 1
  Filled 2012-07-06: qty 30
  Filled 2012-07-06: qty 1

## 2012-07-06 NOTE — Progress Notes (Signed)
Psychoeducational Group Note  Date:  07/06/2012 Time: 2000 Group Topic/Focus:  Wrap-Up Group:   The focus of this group is to help patients review their daily goal of treatment and discuss progress on daily workbooks.  Participation Level:  Active  Participation Quality:  Appropriate  Affect:  Appropriate  Cognitive:  Appropriate  Insight:  Engaged  Engagement in Group:  Engaged  Additional Comments:  Pt. attended and participated in group  Gwenevere Ghazi Patience 07/06/2012, 11:33 PM

## 2012-07-06 NOTE — Progress Notes (Signed)
Psychoeducational Group Note  Date:  07/06/2012 Time:  1100  Group Topic/Focus:  Wellness Toolbox:   The focus of this group is to discuss various aspects of wellness, balancing those aspects and exploring ways to increase the ability to experience wellness.  Patients will create a wellness toolbox for use upon discharge.  Participation Level:  Minimal  Participation Quality:  Appropriate and Attentive  Affect:  Appropriate  Cognitive:  Appropriate  Insight:  Engaged  Engagement in Group:  Engaged  Additional Comments:  Pt. Listened attentively to group.   Ruta Hinds [facility] 07/06/2012, 3:15 PM

## 2012-07-06 NOTE — Tx Team (Signed)
Interdisciplinary Treatment Plan Update (Adult)  Date:  07/06/2012  Time Reviewed:  9:51 AM   Progress in Treatment: Attending groups:   Yes   Participating in groups:  Yes Taking medication as prescribed:  Yes Tolerating medication:  Yes Family/Significant othe contact made: Contact made with family Patient understands diagnosis:  Yes Discussing patient identified problems/goals with staff: Yes Medical problems stabilized or resolved: Yes Denies suicidal/homicidal ideation:Yes Issues/concerns per patient self-inventory:  Other:   New problem(s) identified:  Reason for Continuation of Hospitalization:  Interventions implemented related to continuation of hospitalization:  Depression Anxiety  Medication management  Medication Management; safety checks q 15 mins  Additional comments:  Estimated length of stay: 1-2 days  Discharge Plan:  Home with outpatient follow up  New goal(s):  Review of initial/current patient goals per problem list:    1.  Goal(s): Eliminate SI/other thoughts of self harm   Met:  Yes  Target date: d/c  As evidenced by: Patient no longer endorsing SI/HI or other thoughts of self harm.    2.  Goal (s):Reduce depression/anxiety ( rated one/two)  Met: Yes  Target date: d/c  As evidenced by: Patient currently rating symptoms at four or below    3.  Goal(s):.stabilize on meds   Met:  Yes  Target date: d/c  As evidenced by: Patient reports being stabilized on medications - less symptomatic    4.  Goal(s): Refer for outpatient follow up   Met:  Yes  Target date: d/c  As evidenced by: Follow up appointment scheduled    Attendees: Patient:   07/06/2012 9:51 AM  Physican:  Patrick North, MD 07/06/2012 9:51 AM  Nursing:  Neill Loft, RN 07/06/2012 9:51 AM   Nursing:   Quintella Reichert, RN 07/06/2012 9:51 AM   Clinical Social Worker:  Juline Patch, LCSW 07/06/2012 9:51 AM   Other: Serena Colonel, FNP 07/06/2012 9:51 AM   Other:   Patton Salles, Clinical Social Worker, LCSW  07/06/2012 9:51 AM Other:        07/06/2012 9:51 AM

## 2012-07-06 NOTE — Progress Notes (Signed)
Patient ID: Matthew [name], male   DOB: 1975-11-06, 36 y.o.   MRN: [id number] D-Patient reports poor sleep and good appetite.  His energy level is normal and his ability to pay attention is good.  Patient says he feels much better today and is no longer having nausea.  He rate his depression a 2, his anxiety a 2,  and He denies suicdal thoughts. A- supported patient.  R- Patient is anticipating discharge tomorrow
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
## 2012-07-06 NOTE — Progress Notes (Signed)
Patient ID: Matthew [name], male   DOB: [birth year], 36 y.o.   MRN: [id number] D)  Has been out on hall and in dayroom this evening, interacting well with peers and staff.  States is feeling a little better this evening, less nauseated,  Didn't feel like he needed anything else tonight for it. Attended group, held off on taking his pain meds until closer to hs, so far, tolerating well.  Was given novalog 2 units to cover hs CBG of 202.   Contracts for safety. A)  Will continue to monitor q 15 minutes for safety , offer fluids continue POC R)  Safety maintained

## 2012-07-06 NOTE — Progress Notes (Signed)
[facility] MD Progress Note  07/06/2012 2:59 PM Matthew [name]  MRN:  [id number] Subjective:  Patient requesting his medication for back pain at night Diagnosis:   Axis I: Major Depression, Recurrent severe and Substance Abuse Axis II: Deferred Axis III:  Past Medical History  Diagnosis Date  . Hypertension   . Diabetes mellitus   . Degenerative disc disease   . Depression     bipolar  . Hepatitis C   . Substance abuse   . DDD (degenerative disc disease), lumbosacral    Axis IV: economic problems, other psychosocial or environmental problems, problems related to social environment and problems with primary support group Axis V: 41-50 serious symptoms  ADL's:  Intact  Sleep: Fair  Appetite:  Fair  Suicidal Ideation:  Denies Homicidal Ideation:  Denies Psychiatric Specialty Exam: Review of Systems  Constitutional: Negative.   HENT: Negative.   Eyes: Negative.   Respiratory: Negative.   Cardiovascular: Negative.   Gastrointestinal: Negative.   Genitourinary: Negative.   Musculoskeletal: Negative.   Skin: Negative.   Neurological: Negative.   Endo/Heme/Allergies: Negative.   Psychiatric/Behavioral: Positive for depression.  :  Positive for depression 2/10  Blood pressure 148/90, pulse 68, temperature 97.4 F (36.3 C), temperature source Oral, resp. rate 16, height 6' (1.829 m), weight 85.73 kg (189 lb).Body mass index is 25.63 kg/(m^2).  General Appearance: Casual  Eye Contact::  Fair  Speech:  Normal Rate  Volume:  Normal  Mood:  Depressed  Affect:  Congruent  Thought Process:  Coherent and Logical  Orientation:  Full (Time, Place, and Person)  Thought Content:  WDL  Suicidal Thoughts:  No  Homicidal Thoughts:  No  Memory:  Immediate;   Fair Recent;   Fair Remote;   Fair  Judgement:  Fair  Insight:  Fair  Psychomotor Activity:  Decreased  Concentration:  Fair  Recall:  Fair  Akathisia:  No  Handed:  Right  AIMS (if indicated):     Assets:  Communication  Skills Desire for Improvement Social Support  Sleep:  Number of Hours: 4    Current Medications: Current Facility-Administered Medications  Medication Dose Route Frequency Provider Last Rate Last Dose  . acetaminophen (TYLENOL) tablet 650 mg  650 mg Oral Q6H PRN Kerry Hough, PA   650 mg at 07/06/12 1409  . alum & mag hydroxide-simeth (MAALOX/MYLANTA) 200-200-20 MG/5ML suspension 30 mL  30 mL Oral Q4H PRN Kerry Hough, PA      . benzocaine (ORAJEL) 10 % mucosal gel   Mouth/Throat TID PRN Sanjuana Kava, NP      . carbamazepine (TEGRETOL) tablet 200 mg  200 mg Oral QID Kerry Hough, PA   200 mg at 07/06/12 1126  . [COMPLETED] cloNIDine (CATAPRES) tablet 0.1 mg  0.1 mg Oral QAC breakfast Kerry Hough, PA   0.1 mg at 07/06/12 0703  . [EXPIRED] dicyclomine (BENTYL) tablet 20 mg  20 mg Oral Q6H PRN Kerry Hough, PA      . FLUoxetine (PROZAC) capsule 10 mg  10 mg Oral Daily Himabindu Ravi, MD   10 mg at 07/06/12 0853  . gabapentin (NEURONTIN) capsule 300 mg  300 mg Oral TID Nanine Means, NP   300 mg at 07/06/12 1126  . insulin aspart (novoLOG) injection 0-15 Units  0-15 Units Subcutaneous TID WC Kerry Hough, PA   8 Units at 07/06/12 1142  . insulin aspart (novoLOG) injection 0-5 Units  0-5 Units Subcutaneous QHS Kerry Hough,  PA   2 Units at 07/05/12 2215  . lisinopril (PRINIVIL,ZESTRIL) tablet 20 mg  20 mg Oral Daily Kerry Hough, PA   20 mg at 07/06/12 0853  . [EXPIRED] loperamide (IMODIUM) capsule 2-4 mg  2-4 mg Oral PRN Kerry Hough, PA      . magnesium hydroxide (MILK OF MAGNESIA) suspension 30 mL  30 mL Oral Daily PRN Kerry Hough, PA      . meloxicam (MOBIC) tablet 7.5 mg  7.5 mg Oral BID Kerry Hough, PA   7.5 mg at 07/06/12 0853  . metFORMIN (GLUCOPHAGE) tablet 500 mg  500 mg Oral BID WC Kerry Hough, PA   500 mg at 07/06/12 0853  . [EXPIRED] methocarbamol (ROBAXIN) tablet 500 mg  500 mg Oral Q8H PRN Kerry Hough, PA   500 mg at 07/05/12 2216  .  methocarbamol (ROBAXIN) tablet 500 mg  500 mg Oral Q8H PRN Sanjuana Kava, NP   500 mg at 07/06/12 1443  . nicotine (NICODERM CQ - dosed in mg/24 hours) patch 21 mg  21 mg Transdermal Daily Kerry Hough, PA   21 mg at 07/06/12 0706  . [EXPIRED] ondansetron (ZOFRAN-ODT) disintegrating tablet 4 mg  4 mg Oral Q6H PRN Kerry Hough, PA   4 mg at 07/04/12 1000  . promethazine (PHENERGAN) tablet 25 mg  25 mg Oral Q6H PRN Nanine Means, NP   25 mg at 07/05/12 0904  . QUEtiapine (SEROQUEL) tablet 150 mg  150 mg Oral Q2000 Nanine Means, NP   150 mg at 07/05/12 2040    Lab Results:  Results for orders placed during the hospital encounter of 07/01/12 (from the past 48 hour(s))  GLUCOSE, CAPILLARY     Status: Abnormal   Collection Time   07/04/12  5:25 PM      Component Value Range Comment   Glucose-Capillary 158 (*) 70 - 99 mg/dL    Comment 1 Documented in Chart      Comment 2 Notify RN     GLUCOSE, CAPILLARY     Status: Abnormal   Collection Time   07/04/12  9:08 PM      Component Value Range Comment   Glucose-Capillary 172 (*) 70 - 99 mg/dL    Comment 1 Notify RN     GLUCOSE, CAPILLARY     Status: Abnormal   Collection Time   07/05/12  6:11 AM      Component Value Range Comment   Glucose-Capillary 178 (*) 70 - 99 mg/dL    Comment 1 Notify RN     GLUCOSE, CAPILLARY     Status: Abnormal   Collection Time   07/05/12 11:58 AM      Component Value Range Comment   Glucose-Capillary 158 (*) 70 - 99 mg/dL    Comment 1 Documented in Chart      Comment 2 Notify RN     GLUCOSE, CAPILLARY     Status: Abnormal   Collection Time   07/05/12  5:09 PM      Component Value Range Comment   Glucose-Capillary 226 (*) 70 - 99 mg/dL    Comment 1 Documented in Chart      Comment 2 Notify RN     GLUCOSE, CAPILLARY     Status: Abnormal   Collection Time   07/05/12  9:11 PM      Component Value Range Comment   Glucose-Capillary 202 (*) 70 - 99 mg/dL    Comment 1  Notify RN     GLUCOSE, CAPILLARY     Status:  Abnormal   Collection Time   07/06/12  6:10 AM      Component Value Range Comment   Glucose-Capillary 148 (*) 70 - 99 mg/dL   GLUCOSE, CAPILLARY     Status: Abnormal   Collection Time   07/06/12 11:36 AM      Component Value Range Comment   Glucose-Capillary 262 (*) 70 - 99 mg/dL     Physical Findings: AIMS: Facial and Oral Movements Muscles of Facial Expression: None, normal Lips and Perioral Area: None, normal Jaw: None, normal Tongue: None, normal,Extremity Movements Upper (arms, wrists, hands, fingers): None, normal Lower (legs, knees, ankles, toes): None, normal, Trunk Movements Neck, shoulders, hips: None, normal, Overall Severity Severity of abnormal movements (highest score from questions above): None, normal Incapacitation due to abnormal movements: None, normal Patient's awareness of abnormal movements (rate only patient's report): No Awareness, Dental Status Current problems with teeth and/or dentures?: No Does patient usually wear dentures?: No  CIWA:  CIWA-Ar Total: 0  COWS:  COWS Total Score: 0   Treatment Plan Summary: Daily contact with patient to assess and evaluate symptoms and progress in treatment Medication management  Plan:  Medication management for depression and sleep, 2/10 depression with congruent affect, fair concentration, denies suicidal/homicidal ideations and hallucinations, patient states he no longer has N/V/D, requested his robaxin be given at night--reminded him it is PRN and he can ask for it but it has to be spaced appropriately, Nolyn states he is glad he did not discharge today because he wants to learn more about treatment for PTSD  Medical Decision Making Problem Points:  Established problem, stable/improving (1) Data Points:  Review of medication regiment & side effects (2)  I certify that inpatient services furnished can reasonably be expected to improve the patient's condition.   Nanine Means, PMH-NP 07/06/2012, 2:59 PM

## 2012-07-06 NOTE — Progress Notes (Signed)
[facility] LCSW Aftercare Discharge Planning Group Note  07/06/2012  8:30  Participation Quality:  Appropriate  Affect:  Appropriate  Cognitive:  Alert and Appropriate  Insight:  Engaged  Engagement in Group:  Engaged  Modes of Intervention:  Education, Geophysicist/field seismologist of Progress/Problems:  Patient reports being much better and denies SI/HI.  He rates all symptoms at two  [name] 07/06/2012, 4:20 PM

## 2012-07-07 LAB — GLUCOSE, CAPILLARY: Glucose-Capillary: 208 mg/dL — ABNORMAL HIGH (ref 70–99)

## 2012-07-07 MED ORDER — GABAPENTIN 300 MG PO CAPS: 300.0000 mg | ORAL_CAPSULE | Freq: Three times a day (TID) | ORAL | Status: DC

## 2012-07-07 MED ORDER — QUETIAPINE FUMARATE 200 MG PO TABS
200.0000 mg | ORAL_TABLET | Freq: Every day | ORAL | Status: DC
  Filled 2012-07-07: qty 1

## 2012-07-07 MED ORDER — INSULIN ASPART 100 UNIT/ML ~~LOC~~ SOLN: 0.0000 [IU] | Freq: Three times a day (TID) | SUBCUTANEOUS | Status: DC

## 2012-07-07 MED ORDER — INSULIN ASPART 100 UNIT/ML ~~LOC~~ SOLN: 0.0000 [IU] | Freq: Every day | SUBCUTANEOUS | Status: DC

## 2012-07-07 MED ORDER — FLUOXETINE HCL 10 MG PO CAPS: 10.0000 mg | ORAL_CAPSULE | Freq: Every day | ORAL | Status: DC

## 2012-07-07 MED ORDER — METHOCARBAMOL 500 MG PO TABS: 500.0000 mg | ORAL_TABLET | Freq: Three times a day (TID) | ORAL | Status: DC | PRN

## 2012-07-07 NOTE — Progress Notes (Signed)
D: Patient resting in bed with eyes closed.  Respirations even and unlabored.  Patient appears to be in no apparent distress. A: Staff to monitor Q 15 mins for safety.   R:Patient remains safe on the unit.  

## 2012-07-07 NOTE — Progress Notes (Signed)
Reviewed, agree with assessment and recommendations. 

## 2012-07-07 NOTE — Progress Notes (Signed)
[facility] Adult Case Management Discharge Plan :  Will you be returning to the same living situation after discharge: Yes,  Patient returning to his home At discharge, do you have transportation home?:No.  Patient may need assistance with transportation Do you have the ability to pay for your medications:Yes,  Patient has Medicaid  Release of information consent forms completed and in the chart;  Patient's signature needed at discharge.  Patient to Follow up at: Follow-up Information    Follow up with [facility]. On 07/10/2012. (You are scheduled with [facility] on Friday, December 13, 9:30.  Please ask for [facility])    Contact information:   [address] [place], [place]  [address]  [phone]           Patient denies SI/HI:   Yes,  Patient not endorsing SI/HI or thoughts of self harm    Safety Planning and Suicide Prevention discussed:  Yes,  Reiewed during groups  Eugenia Eldredge Hairston 07/07/2012, 12:49 PM

## 2012-07-07 NOTE — Progress Notes (Signed)
Grief Group  The group focused on the various ways in which grief around loss is experienced and the impact of certain losses on identity. There was discussion of what happens when grief is not embraced but bottled up or avoided. Group members were actively engaged with each other in supporting each other. Pt was active in group.  Marl Seago E Chaplain  

## 2012-07-07 NOTE — Progress Notes (Signed)
Patient ID: Matthew [name], male   DOB: August 20, 1975, 36 y.o.   MRN: [id number] D-Patient was discharged ambulatory to ride home with peer and her husband.  He denies SI/HI.  He verbalizes understanding of his discharge meds and followup. Suicide prevention information was reviewed.  He was given scripts and  meds by MD.  His belongings were returned from locker.  He verbalizes appreciation for care given

## 2012-07-07 NOTE — Clinical Social Work Note (Signed)
Writer spoke with Ruby at Dr. Tilman Neat office.  Ruby advised patient has no showed for two appointments and they will be unable to provide an appointment.  Ruby advised we are trying to get patient referred for an ACT Team but it could take a few weeks for service to start.  Writer reminded Ruby of conversation last week asking MD for his recommendation regarding ACT Team.  Patient scheduled for [facility]iaison for an assessment with ACT Team on Friday, July 10, 2012 at 9:30 AM.  Liaison advised the appointment would not cover medication management if patient not accepted for services.  Referral also made to [facility] for medication management.

## 2012-07-07 NOTE — Discharge Summary (Signed)
Physician Discharge Summary Note  Patient:  Matthew [name] is an 36 y.o., male MRN:  [id number] DOB:  [birth year] Patient phone:  [phone] (home)  Patient address:   [address] [place] [address],   Date of Admission:  07/01/2012 Date of Discharge: 07/07/2012  Reason for Admission:  Patient became severely depressed  Discharge Diagnoses: Active Problems:  * No active hospital problems. *   Review of Systems  Constitutional: Negative.   HENT: Negative.   Eyes: Negative.   Respiratory: Negative.   Gastrointestinal: Negative.   Genitourinary: Negative.   Musculoskeletal: Negative.   Skin: Negative.   Neurological: Negative.   Endo/Heme/Allergies: Negative.   Psychiatric/Behavioral: Positive for depression.   Axis Diagnosis:   AXIS I:  Anxiety Disorder NOS and Major Depression, Recurrent severe AXIS II:  Deferred AXIS III:   Past Medical History  Diagnosis Date  . Hypertension   . Diabetes mellitus   . Degenerative disc disease   . Depression     bipolar  . Hepatitis C   . Substance abuse   . DDD (degenerative disc disease), lumbosacral    AXIS IV:  other psychosocial or environmental problems, problems related to social environment and problems with primary support group AXIS V:  61-70 mild symptoms  Level of Care:  OP  Hospital Course:  Individual and group therapy, medication management for depression and sleep  Consults:  None  Significant Diagnostic Studies:  labs: Completed and reviewed, stable  Discharge Vitals:   Blood pressure 136/92, pulse 67, temperature 98 F (36.7 C), temperature source Oral, resp. rate 20, height 6' (1.829 m), weight 85.73 kg (189 lb). Body mass index is 25.63 kg/(m^2). Lab Results:   Results for orders placed during the hospital encounter of 07/01/12 (from the past 72 hour(s))  GLUCOSE, CAPILLARY     Status: Abnormal   Collection Time   07/04/12 11:57 AM      Component Value Range Comment   Glucose-Capillary 185  (*) 70 - 99 mg/dL   GLUCOSE, CAPILLARY     Status: Abnormal   Collection Time   07/04/12  5:25 PM      Component Value Range Comment   Glucose-Capillary 158 (*) 70 - 99 mg/dL    Comment 1 Documented in Chart      Comment 2 Notify RN     GLUCOSE, CAPILLARY     Status: Abnormal   Collection Time   07/04/12  9:08 PM      Component Value Range Comment   Glucose-Capillary 172 (*) 70 - 99 mg/dL    Comment 1 Notify RN     GLUCOSE, CAPILLARY     Status: Abnormal   Collection Time   07/05/12  6:11 AM      Component Value Range Comment   Glucose-Capillary 178 (*) 70 - 99 mg/dL    Comment 1 Notify RN     GLUCOSE, CAPILLARY     Status: Abnormal   Collection Time   07/05/12 11:58 AM      Component Value Range Comment   Glucose-Capillary 158 (*) 70 - 99 mg/dL    Comment 1 Documented in Chart      Comment 2 Notify RN     GLUCOSE, CAPILLARY     Status: Abnormal   Collection Time   07/05/12  5:09 PM      Component Value Range Comment   Glucose-Capillary 226 (*) 70 - 99 mg/dL    Comment 1 Documented in Chart  Comment 2 Notify RN     GLUCOSE, CAPILLARY     Status: Abnormal   Collection Time   07/05/12  9:11 PM      Component Value Range Comment   Glucose-Capillary 202 (*) 70 - 99 mg/dL    Comment 1 Notify RN     GLUCOSE, CAPILLARY     Status: Abnormal   Collection Time   07/06/12  6:10 AM      Component Value Range Comment   Glucose-Capillary 148 (*) 70 - 99 mg/dL   GLUCOSE, CAPILLARY     Status: Abnormal   Collection Time   07/06/12 11:36 AM      Component Value Range Comment   Glucose-Capillary 262 (*) 70 - 99 mg/dL   GLUCOSE, CAPILLARY     Status: Abnormal   Collection Time   07/06/12  4:44 PM      Component Value Range Comment   Glucose-Capillary 184 (*) 70 - 99 mg/dL   GLUCOSE, CAPILLARY     Status: Abnormal   Collection Time   07/06/12  8:46 PM      Component Value Range Comment   Glucose-Capillary 191 (*) 70 - 99 mg/dL   GLUCOSE, CAPILLARY     Status: Abnormal   Collection  Time   07/07/12  6:25 AM      Component Value Range Comment   Glucose-Capillary 194 (*) 70 - 99 mg/dL    Comment 1 Notify RN       Physical Findings: AIMS: Facial and Oral Movements Muscles of Facial Expression: None, normal Lips and Perioral Area: None, normal Jaw: None, normal Tongue: None, normal,Extremity Movements Upper (arms, wrists, hands, fingers): None, normal Lower (legs, knees, ankles, toes): None, normal, Trunk Movements Neck, shoulders, hips: None, normal, Overall Severity Severity of abnormal movements (highest score from questions above): None, normal Incapacitation due to abnormal movements: None, normal Patient's awareness of abnormal movements (rate only patient's report): No Awareness, Dental Status Current problems with teeth and/or dentures?: No Does patient usually wear dentures?: No  CIWA:  CIWA-Ar Total: 0  COWS:  COWS Total Score: 0   Psychiatric Specialty Exam: See Psychiatric Specialty Exam and Suicide Risk Assessment completed by Attending Physician prior to discharge.  Discharge destination:  Home  Is patient on multiple antipsychotic therapies at discharge:  No   Has Patient had three or more failed trials of antipsychotic monotherapy by history:  No Recommended Plan for Multiple Antipsychotic Therapies:  N/A  Discharge Orders    Future Orders Please Complete By Expires   Diet - low sodium heart healthy      Activity as tolerated - No restrictions          Medication List     As of 07/07/2012 11:35 AM    STOP taking these medications         ALPRAZolam 1 MG tablet   Commonly known as: XANAX      amitriptyline 50 MG tablet   Commonly known as: ELAVIL      fentaNYL 25 MCG/HR   Commonly known as: DURAGESIC - dosed mcg/hr      TAKE these medications      Indication    acetaminophen 325 MG tablet   Commonly known as: TYLENOL   Take 2 tablets (650 mg total) by mouth every 6 (six) hours as needed (or Fever >/= 101).        carbamazepine 200 MG tablet   Commonly known as: TEGRETOL   Take 1 tablet (  200 mg total) by mouth 4 (four) times daily. For management of anxiety and mood       FLUoxetine 10 MG capsule   Commonly known as: PROZAC   Take 1 capsule (10 mg total) by mouth daily.    Indication: depression      gabapentin 300 MG capsule   Commonly known as: NEURONTIN   Take 1 capsule (300 mg total) by mouth 3 (three) times daily.    Indication: Agitation      insulin aspart 100 UNIT/ML injection   Commonly known as: novoLOG   Inject 0-15 Units into the skin 3 (three) times daily with meals.    Indication: Insulin-Dependent Diabetes      insulin aspart 100 UNIT/ML injection   Commonly known as: novoLOG   Inject 0-5 Units into the skin at bedtime.    Indication: Insulin-Dependent Diabetes      lisinopril 20 MG tablet   Commonly known as: PRINIVIL,ZESTRIL   Take 1 tablet (20 mg total) by mouth daily. For control of high blood pressure       meloxicam 7.5 MG tablet   Commonly known as: MOBIC   Take 1 tablet (7.5 mg total) by mouth 2 (two) times daily. For inflammation       metFORMIN 500 MG tablet   Commonly known as: GLUCOPHAGE   Take 1 tablet (500 mg total) by mouth 2 (two) times daily with a meal. For control of blood sugar    Indication: Type 2 Diabetes      methocarbamol 500 MG tablet   Commonly known as: ROBAXIN   Take 1 tablet (500 mg total) by mouth every 8 (eight) hours as needed.    Indication: Musculoskeletal Pain      nicotine 21 mg/24hr patch   Commonly known as: NICODERM CQ - dosed in mg/24 hours   Place 1 patch onto the skin daily. For smoking cessation.       QUEtiapine 200 MG tablet   Commonly known as: SEROQUEL   Take 1 tablet (200 mg total) by mouth at bedtime. TAKE at SIX PM for insomnia.  It works for you at that time of day          Follow-up recommendations:  Activity as tolerated, low-sodium heart healthy and low carb diet  Comments:  Patient stated his  depression 2/10, affect improved, concentration improved, sleep and appetite good, Jaiven will follow-up with out-patient and has been connected to his ACT team, Rx given, denies suicidal/homicidal ideations and hallucinations  Total Discharge Time:  Greater than 30  min  Signed: Nanine Means, PMH-NP 07/07/2012, 11:35 AM

## 2012-07-07 NOTE — Progress Notes (Signed)
[facility] LCSW Aftercare Discharge Planning Group Note  07/07/2012 11:49 AM  Participation Quality:  Appropriate  Affect:  Appropriate  Cognitive:  Appropriate  Insight:  Engaged  Engagement in Group:  Engaged  Modes of Intervention:  Exploration, Problem-solving and Support  Summary of Progress/Problems: Patient seen during discharge planning group.  He reports being much improved and denies SI/HI.  He is rating all symptoms at one.  Patient states he is looking forward to discharging home today but may need assistance with transportation.  [name] 07/07/2012, 11:49 AM

## 2012-07-07 NOTE — [facility] Suicide Risk Assessment (Signed)
Suicide Risk Assessment  Discharge Assessment     Demographic Factors:  Male, Caucasian, Low socioeconomic status and Unemployed  Mental Status Per Nursing Assessment::   On Admission:  Suicidal ideation indicated by patient  Current Mental Status by Physician: Patient alert and oriented to 4. Denies AH/VH/SI/HI.  Loss Factors: Financial problems/change in socioeconomic status  Historical Factors: Family history of mental illness or substance abuse and Impulsivity  Risk Reduction Factors:   Positive coping skills or problem solving skills  Continued Clinical Symptoms:  Depression:   Recent sense of peace/wellbeing  Cognitive Features That Contribute To Risk:  Cognitively intact    Suicide Risk:  Minimal: No identifiable suicidal ideation.  Patients presenting with no risk factors but with morbid ruminations; may be classified as minimal risk based on the severity of the depressive symptoms  Discharge Diagnoses:   AXIS I:  Alcohol Abuse and Depressive Disorder NOS, Cocaine abuse AXIS II:  No diagnosis AXIS III:   Past Medical History  Diagnosis Date  . Hypertension   . Diabetes mellitus   . Degenerative disc disease   . Depression     bipolar  . Hepatitis C   . Substance abuse   . DDD (degenerative disc disease), lumbosacral    AXIS IV:  economic problems and occupational problems AXIS V:  61-70 mild symptoms  Plan Of Care/Follow-up recommendations:  Activity:  normal Diet:  normal  Is patient on multiple antipsychotic therapies at discharge:  No   Has Patient had three or more failed trials of antipsychotic monotherapy by history:  No  Recommended Plan for Multiple Antipsychotic Therapies: NA  Matthew [name] 07/07/2012, 10:17 AM

## 2012-07-08 NOTE — Discharge Summary (Signed)
Reviewed

## 2012-07-09 NOTE — Progress Notes (Signed)
Patient Discharge Instructions:  After Visit Summary (AVS):   Faxed to:  07/09/12 Psychiatric Admission Assessment Note:   Faxed to:  07/09/12 Suicide Risk Assessment - Discharge Assessment:   Faxed to:  07/09/12 Faxed/Sent to the Next Level Care provider:  07/09/12 Faxed to [facility] @ [phone]  Jerelene Redden, 07/09/2012, 3:41 PM

## 2013-11-17 IMAGING — CR DG CHEST 1V PORT
1 series · 1 of 1 positions shown · non-contrast
Comparison: Yesterday

CLINICAL DATA: Respiratory failure

PORTABLE CHEST - 1 VIEW

[view not recorded]
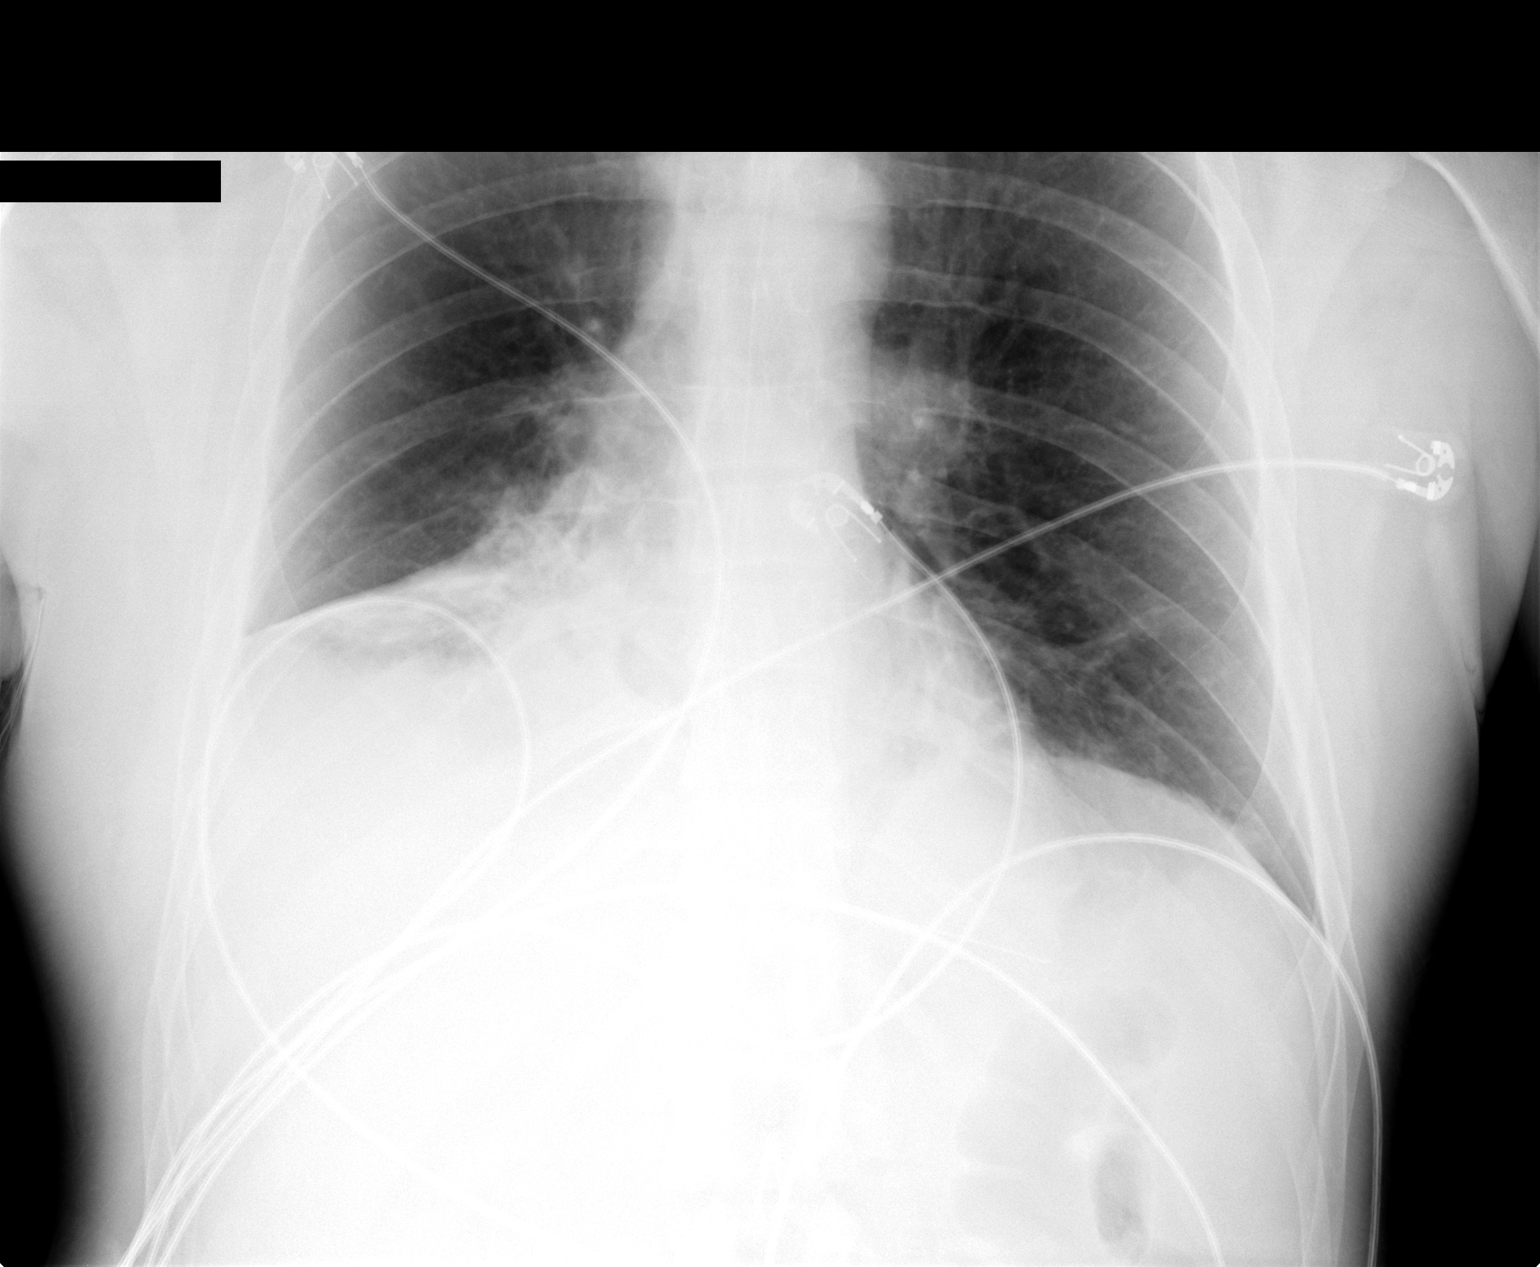

[1 of 1 positions shown; findings below may reference images not displayed]

FINDINGS: Stable endotracheal tube.  NG tube placed with its tip in
the fundus of the stomach.  Mild left lung base atelectasis is
stable.  Dense consolidation and/or collapse has occurred at the
right base involving right lower and middle lobes.  Right upper
lobe remains aerated and clear.  Lung apices not included limiting
the study.
IMPRESSION: Stable left base atelectasis

New right middle and lower lobe consolidation verse collapse.

NG tube placed into the fundus of the stomach.

## 2014-05-09 ENCOUNTER — Encounter (HOSPITAL_COMMUNITY): Payer: Self-pay | Admitting: Emergency Medicine

## 2014-05-09 ENCOUNTER — Emergency Department (HOSPITAL_COMMUNITY)
Admission: EM | Admit: 2014-05-09 | Discharge: 2014-05-10 | Disposition: A | Discharge status: Home / Self Care | Payer: Medicaid Other | Attending: Emergency Medicine | Admitting: Emergency Medicine

## 2014-05-09 DIAGNOSIS — Z8619 Personal history of other infectious and parasitic diseases: Secondary | ICD-10-CM | POA: Insufficient documentation

## 2014-05-09 DIAGNOSIS — R739 Hyperglycemia, unspecified: Secondary | ICD-10-CM

## 2014-05-09 DIAGNOSIS — I10 Essential (primary) hypertension: Secondary | ICD-10-CM | POA: Insufficient documentation

## 2014-05-09 DIAGNOSIS — F329 Major depressive disorder, single episode, unspecified: Secondary | ICD-10-CM | POA: Insufficient documentation

## 2014-05-09 DIAGNOSIS — M5430 Sciatica, unspecified side: Secondary | ICD-10-CM | POA: Insufficient documentation

## 2014-05-09 DIAGNOSIS — R631 Polydipsia: Secondary | ICD-10-CM | POA: Insufficient documentation

## 2014-05-09 DIAGNOSIS — M5441 Lumbago with sciatica, right side: Secondary | ICD-10-CM

## 2014-05-09 DIAGNOSIS — Z720 Tobacco use: Secondary | ICD-10-CM | POA: Insufficient documentation

## 2014-05-09 DIAGNOSIS — Z79899 Other long term (current) drug therapy: Secondary | ICD-10-CM | POA: Insufficient documentation

## 2014-05-09 DIAGNOSIS — E1165 Type 2 diabetes mellitus with hyperglycemia: Secondary | ICD-10-CM | POA: Insufficient documentation

## 2014-05-09 LAB — URINE MICROSCOPIC-ADD ON

## 2014-05-09 LAB — URINALYSIS, ROUTINE W REFLEX MICROSCOPIC
BILIRUBIN URINE: NEGATIVE
Glucose, UA: >1000 mg/dL — AB
Hgb urine dipstick: NEGATIVE
KETONES UR: NEGATIVE mg/dL
LEUKOCYTES UA: NEGATIVE
NITRITE: NEGATIVE
PH: 5.5 (ref 5.0–8.0)
PROTEIN: NEGATIVE mg/dL
Specific Gravity, Urine: >1.046 — ABNORMAL HIGH (ref 1.005–1.030)
UROBILINOGEN UA: 0.2 mg/dL (ref 0.0–1.0)

## 2014-05-09 LAB — COMPREHENSIVE METABOLIC PANEL
ALBUMIN: 4.5 g/dL (ref 3.5–5.2)
ALT: 48 U/L (ref 0–53)
ANION GAP: 14 (ref 5–15)
AST: 23 U/L (ref 0–37)
Alkaline Phosphatase: 87 U/L (ref 39–117)
BILIRUBIN TOTAL: 0.2 mg/dL — AB (ref 0.3–1.2)
BUN: 16 mg/dL (ref 6–23)
CALCIUM: 9.8 mg/dL (ref 8.4–10.5)
CHLORIDE: 95 meq/L — AB (ref 96–112)
CO2: 27 mEq/L (ref 19–32)
CREATININE: 0.58 mg/dL (ref 0.50–1.35)
GFR calc Af Amer: >90 mL/min (ref >90)
GFR calc non Af Amer: >90 mL/min (ref >90)
Glucose, Bld: 395 mg/dL — ABNORMAL HIGH (ref 70–99)
Potassium: 4.3 mEq/L (ref 3.7–5.3)
Sodium: 136 mEq/L — ABNORMAL LOW (ref 137–147)
TOTAL PROTEIN: 8.7 g/dL — AB (ref 6.0–8.3)

## 2014-05-09 LAB — CBC
HEMATOCRIT: 41.7 % (ref 39.0–52.0)
HEMOGLOBIN: 15.2 g/dL (ref 13.0–17.0)
MCH: 30.4 pg (ref 26.0–34.0)
MCHC: 36.5 g/dL — ABNORMAL HIGH (ref 30.0–36.0)
MCV: 83.4 fL (ref 78.0–100.0)
Platelets: 161 10*3/uL (ref 150–400)
RBC: 5 MIL/uL (ref 4.22–5.81)
RDW: 13.9 % (ref 11.5–15.5)
WBC: 8.8 10*3/uL (ref 4.0–10.5)

## 2014-05-09 LAB — CBG MONITORING, ED
GLUCOSE-CAPILLARY: 383 mg/dL — AB (ref 70–99)
GLUCOSE-CAPILLARY: 252 mg/dL — AB (ref 70–99)

## 2014-05-09 MED ORDER — OXYCODONE-ACETAMINOPHEN 5-325 MG PO TABS
1.0000 | ORAL_TABLET | Freq: Once | ORAL | Status: AC
  Administered 2014-05-09: 1 via ORAL
  Filled 2014-05-09: qty 1

## 2014-05-09 MED ORDER — KETOROLAC TROMETHAMINE 30 MG/ML IJ SOLN
30.0000 mg | Freq: Once | INTRAMUSCULAR | Status: AC
  Administered 2014-05-09: 30 mg via INTRAVENOUS
  Filled 2014-05-09: qty 1

## 2014-05-09 MED ORDER — HYDROCODONE-ACETAMINOPHEN 5-325 MG PO TABS: 1.0000 | ORAL_TABLET | Freq: Four times a day (QID) | ORAL | Status: DC | PRN

## 2014-05-09 MED ORDER — SODIUM CHLORIDE 0.9 % IV BOLUS (SEPSIS)
1000.0000 mL | Freq: Once | INTRAVENOUS | Status: AC
  Administered 2014-05-09: 1000 mL via INTRAVENOUS

## 2014-05-09 MED ORDER — MORPHINE SULFATE 4 MG/ML IJ SOLN
4.0000 mg | Freq: Once | INTRAMUSCULAR | Status: AC
  Administered 2014-05-09: 4 mg via INTRAVENOUS
  Filled 2014-05-09: qty 1

## 2014-05-09 MED ORDER — IBUPROFEN 800 MG PO TABS: 800.0000 mg | ORAL_TABLET | Freq: Three times a day (TID) | ORAL | Status: DC

## 2014-05-09 NOTE — ED Notes (Signed)
PT monitored by pulse ox, bp cuff, and 5-lead. 
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
## 2014-05-09 NOTE — ED Notes (Signed)
Back hurting from bending over this am.  Heard a pop. Also, cbg > 400-500.  Took the flex pen (twice the dose) to bring sugar down.  Took 1000 mg of mertformin.

## 2014-05-09 NOTE — ED Notes (Signed)
Lauren, PA-C at the bedside.

## 2014-05-09 NOTE — ED Notes (Signed)
CBG = 252. Nurse informed.

## 2014-05-10 LAB — CBG MONITORING, ED: GLUCOSE-CAPILLARY: 216 mg/dL — AB (ref 70–99)

## 2014-05-10 MED ORDER — CYCLOBENZAPRINE HCL 10 MG PO TABS: 10.0000 mg | ORAL_TABLET | Freq: Every day | ORAL | Status: DC

## 2014-05-10 NOTE — Discharge Instructions (Signed)
Call for a follow up appointment with a Family or Primary Care Provider.  Return if Symptoms worsen.   Take medication as prescribed.  Do not operate heavy machinery or drink alcohol while taking narcotic and muscle relaxant.   Emergency Department Resource Guide 1) Find a Doctor and Pay Out of Pocket Although you won't have to find out who is covered by your insurance plan, it is a good idea to ask around and get recommendations. You will then need to call the office and see if the doctor you have chosen will accept you as a new patient and what types of options they offer for patients who are self-pay. Some doctors offer discounts or will set up payment plans for their patients who do not have insurance, but you will need to ask so you aren't surprised when you get to your appointment.  2) Contact Your Local Health Department Not all health departments have doctors that can see patients for sick visits, but many do, so it is worth a call to see if yours does. If you don't know where your local health department is, you can check in your phone book. The CDC also has a tool to help you locate your state's health department, and many state websites also have listings of all of their local health departments.  3) Find a Walk-in Clinic If your illness is not likely to be very severe or complicated, you may want to try a walk in clinic. These are popping up all over the country in pharmacies, drugstores, and shopping centers. They're usually staffed by nurse practitioners or physician assistants that have been trained to treat common illnesses and complaints. They're usually fairly quick and inexpensive. However, if you have serious medical issues or chronic medical problems, these are probably not your best option.  No Primary Care Doctor: - Call Health Connect at  [phone] - they can help you locate a primary care doctor that  accepts your insurance, provides certain services, etc. - Physician  Referral Service- [phone]  Chronic Pain Problems: Organization         Address  Phone   Notes  [facility]  [phone] Patients need to be referred by their primary care doctor.   Medication Assistance: Organization         Address  Phone   Notes  [facility] [address] [place], [place] [address] [phone] --Must be a resident of [place] -- Must have NO insurance coverage whatsoever (no Medicaid/ Medicare, etc.) -- The pt. MUST have a primary care doctor that directs their care regularly and follows them in the community   MedAssist  [phone]   [employer]  [phone]    Agencies that provide inexpensive medical care: Organization         Address  Phone   Notes  [facility]  [phone]   [facility]    [phone]   [facility] [address] [place], [place] [address] [phone]   [facility] [address], [place] [phone]   [facility]anned Parenthood    [phone]   [facility]    [phone]   [facility]  [address],  Phone:  [phone], Fax:  [phone] Hours of Operation:  9 am - 6 pm, M-F.  Also accepts Medicaid/Medicare and self-pay.  [facility] for [place], [address], [place] Phone: [phone], Fax: [phone]. Hours of Operation:  8:30 am - 5:30 pm, M-F.  Also accepts Medicaid and self-pay.  [facility] [address] [place] Phone: [phone]   [place], [place], [place] [phone], Ext. [phone] Mondays & Thursdays: 7-9 AM.  First 15 patients are seen on a first come, first serve basis.    [place] Providers:  Organization         Address  Phone   Notes  [facility] [address], [place] [phone] Also accepts self-pay patients.  [facility] [address], [place]  [phone]   [place], [address], [place] [phone]   [facility] [address], [place] [phone]   Lucianne Lei [address], [place]   [phone] Only accepts [place] Access [place] patients after they have their name applied to their card.   Self-Pay (no insurance) in [facility]:  Organization         Address  Phone   Notes  Sickle Cell Patients, [facility] [place] [phone]   [facility] [place] [phone]   [facility] [place]  [place], [place], [place] [phone]   [facility]/Dr. Osei-Bonsu  [address] [place] or [address], [place] [phone] Phone number for both [place] and [place] locations is the same.  [facility]gent [facility] and [facility] [address] [place] [phone]   [facility] [address] [place] or [address] [phone]   [facility] [address] [place] [phone], phone; [phone], fax Sees patients 1st and 3rd Saturday of every month.  Must not qualify for public or private insurance (i.e. Medicaid, Medicare, [place] Health Choice, Veterans' Benefits)  Household income should be no more than 200% of the poverty level The clinic cannot treat you if you are pregnant or think you are pregnant  Sexually transmitted diseases are not treated at the clinic.    Dental Care: Organization         Address  Phone  Notes  [facility] [place] [phone] Accepts children up to age 84 who are enrolled  in [place] or [place]; pregnant women with a Medicaid card; and children who have applied for Medicaid or [place] Health Choice, but were declined, whose parents can pay a reduced fee at time of service.  [facility]  [address], [place] [phone] Accepts children up to age 33 who are enrolled in [place] or [place]; pregnant women with a Medicaid card; and children who have applied for Medicaid or [place] Health Choice, but were declined, whose parents can pay a reduced fee at time of service.  [facility] PRO[facility]  [place] [phone] Patients are seen by appointment only. Walk-ins are not accepted. [place] will see patients 49 years of age and older. Monday - Tuesday (8am-5pm) Most Wednesdays (8:30-5pm) $30 per  visit, cash only  [facility] Adult [employer] PROGRAM  [address], [facility] [phone] Patients are seen by appointment only. Walk-ins are not accepted. [place] will see patients 75 years of age and older. One Wednesday Evening (Monthly: Volunteer Based).  $30 per visit, cash only  [place]  [phone] for adults; Children under age 64, call Graduate Pediatric Dentistry at [phone]. Children aged 33-14, please call [phone] to request a pediatric application.  Dental services are provided in all areas of dental care including fillings, crowns and bridges, complete and partial dentures, implants, gum treatment, root canals, and extractions. Preventive care is also provided. Treatment is provided to both adults and children. Patients are selected via a lottery and there is often a waiting list.   [facility] [address], [place]  [phone] [email or web address]   [facility] [address] [place], [place] [phone], Ext. [phone] Second and Fourth Thursday of each month, opens at  6:30 AM; Clinic ends at 9 AM.  Patients are seen on a first-come first-served basis, and a limited number are seen during each clinic.   [facility]  [address] Hillard Danker [place], [place] [phone]   Eligibility Requirements You must have lived in [place], [place], or [place] counties for at least the last three months.   You cannot be eligible for state or federal sponsored [employer], including [employer], [place], or [employer].   You generally cannot be eligible for healthcare insurance through your employer.    How to apply: Eligibility screenings are held every Tuesday and Wednesday afternoon from 1:00 pm until 4:00 pm. You do not need an appointment for the interview!  [facility] [address], [place], [place]   [place]  [place] Department  [place]  [phone]    Behavioral Health Resources in the Community: Intensive Outpatient Programs Organization         Address  Phone  Notes  [place]. [address] [place], [place] [phone]   [facility]patient [address], [place], [place]   ADS: Alcohol & Drug Svcs [address], [place], [place]   [place] [address]  [place], [place] or [phone]   Substance Abuse Resources Organization         Address  Phone  Notes  Alcohol [facility] Drug Services  [phone]   [place]  [phone]   The [place]   Chinita Pester  [phone]   Residential & Outpatient Substance Abuse Program  [phone]   Psychological Services Organization         Address  Phone  Notes  [facility]  [place]  [phone]   [place] [address], [place] or [phone]    Mobile Crisis Teams Organization         Address  Phone  Notes  [facility], Mobile Crisis Care Unit  [phone]   [facility]  [address] [place], [place]   [place] Levels [address] [place] [phone]    Self-Help/Support Groups Organization         Address  Phone  Notes  [facility]ental [facility]oc[facility] of [place] - variety of support groups  [place] Call for more information  Narcotics Anonymous (NA), [facility]aring [facility] [address], [employer] [place]  2 meetings at this location   Special educational needs teacher         Address  Phone  Notes  ASAP Residential Treatment [place],    [place]  [phone]   [facility]  [address] [place] [id number], [place], [place]   [place], [place] [phone] Admissions: 8am-3pm M-F  Incentives Substance [place] [address],    [place], [place] [phone]   [facility] [address] [place], [place], [place]   [facility] [address]  [place], [place]   [facility] - Intensive Outpatient [address] [place]een [place] [address], [place], [place]   [facility] ([place].) [place].,  [place], [place] [phone] or [phone]   [facility] Services (RTS) [address], [place], [place] Accepts Medicaid  [facility] [place] [facility] [address]  [place] [phone] Substance Abuse/Addiction Treatment   [facility] Organization         Address  Phone  Notes  [facility]  [phone]   Domenic Schwab, PhD [address] Arlis Porta [place], [place]   [phone] or [phone]   [place], [place] [phone]   [facility]  [address], [place], [place] [phone] Insurance/Medicaid/sponsorship through [facility] and Families [address]                                    [place], [place] [phone] [place] [address], [place] [phone]    Dr. Adele Schilder  [phone]   [facility] Dept. 1) [address], [place] 2) [place] 3)  [place], [place] [phone]  [phone]   [place] [phone] or [phone] (After Hours)

## 2014-05-14 ENCOUNTER — Emergency Department (HOSPITAL_COMMUNITY)
Admission: EM | Admit: 2014-05-14 | Discharge: 2014-05-14 | Disposition: A | Discharge status: Home / Self Care | Payer: Medicaid Other | Attending: Emergency Medicine | Admitting: Emergency Medicine

## 2014-05-14 ENCOUNTER — Encounter (HOSPITAL_COMMUNITY): Payer: Self-pay | Admitting: Emergency Medicine

## 2014-05-14 DIAGNOSIS — R739 Hyperglycemia, unspecified: Secondary | ICD-10-CM

## 2014-05-14 DIAGNOSIS — Z720 Tobacco use: Secondary | ICD-10-CM | POA: Insufficient documentation

## 2014-05-14 DIAGNOSIS — F329 Major depressive disorder, single episode, unspecified: Secondary | ICD-10-CM | POA: Insufficient documentation

## 2014-05-14 DIAGNOSIS — Z8619 Personal history of other infectious and parasitic diseases: Secondary | ICD-10-CM | POA: Insufficient documentation

## 2014-05-14 DIAGNOSIS — I10 Essential (primary) hypertension: Secondary | ICD-10-CM | POA: Insufficient documentation

## 2014-05-14 DIAGNOSIS — Z79899 Other long term (current) drug therapy: Secondary | ICD-10-CM | POA: Insufficient documentation

## 2014-05-14 DIAGNOSIS — Z8739 Personal history of other diseases of the musculoskeletal system and connective tissue: Secondary | ICD-10-CM | POA: Insufficient documentation

## 2014-05-14 DIAGNOSIS — E1165 Type 2 diabetes mellitus with hyperglycemia: Secondary | ICD-10-CM | POA: Insufficient documentation

## 2014-05-14 LAB — COMPREHENSIVE METABOLIC PANEL
ALK PHOS: 78 U/L (ref 39–117)
ALT: 42 U/L (ref 0–53)
AST: 20 U/L (ref 0–37)
Albumin: 4.4 g/dL (ref 3.5–5.2)
Anion gap: 13 (ref 5–15)
BILIRUBIN TOTAL: 0.3 mg/dL (ref 0.3–1.2)
BUN: 16 mg/dL (ref 6–23)
CO2: 26 mEq/L (ref 19–32)
Calcium: 9.5 mg/dL (ref 8.4–10.5)
Chloride: 95 mEq/L — ABNORMAL LOW (ref 96–112)
Creatinine, Ser: 0.55 mg/dL (ref 0.50–1.35)
GFR calc non Af Amer: >90 mL/min (ref >90)
GLUCOSE: 421 mg/dL — AB (ref 70–99)
Potassium: 4.3 mEq/L (ref 3.7–5.3)
SODIUM: 134 meq/L — AB (ref 137–147)
Total Protein: 8.6 g/dL — ABNORMAL HIGH (ref 6.0–8.3)

## 2014-05-14 LAB — URINALYSIS, ROUTINE W REFLEX MICROSCOPIC
BILIRUBIN URINE: NEGATIVE
Glucose, UA: >1000 mg/dL — AB
Hgb urine dipstick: NEGATIVE
KETONES UR: NEGATIVE mg/dL
Leukocytes, UA: NEGATIVE
Nitrite: NEGATIVE
PH: 5 (ref 5.0–8.0)
Protein, ur: NEGATIVE mg/dL
Specific Gravity, Urine: 1.044 — ABNORMAL HIGH (ref 1.005–1.030)
Urobilinogen, UA: 0.2 mg/dL (ref 0.0–1.0)

## 2014-05-14 LAB — URINE MICROSCOPIC-ADD ON

## 2014-05-14 LAB — CBC
HCT: 43.8 % (ref 39.0–52.0)
Hemoglobin: 15.6 g/dL (ref 13.0–17.0)
MCH: 30.2 pg (ref 26.0–34.0)
MCHC: 35.6 g/dL (ref 30.0–36.0)
MCV: 84.7 fL (ref 78.0–100.0)
Platelets: 140 10*3/uL — ABNORMAL LOW (ref 150–400)
RBC: 5.17 MIL/uL (ref 4.22–5.81)
RDW: 14.2 % (ref 11.5–15.5)
WBC: 7.3 10*3/uL (ref 4.0–10.5)

## 2014-05-14 LAB — CBG MONITORING, ED
GLUCOSE-CAPILLARY: 414 mg/dL — AB (ref 70–99)
GLUCOSE-CAPILLARY: 357 mg/dL — AB (ref 70–99)
GLUCOSE-CAPILLARY: 245 mg/dL — AB (ref 70–99)

## 2014-05-14 MED ORDER — SODIUM CHLORIDE 0.9 % IV BOLUS (SEPSIS)
1000.0000 mL | INTRAVENOUS | Status: AC
  Administered 2014-05-14: 1000 mL via INTRAVENOUS
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
## 2014-05-14 NOTE — ED Notes (Signed)
Per EMS: Pt from home. States he recently was changed to Levemir 2 weeks ago. States since starting new medication his BG has steadily increased. BG 420 today. States normal for him is 220. Denies any other complaints. Pt ambulatory to triage.

## 2014-05-14 NOTE — ED Notes (Signed)
CBG is 357. Notified Nurse Albin Felling.

## 2014-05-14 NOTE — Discharge Instructions (Signed)

## 2014-05-17 NOTE — ED Provider Notes (Signed)
Medical screening examination/treatment/procedure(s) were performed by non-physician practitioner and as supervising physician I was immediately available for consultation/collaboration.   EKG Interpretation None        Jahzion Brogden, MD 05/17/14 1422 

## 2014-05-18 ENCOUNTER — Encounter (HOSPITAL_COMMUNITY): Payer: Self-pay | Admitting: Emergency Medicine

## 2014-05-18 ENCOUNTER — Emergency Department (HOSPITAL_COMMUNITY)
Admission: EM | Admit: 2014-05-18 | Discharge: 2014-05-18 | Disposition: A | Discharge status: Home / Self Care | Payer: Medicaid Other | Attending: Emergency Medicine | Admitting: Emergency Medicine

## 2014-05-18 DIAGNOSIS — F319 Bipolar disorder, unspecified: Secondary | ICD-10-CM | POA: Insufficient documentation

## 2014-05-18 DIAGNOSIS — Z720 Tobacco use: Secondary | ICD-10-CM | POA: Insufficient documentation

## 2014-05-18 DIAGNOSIS — Z794 Long term (current) use of insulin: Secondary | ICD-10-CM | POA: Insufficient documentation

## 2014-05-18 DIAGNOSIS — M5137 Other intervertebral disc degeneration, lumbosacral region: Secondary | ICD-10-CM | POA: Insufficient documentation

## 2014-05-18 DIAGNOSIS — Z8619 Personal history of other infectious and parasitic diseases: Secondary | ICD-10-CM | POA: Insufficient documentation

## 2014-05-18 DIAGNOSIS — K0889 Other specified disorders of teeth and supporting structures: Secondary | ICD-10-CM

## 2014-05-18 DIAGNOSIS — Z79899 Other long term (current) drug therapy: Secondary | ICD-10-CM | POA: Insufficient documentation

## 2014-05-18 DIAGNOSIS — E119 Type 2 diabetes mellitus without complications: Secondary | ICD-10-CM | POA: Insufficient documentation

## 2014-05-18 DIAGNOSIS — I10 Essential (primary) hypertension: Secondary | ICD-10-CM | POA: Insufficient documentation

## 2014-05-18 DIAGNOSIS — K088 Other specified disorders of teeth and supporting structures: Secondary | ICD-10-CM | POA: Insufficient documentation

## 2014-05-18 DIAGNOSIS — K029 Dental caries, unspecified: Secondary | ICD-10-CM | POA: Insufficient documentation

## 2014-05-18 MED ORDER — HYDROCODONE-ACETAMINOPHEN 5-325 MG PO TABS: 1.0000 | ORAL_TABLET | Freq: Four times a day (QID) | ORAL | Status: DC | PRN

## 2014-05-18 MED ORDER — HYDROCODONE-ACETAMINOPHEN 5-325 MG PO TABS
1.0000 | ORAL_TABLET | Freq: Once | ORAL | Status: AC
  Administered 2014-05-18: 1 via ORAL
  Filled 2014-05-18: qty 1

## 2014-05-18 MED ORDER — IBUPROFEN 800 MG PO TABS: 800.0000 mg | ORAL_TABLET | Freq: Three times a day (TID) | ORAL | Status: DC

## 2014-05-18 NOTE — ED Notes (Addendum)
Pt states that he broke his front tooth this morning. Pt states that he tried to be seen at [facility] but could not. Pt states that it hurts to eat or drink. Pt states that 800mg  Ibuprophen has not worked.

## 2014-05-18 NOTE — Discharge Instructions (Signed)
You have a dental injury. Use the resource guide listed below to help you find a dentist if you do not already have one to followup with. It is very important that you get evaluated by a dentist as soon as possible. Call tomorrow to schedule an appointment. Use your pain medication as prescribed and do not operate heavy machinery while on pain medication. Note that your pain medication contains acetaminophen (Tylenol) & its is not reccommended that you use additional acetaminophen (Tylenol) while taking this medication. Read the instructions below. ° °Eat a soft or liquid diet and rinse your mouth out after meals with warm water. You should see a dentist or return here at once if you have increased swelling, increased pain or uncontrolled bleeding from the site of your injury. ° ° °SEEK MEDICAL CARE IF:  °· You have increased pain not controlled with medicines.  °· You have swelling around your tooth, in your face or neck.  °· You have bleeding which starts, continues, or gets worse.  °· You have a fever >101 °· If you are unable to open your mouth ° °RESOURCE GUIDE ° °Dental Problems ° °Patients with Medicaid: °[facility]                     [facility] °[address]                                           [address] °Phone:  [phone]                                                  Phone:  [phone] ° °If unable to pay or uninsured, contact:  [facility] or [facility] to become qualified for the adult dental clinic. ° °Chronic Pain Problems °Contact [facility]  [phone] °Patients need to be referred by their primary care doctor. ° °Insufficient Money for Medicine °Contact [employer] Way:  call "211" or [facility] 271-5999. ° °No Primary Care Doctor °Call [facility]  832-8000 °Other agencies that provide inexpensive medical care °   [facility]  [phone] °   [facility]  [phone] °   [facility]  [phone] °   Women's Clinic  832-4777 °   Planned Parenthood  [phone] °   [facility]  272-1050 ° °Psychological Services °[facility]  [phone] °[facility]  [phone] °[place]   [phone] (emergency services [phone]) ° °Substance Abuse Resources °[facility]  336-882-2125 °[facility] 336-784-9470 °[facility] 336-285-9073 °[facility] 336-845-3988 °Residential & Outpatient Substance Abuse Program  800-659-3381 ° °Abuse/Neglect °[place] Child Abuse Hotline (336) 641-3795 °[place] Child Abuse Hotline 800-378-5315 (After Hours) ° °Emergency Shelter °[facility] (336) 271-5985 ° °Maternity Homes °[facility] (336) 275-9566 °[facility] (704) 372-4663 ° °MRSA Hotline #:   832-7006 ° ° ° °[place] Resources ° °[facility] [place]     [employer]                            [facility] °[address] [place]                       [address]      [address]  °[place]                                                [place]                            [place] °Phone:  349-3220                                   Phone:  [phone]                 Phone:  342-8140 ° °[place] [facility] °Phone:  342-8316 ° °[place] Child Abuse Hotline °(336) 342-1394 °(336) 342-3537 (After Hours) ° °
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
## 2014-05-20 NOTE — ED Provider Notes (Signed)
Medical screening examination/treatment/procedure(s) were performed by non-physician practitioner and as supervising physician I was immediately available for consultation/collaboration.   EKG Interpretation None       Derwood Kaplan, MD 05/20/14 2324

## 2014-08-26 ENCOUNTER — Encounter (HOSPITAL_COMMUNITY): Payer: Self-pay

## 2014-08-26 ENCOUNTER — Emergency Department (HOSPITAL_COMMUNITY)
Admission: EM | Admit: 2014-08-26 | Discharge: 2014-08-26 | Disposition: A | Discharge status: Home / Self Care | Payer: Medicaid Other | Attending: Emergency Medicine | Admitting: Emergency Medicine

## 2014-08-26 DIAGNOSIS — M545 Low back pain: Secondary | ICD-10-CM | POA: Insufficient documentation

## 2014-08-26 DIAGNOSIS — Z8719 Personal history of other diseases of the digestive system: Secondary | ICD-10-CM | POA: Insufficient documentation

## 2014-08-26 DIAGNOSIS — Z8619 Personal history of other infectious and parasitic diseases: Secondary | ICD-10-CM | POA: Insufficient documentation

## 2014-08-26 DIAGNOSIS — Z79899 Other long term (current) drug therapy: Secondary | ICD-10-CM | POA: Insufficient documentation

## 2014-08-26 DIAGNOSIS — E119 Type 2 diabetes mellitus without complications: Secondary | ICD-10-CM | POA: Insufficient documentation

## 2014-08-26 DIAGNOSIS — F319 Bipolar disorder, unspecified: Secondary | ICD-10-CM | POA: Insufficient documentation

## 2014-08-26 DIAGNOSIS — I10 Essential (primary) hypertension: Secondary | ICD-10-CM | POA: Insufficient documentation

## 2014-08-26 DIAGNOSIS — Z794 Long term (current) use of insulin: Secondary | ICD-10-CM | POA: Insufficient documentation

## 2014-08-26 DIAGNOSIS — Z720 Tobacco use: Secondary | ICD-10-CM | POA: Insufficient documentation

## 2014-08-26 HISTORY — DX: Irritable bowel syndrome, unspecified: K58.9

## 2014-08-26 MED ORDER — DICYCLOMINE HCL 20 MG PO TABS: 20.0000 mg | ORAL_TABLET | Freq: Three times a day (TID) | ORAL | Status: DC

## 2014-08-26 MED ORDER — HYDROCODONE-ACETAMINOPHEN 5-325 MG PO TABS: 1.0000 | ORAL_TABLET | Freq: Four times a day (QID) | ORAL | Status: DC | PRN

## 2014-08-26 NOTE — ED Notes (Signed)
Patient verbalizes understanding of discharge instructions, prescription medications, home care and follow up care if needed. Patient ambulatory out of department at this time. 

## 2014-08-26 NOTE — Discharge Instructions (Signed)
Ibuprofen 600 mg 3 times daily for the next 5 days.  Hydrocodone as prescribed as needed for pain not relieved with ibuprofen.  Return to the emergency department if your symptoms substantially worsen or change.   Back Pain, Adult Low back pain is very common. About 1 in 5 people have back pain.The cause of low back pain is rarely dangerous. The pain often gets better over time.About half of people with a sudden onset of back pain feel better in just 2 weeks. About 8 in 10 people feel better by 6 weeks.  CAUSES Some common causes of back pain include:  Strain of the muscles or ligaments supporting the spine.  Wear and tear (degeneration) of the spinal discs.  Arthritis.  Direct injury to the back. DIAGNOSIS Most of the time, the direct cause of low back pain is not known.However, back pain can be treated effectively even when the exact cause of the pain is unknown.Answering your caregiver's questions about your overall health and symptoms is one of the most accurate ways to make sure the cause of your pain is not dangerous. If your caregiver needs more information, he or she may order lab work or imaging tests (X-rays or MRIs).However, even if imaging tests show changes in your back, this usually does not require surgery. HOME CARE INSTRUCTIONS For many people, back pain returns.Since low back pain is rarely dangerous, it is often a condition that people can learn to [facility] their own.   Remain active. It is stressful on the back to sit or stand in one place. Do not sit, drive, or stand in one place for more than 30 minutes at a time. Take short walks on level surfaces as soon as pain allows.Try to increase the length of time you walk each day.  Do not stay in bed.Resting more than 1 or 2 days can delay your recovery.  Do not avoid exercise or work.Your body is made to move.It is not dangerous to be active, even though your back may hurt.Your back will likely heal faster if  you return to being active before your pain is gone.  Pay attention to your body when you bend and lift. Many people have less discomfortwhen lifting if they bend their knees, keep the load close to their bodies,and avoid twisting. Often, the most comfortable positions are those that put less stress on your recovering back.  Find a comfortable position to sleep. Use a firm mattress and lie on your side with your knees slightly bent. If you lie on your back, put a pillow under your knees.  Only take over-the-counter or prescription medicines as directed by your caregiver. Over-the-counter medicines to reduce pain and inflammation are often the most helpful.Your caregiver may prescribe muscle relaxant drugs.These medicines help dull your pain so you can more quickly return to your normal activities and healthy exercise.  Put ice on the injured area.  Put ice in a plastic bag.  Place a towel between your skin and the bag.  Leave the ice on for 15-20 minutes, 03-04 times a day for the first 2 to 3 days. After that, ice and heat may be alternated to reduce pain and spasms.  Ask your caregiver about trying back exercises and gentle massage. This may be of some benefit.  Avoid feeling anxious or stressed.Stress increases muscle tension and can worsen back pain.It is important to recognize when you are anxious or stressed and learn ways to manage it.Exercise is a great option. SEEK MEDICAL  CARE IF:  You have pain that is not relieved with rest or medicine.  You have pain that does not improve in 1 week.  You have new symptoms.  You are generally not feeling well. SEEK IMMEDIATE MEDICAL CARE IF:   You have pain that radiates from your back into your legs.  You develop new bowel or bladder control problems.  You have unusual weakness or numbness in your arms or legs.  You develop nausea or vomiting.  You develop abdominal pain.  You feel faint. Document Released: 07/15/2005  Document Revised: 01/14/2012 Document Reviewed: 11/16/2013 [facility] Patient Information 2015 [place], [place]. This information is not intended to replace advice given to you by your health care provider. Make sure you discuss any questions you have with your health care provider.
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
## 2014-08-26 NOTE — ED Notes (Signed)
Patient states back pain for 2 days and this morning he said the pain is worse.

## 2014-09-20 ENCOUNTER — Emergency Department (HOSPITAL_COMMUNITY)
Admission: EM | Admit: 2014-09-20 | Discharge: 2014-09-20 | Disposition: A | Discharge status: Home / Self Care | Payer: Medicaid Other | Attending: Emergency Medicine | Admitting: Emergency Medicine

## 2014-09-20 ENCOUNTER — Encounter (HOSPITAL_COMMUNITY): Payer: Self-pay | Admitting: Emergency Medicine

## 2014-09-20 DIAGNOSIS — M5136 Other intervertebral disc degeneration, lumbar region: Secondary | ICD-10-CM

## 2014-09-20 DIAGNOSIS — Y9389 Activity, other specified: Secondary | ICD-10-CM | POA: Insufficient documentation

## 2014-09-20 DIAGNOSIS — M5137 Other intervertebral disc degeneration, lumbosacral region: Secondary | ICD-10-CM | POA: Insufficient documentation

## 2014-09-20 DIAGNOSIS — X58XXXA Exposure to other specified factors, initial encounter: Secondary | ICD-10-CM | POA: Insufficient documentation

## 2014-09-20 DIAGNOSIS — G8929 Other chronic pain: Secondary | ICD-10-CM | POA: Insufficient documentation

## 2014-09-20 DIAGNOSIS — Z791 Long term (current) use of non-steroidal anti-inflammatories (NSAID): Secondary | ICD-10-CM | POA: Insufficient documentation

## 2014-09-20 DIAGNOSIS — E119 Type 2 diabetes mellitus without complications: Secondary | ICD-10-CM | POA: Insufficient documentation

## 2014-09-20 DIAGNOSIS — Y990 Civilian activity done for income or pay: Secondary | ICD-10-CM | POA: Insufficient documentation

## 2014-09-20 DIAGNOSIS — T148XXA Other injury of unspecified body region, initial encounter: Secondary | ICD-10-CM

## 2014-09-20 DIAGNOSIS — Z794 Long term (current) use of insulin: Secondary | ICD-10-CM | POA: Insufficient documentation

## 2014-09-20 DIAGNOSIS — Z79899 Other long term (current) drug therapy: Secondary | ICD-10-CM | POA: Insufficient documentation

## 2014-09-20 DIAGNOSIS — Z8619 Personal history of other infectious and parasitic diseases: Secondary | ICD-10-CM | POA: Insufficient documentation

## 2014-09-20 DIAGNOSIS — F319 Bipolar disorder, unspecified: Secondary | ICD-10-CM | POA: Insufficient documentation

## 2014-09-20 DIAGNOSIS — S39012A Strain of muscle, fascia and tendon of lower back, initial encounter: Secondary | ICD-10-CM | POA: Insufficient documentation

## 2014-09-20 DIAGNOSIS — Z720 Tobacco use: Secondary | ICD-10-CM | POA: Insufficient documentation

## 2014-09-20 DIAGNOSIS — I10 Essential (primary) hypertension: Secondary | ICD-10-CM | POA: Insufficient documentation

## 2014-09-20 DIAGNOSIS — K589 Irritable bowel syndrome without diarrhea: Secondary | ICD-10-CM | POA: Insufficient documentation

## 2014-09-20 DIAGNOSIS — Y9289 Other specified places as the place of occurrence of the external cause: Secondary | ICD-10-CM | POA: Insufficient documentation

## 2014-09-20 HISTORY — DX: Other chronic pain: G89.29

## 2014-09-20 HISTORY — DX: Dorsalgia, unspecified: M54.9

## 2014-09-20 MED ORDER — ACETAMINOPHEN 325 MG PO TABS
650.0000 mg | ORAL_TABLET | Freq: Once | ORAL | Status: AC
  Administered 2014-09-20: 650 mg via ORAL
  Filled 2014-09-20: qty 2

## 2014-09-20 MED ORDER — DIAZEPAM 5 MG PO TABS
10.0000 mg | ORAL_TABLET | Freq: Once | ORAL | Status: AC
  Administered 2014-09-20: 10 mg via ORAL
  Filled 2014-09-20: qty 2

## 2014-09-20 MED ORDER — KETOROLAC TROMETHAMINE 10 MG PO TABS
10.0000 mg | ORAL_TABLET | Freq: Once | ORAL | Status: AC
  Administered 2014-09-20: 10 mg via ORAL
  Filled 2014-09-20: qty 1

## 2014-09-20 MED ORDER — BACLOFEN 10 MG PO TABS: 10.0000 mg | ORAL_TABLET | Freq: Three times a day (TID) | ORAL | Status: AC

## 2014-09-20 MED ORDER — CELECOXIB 100 MG PO CAPS
100.0000 mg | ORAL_CAPSULE | Freq: Two times a day (BID) | ORAL | Status: DC
Start: 2014-09-20 — End: 2014-11-10

## 2014-09-20 NOTE — ED Notes (Signed)
Pt reports h/s of chronic back pain and sciatica. C/o lower back pain since yesterday. Denies injury. Denies gi/gu. nad noted.
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
## 2014-09-20 NOTE — Discharge Instructions (Signed)
Muscle Strain A muscle strain (pulled muscle) happens when a muscle is stretched beyond normal length. It happens when a sudden, violent force stretches your muscle too far. Usually, a few of the fibers in your muscle are torn. Muscle strain is common in athletes. Recovery usually takes 1-2 weeks. Complete healing takes 5-6 weeks.  HOME CARE   Follow the PRICE method of treatment to help your injury get better. Do this the first 2-3 days after the injury:  Protect. Protect the muscle to keep it from getting injured again.  Rest. Limit your activity and rest the injured body part.  Ice. Put ice in a plastic bag. Place a towel between your skin and the bag. Then, apply the ice and leave it on from 15-20 minutes each hour. After the third day, switch to moist heat packs.  Compression. Use a splint or elastic bandage on the injured area for comfort. Do not put it on too tightly.  Elevate. Keep the injured body part above the level of your heart.  Only take medicine as told by your doctor.  Warm up before doing exercise to prevent future muscle strains. GET HELP IF:   You have more pain or puffiness (swelling) in the injured area.  You feel numbness, tingling, or notice a loss of strength in the injured area. MAKE SURE YOU:   Understand these instructions.  Will watch your condition.  Will get help right away if you are not doing well or get worse. Document Released: 04/23/2008 Document Revised: 05/05/2013 Document Reviewed: 02/11/2013 [facility] Patient Information 2015 [place], [place]. This information is not intended to replace advice given to you by your health care provider. Make sure you discuss any questions you have with your health care provider.  Degenerative Disk Disease Degenerative disk disease is a condition caused by the changes that occur in the cushions of the backbone (spinal disks) as you grow older. Spinal disks are soft and compressible disks located between the bones  of the spine (vertebrae). They act like shock absorbers. Degenerative disk disease can affect the whole spine. However, the neck and lower back are most commonly affected. Many changes can occur in the spinal disks with aging, such as:  The spinal disks may dry and shrink.  Small tears may occur in the tough, outer covering of the disk (annulus).  The disk space may become smaller due to loss of water.  Abnormal growths in the bone (spurs) may occur. This can put pressure on the nerve roots exiting the spinal canal, causing pain.  The spinal canal may become narrowed. CAUSES  Degenerative disk disease is a condition caused by the changes that occur in the spinal disks with aging. The exact cause is not known, but there is a genetic basis for many patients. Degenerative changes can occur due to loss of fluid in the disk. This makes the disk thinner and reduces the space between the backbones. Small cracks can develop in the outer layer of the disk. This can lead to the breakdown of the disk. You are more likely to get degenerative disk disease if you are overweight. Smoking cigarettes and doing heavy work such as weightlifting can also increase your risk of this condition. Degenerative changes can start after a sudden injury. Growth of bone spurs can compress the nerve roots and cause pain.  SYMPTOMS  The symptoms vary from person to person. Some people may have no pain, while others have severe pain. The pain may be so severe that  it can limit your activities. The location of the pain depends on the part of your backbone that is affected. You will have neck or arm pain if a disk in the neck area is affected. You will have pain in your back, buttocks, or legs if a disk in the lower back is affected. The pain becomes worse while bending, reaching up, or with twisting movements. The pain may start gradually and then get worse as time passes. It may also start after a major or minor injury. You may feel  numbness or tingling in the arms or legs.  DIAGNOSIS  Your caregiver will ask you about your symptoms and about activities or habits that may cause the pain. He or she may also ask about any injuries, diseases, or treatments you have had earlier. Your caregiver will examine you to check for the range of movement that is possible in the affected area, to check for strength in your extremities, and to check for sensation in the areas of the arms and legs supplied by different nerve roots. An X-ray of the spine may be taken. Your caregiver may suggest other imaging tests, such as magnetic resonance imaging (MRI), if needed.  TREATMENT  Treatment includes rest, modifying your activities, and applying ice and heat. Your caregiver may prescribe medicines to reduce your pain and may ask you to do some exercises to strengthen your back. In some cases, you may need surgery. You and your caregiver will decide on the treatment that is best for you. HOME CARE INSTRUCTIONS   Follow proper lifting and walking techniques as advised by your caregiver.  Maintain good posture.  Exercise regularly as advised.  Perform relaxation exercises.  Change your sitting, standing, and sleeping habits as advised. Change positions frequently.  Lose weight as advised.  Stop smoking if you smoke.  Wear supportive footwear. SEEK MEDICAL CARE IF:  Your pain does not go away within 1 to 4 weeks. SEEK IMMEDIATE MEDICAL CARE IF:   Your pain is severe.  You notice weakness in your arms, hands, or legs.  You begin to lose control of your bladder or bowel movements. MAKE SURE YOU:   Understand these instructions.  Will watch your condition.  Will get help right away if you are not doing well or get worse. Document Released: 05/12/2007 Document Revised: 10/07/2011 Document Reviewed: 11/16/2013 [facility] Patient Information 2015 [place], [place]. This information is not intended to replace advice given to you by your  health care provider. Make sure you discuss any questions you have with your health care provider.

## 2014-09-21 ENCOUNTER — Emergency Department (HOSPITAL_COMMUNITY)
Admission: EM | Admit: 2014-09-21 | Discharge: 2014-09-21 | Disposition: A | Discharge status: Home / Self Care | Payer: Medicaid Other | Attending: Emergency Medicine | Admitting: Emergency Medicine

## 2014-09-21 ENCOUNTER — Encounter (HOSPITAL_COMMUNITY): Payer: Self-pay | Admitting: Emergency Medicine

## 2014-09-21 DIAGNOSIS — Z8719 Personal history of other diseases of the digestive system: Secondary | ICD-10-CM | POA: Insufficient documentation

## 2014-09-21 DIAGNOSIS — I10 Essential (primary) hypertension: Secondary | ICD-10-CM | POA: Insufficient documentation

## 2014-09-21 DIAGNOSIS — E119 Type 2 diabetes mellitus without complications: Secondary | ICD-10-CM | POA: Insufficient documentation

## 2014-09-21 DIAGNOSIS — M549 Dorsalgia, unspecified: Secondary | ICD-10-CM

## 2014-09-21 DIAGNOSIS — Z79899 Other long term (current) drug therapy: Secondary | ICD-10-CM | POA: Insufficient documentation

## 2014-09-21 DIAGNOSIS — Z791 Long term (current) use of non-steroidal anti-inflammatories (NSAID): Secondary | ICD-10-CM | POA: Insufficient documentation

## 2014-09-21 DIAGNOSIS — Z794 Long term (current) use of insulin: Secondary | ICD-10-CM | POA: Insufficient documentation

## 2014-09-21 DIAGNOSIS — G8929 Other chronic pain: Secondary | ICD-10-CM | POA: Insufficient documentation

## 2014-09-21 DIAGNOSIS — Z8739 Personal history of other diseases of the musculoskeletal system and connective tissue: Secondary | ICD-10-CM | POA: Insufficient documentation

## 2014-09-21 DIAGNOSIS — M545 Low back pain: Secondary | ICD-10-CM | POA: Insufficient documentation

## 2014-09-21 DIAGNOSIS — F319 Bipolar disorder, unspecified: Secondary | ICD-10-CM | POA: Insufficient documentation

## 2014-09-21 DIAGNOSIS — Z8619 Personal history of other infectious and parasitic diseases: Secondary | ICD-10-CM | POA: Insufficient documentation

## 2014-09-21 DIAGNOSIS — Z720 Tobacco use: Secondary | ICD-10-CM | POA: Insufficient documentation

## 2014-09-21 MED ORDER — METHOCARBAMOL 500 MG PO TABS: 1000.0000 mg | ORAL_TABLET | Freq: Four times a day (QID) | ORAL | Status: DC | PRN

## 2014-09-21 NOTE — ED Notes (Signed)
PT stated he was seen yesterday in ED for lower back pain and reports no relief from pain with medications that were given. PT denies any urinary symptoms and reports hx of lower back pain and DJD.
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
## 2014-09-21 NOTE — Discharge Instructions (Signed)
°Emergency Department Resource Guide °1) Find a Doctor and Pay Out of Pocket °Although you won't have to find out who is covered by your insurance plan, it is a good idea to ask around and get recommendations. You will then need to call the office and see if the doctor you have chosen will accept you as a new patient and what types of options they offer for patients who are self-pay. Some doctors offer discounts or will set up payment plans for their patients who do not have insurance, but you will need to ask so you aren't surprised when you get to your appointment. ° °2) Contact Your Local Health Department °Not all health departments have doctors that can see patients for sick visits, but many do, so it is worth a call to see if yours does. If you don't know where your local health department is, you can check in your phone book. The CDC also has a tool to help you locate your state's health department, and many state websites also have listings of all of their local health departments. ° °3) Find a Walk-in Clinic °If your illness is not likely to be very severe or complicated, you may want to try a walk in clinic. These are popping up all over the country in pharmacies, drugstores, and shopping centers. They're usually staffed by nurse practitioners or physician assistants that have been trained to treat common illnesses and complaints. They're usually fairly quick and inexpensive. However, if you have serious medical issues or chronic medical problems, these are probably not your best option. ° °No Primary Care Doctor: °- Call [facility] at  832-8000 - they can help you locate a primary care doctor that  accepts your insurance, provides certain services, etc. °- Physician Referral Service- 1-800-533-3463 ° °Chronic Pain Problems: °Organization         Address  Phone   Notes  °[facility]  (336) 297-2271 Patients need to be referred by their primary care doctor.  ° °Medication  Assistance: °Organization         Address  Phone   Notes  °[place] [facility] [address] °[place], [place] [address] (336) 641-8030 --Must be a resident of [place] °-- Must have NO insurance coverage whatsoever (no Medicaid/ Medicare, etc.) °-- The pt. MUST have a primary care doctor that directs their care regularly and follows them in the community °  °[facility]  (866) 331-1348   °[employer]  (888) 892-1162   ° °Agencies that provide inexpensive medical care: °Organization         Address  Phone   Notes  °[facility]  (336) 832-8035   °[facility]    (336) 832-7272   °[facility] [address] °[place], [place] [address] (336) 832-4777   °[facility] [address], °[place] (336) 271-4999   °[facility]    (336) 373-0678   °[facility]    (336) 272-1050   °[facility] ° [address], [place] Phone:  (336) 832-4444, Fax:  (336) 832-4440 Hours of Operation:  9 am - 6 pm, M-F.  Also accepts Medicaid/Medicare and self-pay.  °[facility] ° [address], [place] Phone: (336) 832-3150, Fax: (336) 832-3151. Hours of Operation:  8:30 am - 5:30 pm, M-F.  Also accepts Medicaid and self-pay.  °[facility] [place] [address], [place] Phone: (336) 878-6027   °[facility] [address], [place], [place] (336)723-1848, Ext. 123 Mondays & Thursdays: 7-9 AM.  First 15 patients are seen on a first come, first serve basis. °  ° °Medicaid-accepting [place] Providers: ° °Organization         Address  Phone   Notes  °[facility] [address], [place] (336) 641-2100 Also accepts self-pay patients.  °[facility] [address], [place] ° (336) 856-9996   °[facility] [address], [place]  (336) 288-8857   °[facility] [address] [place] [address], [place] (336) 299-7000   °Veita Bland [address], [place]  ° (336) 373-1557 Only accepts [place] Access Medicaid patients after they have their name applied to their card.  ° °Self-Pay (no insurance) in [place]: ° °Organization         Address  Phone   Notes  °Sickle Cell Patients, [facility] [address], [place] (336) 832-1970   °[facility] [address], [place] (336) 832-4400   °[facility] ° [address], [place] (336) 992-4800   °[facility]/Dr. Osei-Bonsu ° [address] [place] [address], [place] or [address], [place] (336) 841-8500 Phone number for both [place] and [place] locations is the same.  °[facility] [address], [place] (336) 299-0000   °[facility] [address] [place] [address], [place] or [address] (336) 852-7530 °(336) 878-2260   °[facility] [address], [place] (336) 350-1642, phone; (336) 294-5005, fax Sees patients 1st and 3rd Saturday of every month.  Must not qualify for public or private insurance (i.e. Medicaid, Medicare, [place] Health Choice, Veterans' Benefits) • Household income should be no more than 200% of the poverty level •The clinic cannot treat you if you are pregnant or think you are pregnant • Sexually transmitted diseases are not treated at the clinic.  ° ° °Dental Care: °Organization         Address  Phone  Notes  °[place] [facility] [address], [place] (336) 641-6152 Accepts children up to age 21 who are enrolled in Medicaid or [place] Health Choice; pregnant women with a Medicaid card; and children who have applied for Medicaid or [place] Health Choice, but were declined, whose parents can pay a reduced fee at time of service.  °[place]  [facility] [place]  [address], [place] (336) 641-7733 Accepts children up to age 21 who are enrolled in Medicaid or [place] Health Choice; pregnant women with a Medicaid card; and children who have applied for Medicaid or [place] Health Choice, but were declined, whose parents can pay a reduced fee at time of service.  °[facility] ° [address], [place] (336) 641-4533 Patients are seen by appointment only. Walk-ins are not accepted. [facility] will see patients 18 years of age and older. °Monday - Tuesday (8am-5pm) °Most Wednesdays (8:30-5pm) °$30 per visit, cash only  °[facility] ° [address], [place] (336) 641-4533 Patients are seen by appointment only. Walk-ins are not accepted. [facility] will see patients 18 years of age and older. °One   Wednesday Evening (Monthly: Volunteer Based).  $30 per visit, cash only  °[facility]  (919) 537-3737 for adults; Children under age 4, call Graduate Pediatric Dentistry at ([phone]) [phone]. Children aged 4-14, please call (919) [phone] to request a pediatric application. ° Dental services are provided in all areas of dental care including fillings, crowns and bridges, complete and partial dentures, implants, gum treatment, root canals, and extractions. Preventive care is also provided. Treatment is provided to both adults and children. °Patients are selected via a lottery and there is often a waiting list. °  °[facility] [address], °[place] ° (336) 763-8833 [email or web address] °  °[facility] [address], [place], [place] (336)723-1848, Ext. 123 Second and Fourth Thursday of each month, opens at 6:30 AM; Clinic ends at 9 AM.  Patients are seen on a first-come first-served basis, and a limited number are seen during each clinic.  ° °[facility] ° [address], [place], [place] (336) 723-7904    Eligibility Requirements °You must have lived in [place], [place], or [place] counties for at least the last three months. °  You cannot be eligible for state or federal sponsored healthcare insurance, including Veterans Administration, Medicaid, or Medicare. °  You generally cannot be eligible for healthcare insurance through your employer.  °  How to apply: °Eligibility screenings are held every Tuesday and Wednesday afternoon from 1:00 pm until 4:00 pm. You do not need an appointment for the interview!  °[facility] [address], [place], [place] 336-631-2330   °[facility]  336-342-8273   °[facility]  336-703-3100   °[facility]  336-570-6415   ° °Behavioral Health Resources in the Community: °Intensive Outpatient Programs °Organization         Address  Phone  Notes  °[facility] [address], [place], [place] 336-878-6098   °[facility] [address], [place], [place] 336-832-9800   °[facility] [address], [place], [place] ° 336-882-2125   °[facility] [address],  °[place], [place] 1-800-853-5163 or 336-641-4981   °Substance Abuse Resources °Organization         Address  Phone  Notes  °[facility]  336-882-2125   °[facility]  336-784-9470   °[facility]  336-285-9073   °[facility]  336-845-3988   °Residential & Outpatient Substance Abuse Program  1-800-659-3381   °Psychological Services °Organization         Address  Phone  Notes  °[facility]  336- 832-9600   °[facility]  336- 378-7881   °[facility] [address], [place] 1-800-853-5163 or 336-641-4981   ° °Mobile Crisis Teams °Organization         Address  Phone  Notes  °[facility], Mobile Crisis Care Unit  1-877-626-1772   °Assertive °[facility] ° [address]  [place], [place] 336-834-9664   °Sharon DeEsch [address] °[place] 336-554-5454   ° °Self-Help/Support Groups °Organization         Address  Phone             Notes  °[facility]  - variety of support groups  336- 373-1402 Call for more information  °Narcotics Anonymous (NA), [facility] [address], °[place]  2 meetings at this location  ° °  Residential Treatment Programs Organization         Address  Phone  Notes  [facility] [address]    [place]  3-383-291-9166   [facility]  [address] [place] [address], [place], [place] 997-741-4239   [facility] [address] [place], [place] 532-023-3435 Admissions: 8am-3pm M-F  [facility] [address]    [place], [place] 686-168-3729   [facility] [address] [place], [place], [place] [phone]   [facility] [address],  [place], [place] [phone]   [facility] - Intensive Outpatient [address], [place], [place] 497-530-0511   [facility] [address] [place]  [place], [place] 0-211-173-5670 or (601) 393-4585   [facility] ([facility] [address] [place], [place] 388-875-7972 Accepts Medicaid  [facility] [address],  [place] 8-206-015-6153 Substance Abuse/Addiction Treatment   [facility]         Address  Phone  Notes  [facility]  340-489-2209   Angie Fava, PhD [address] [place], [place]   [phone] or 4016323852   [facility]   [address] [place], [place] 914-766-9819   [facility] [address], [place], [place] [phone] Insurance/Medicaid/sponsorship through [facility] [address]                                    [place], [place] [phone] Therapy/tele-psych/case    [facility] [address], [place] [phone]    Dr. Lolly Mustache  [phone]   [facility]  [employer] [facility] Dept. 1) [address], [place] 2) [address] [place] 3)  [address], [place] [phone] (732)[phone]  782-615-0147   [facility] Child [facility] Hotline 418-085-6427 or 603-016-3570 (After Hours)      Take the new prescription (muscle relaxer) as directed.  Apply moist heat or ice to the area(s) of discomfort, for 15 minutes at a time, several times per day for the next few days.  Do not fall asleep on a heating or ice pack.  Call your regular medical doctor and the Pain Management doctor today to schedule a follow up appointment within the next week.  Return to the Emergency Department immediately if worsening.

## 2014-11-09 ENCOUNTER — Encounter (HOSPITAL_COMMUNITY): Payer: Self-pay | Admitting: *Deleted

## 2014-11-09 ENCOUNTER — Emergency Department (HOSPITAL_COMMUNITY): Payer: MEDICAID

## 2014-11-09 ENCOUNTER — Emergency Department (HOSPITAL_COMMUNITY)
Admission: EM | Admit: 2014-11-09 | Discharge: 2014-11-09 | Disposition: A | Discharge status: Home / Self Care | Payer: Self-pay | Attending: Emergency Medicine | Admitting: Emergency Medicine

## 2014-11-09 DIAGNOSIS — K589 Irritable bowel syndrome without diarrhea: Secondary | ICD-10-CM | POA: Insufficient documentation

## 2014-11-09 DIAGNOSIS — S92414A Nondisplaced fracture of proximal phalanx of right great toe, initial encounter for closed fracture: Secondary | ICD-10-CM | POA: Insufficient documentation

## 2014-11-09 DIAGNOSIS — F329 Major depressive disorder, single episode, unspecified: Secondary | ICD-10-CM | POA: Insufficient documentation

## 2014-11-09 DIAGNOSIS — W208XXA Other cause of strike by thrown, projected or falling object, initial encounter: Secondary | ICD-10-CM | POA: Insufficient documentation

## 2014-11-09 DIAGNOSIS — Y9389 Activity, other specified: Secondary | ICD-10-CM | POA: Insufficient documentation

## 2014-11-09 DIAGNOSIS — Z791 Long term (current) use of non-steroidal anti-inflammatories (NSAID): Secondary | ICD-10-CM | POA: Insufficient documentation

## 2014-11-09 DIAGNOSIS — Z720 Tobacco use: Secondary | ICD-10-CM | POA: Insufficient documentation

## 2014-11-09 DIAGNOSIS — S92911A Unspecified fracture of right toe(s), initial encounter for closed fracture: Secondary | ICD-10-CM

## 2014-11-09 DIAGNOSIS — G8929 Other chronic pain: Secondary | ICD-10-CM | POA: Insufficient documentation

## 2014-11-09 DIAGNOSIS — E119 Type 2 diabetes mellitus without complications: Secondary | ICD-10-CM | POA: Insufficient documentation

## 2014-11-09 DIAGNOSIS — Z794 Long term (current) use of insulin: Secondary | ICD-10-CM | POA: Insufficient documentation

## 2014-11-09 DIAGNOSIS — Y998 Other external cause status: Secondary | ICD-10-CM | POA: Insufficient documentation

## 2014-11-09 DIAGNOSIS — Z79899 Other long term (current) drug therapy: Secondary | ICD-10-CM | POA: Insufficient documentation

## 2014-11-09 DIAGNOSIS — Y9289 Other specified places as the place of occurrence of the external cause: Secondary | ICD-10-CM | POA: Insufficient documentation

## 2014-11-09 DIAGNOSIS — I10 Essential (primary) hypertension: Secondary | ICD-10-CM | POA: Insufficient documentation

## 2014-11-09 DIAGNOSIS — Z8739 Personal history of other diseases of the musculoskeletal system and connective tissue: Secondary | ICD-10-CM | POA: Insufficient documentation

## 2014-11-09 MED ORDER — IBUPROFEN 400 MG PO TABS
600.0000 mg | ORAL_TABLET | Freq: Once | ORAL | Status: AC
  Administered 2014-11-09: 600 mg via ORAL
  Filled 2014-11-09: qty 2

## 2014-11-09 MED ORDER — OXYCODONE-ACETAMINOPHEN 5-325 MG PO TABS
2.0000 | ORAL_TABLET | Freq: Once | ORAL | Status: AC
  Administered 2014-11-09: 2 via ORAL
  Filled 2014-11-09: qty 2

## 2014-11-09 MED ORDER — OXYCODONE-ACETAMINOPHEN 5-325 MG PO TABS: 1.0000 | ORAL_TABLET | ORAL | Status: DC | PRN

## 2014-11-09 MED ORDER — IBUPROFEN 600 MG PO TABS: 600.0000 mg | ORAL_TABLET | Freq: Four times a day (QID) | ORAL | Status: DC | PRN
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
## 2014-11-09 NOTE — Discharge Instructions (Signed)
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
## 2014-11-10 ENCOUNTER — Emergency Department (HOSPITAL_COMMUNITY): Payer: MEDICAID

## 2014-11-10 ENCOUNTER — Encounter (HOSPITAL_COMMUNITY): Payer: Self-pay | Admitting: *Deleted

## 2014-11-10 ENCOUNTER — Emergency Department (HOSPITAL_COMMUNITY)
Admission: EM | Admit: 2014-11-10 | Discharge: 2014-11-10 | Disposition: A | Discharge status: Home / Self Care | Payer: MEDICAID | Attending: Emergency Medicine | Admitting: Emergency Medicine

## 2014-11-10 DIAGNOSIS — M25569 Pain in unspecified knee: Secondary | ICD-10-CM

## 2014-11-10 DIAGNOSIS — M79671 Pain in right foot: Secondary | ICD-10-CM | POA: Insufficient documentation

## 2014-11-10 DIAGNOSIS — Z794 Long term (current) use of insulin: Secondary | ICD-10-CM | POA: Insufficient documentation

## 2014-11-10 DIAGNOSIS — K589 Irritable bowel syndrome without diarrhea: Secondary | ICD-10-CM | POA: Insufficient documentation

## 2014-11-10 DIAGNOSIS — E119 Type 2 diabetes mellitus without complications: Secondary | ICD-10-CM | POA: Insufficient documentation

## 2014-11-10 DIAGNOSIS — Z720 Tobacco use: Secondary | ICD-10-CM | POA: Insufficient documentation

## 2014-11-10 DIAGNOSIS — Z8659 Personal history of other mental and behavioral disorders: Secondary | ICD-10-CM | POA: Insufficient documentation

## 2014-11-10 DIAGNOSIS — Z791 Long term (current) use of non-steroidal anti-inflammatories (NSAID): Secondary | ICD-10-CM | POA: Insufficient documentation

## 2014-11-10 DIAGNOSIS — Z8619 Personal history of other infectious and parasitic diseases: Secondary | ICD-10-CM | POA: Insufficient documentation

## 2014-11-10 DIAGNOSIS — G8929 Other chronic pain: Secondary | ICD-10-CM | POA: Insufficient documentation

## 2014-11-10 DIAGNOSIS — I10 Essential (primary) hypertension: Secondary | ICD-10-CM | POA: Insufficient documentation

## 2014-11-10 DIAGNOSIS — M25561 Pain in right knee: Secondary | ICD-10-CM | POA: Insufficient documentation

## 2014-11-10 DIAGNOSIS — Z79899 Other long term (current) drug therapy: Secondary | ICD-10-CM | POA: Insufficient documentation

## 2014-11-10 LAB — CBG MONITORING, ED: Glucose-Capillary: 393 mg/dL — ABNORMAL HIGH (ref 70–99)

## 2014-11-10 MED ORDER — IBUPROFEN 800 MG PO TABS: 800.0000 mg | ORAL_TABLET | Freq: Three times a day (TID) | ORAL | Status: DC | PRN

## 2014-11-10 MED ORDER — IBUPROFEN 800 MG PO TABS
800.0000 mg | ORAL_TABLET | Freq: Once | ORAL | Status: AC
  Administered 2014-11-10: 800 mg via ORAL
  Filled 2014-11-10: qty 1

## 2014-11-10 MED ORDER — OXYCODONE-ACETAMINOPHEN 5-325 MG PO TABS
2.0000 | ORAL_TABLET | Freq: Once | ORAL | Status: AC
  Administered 2014-11-10: 2 via ORAL
  Filled 2014-11-10: qty 2

## 2014-11-10 MED ORDER — OXYCODONE-ACETAMINOPHEN 5-325 MG PO TABS: 1.0000 | ORAL_TABLET | Freq: Four times a day (QID) | ORAL | Status: DC | PRN

## 2014-11-10 NOTE — ED Notes (Signed)
Injury yesterday , seen here  For lt foot injury , Now says his lt knee hurts also and wants that checked.
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
## 2014-11-10 NOTE — Discharge Instructions (Signed)
Knee Pain °The knee is the complex joint between your thigh and your lower leg. It is made up of bones, tendons, ligaments, and cartilage. The bones that make up the knee are: °· The femur in the thigh. °· The tibia and fibula in the lower leg. °· The patella or kneecap riding in the groove on the lower femur. °CAUSES  °Knee pain is a common complaint with many causes. A few of these causes are: °· Injury, such as: °· A ruptured ligament or tendon injury. °· Torn cartilage. °· Medical conditions, such as: °· Gout °· Arthritis °· Infections °· Overuse, over training, or overdoing a physical activity. °Knee pain can be minor or severe. Knee pain can accompany debilitating injury. Minor knee problems often respond well to self-care measures or get well on their own. More serious injuries may need medical intervention or even surgery. °SYMPTOMS °The knee is complex. Symptoms of knee problems can vary widely. Some of the problems are: °· Pain with movement and weight bearing. °· Swelling and tenderness. °· Buckling of the knee. °· Inability to straighten or extend your knee. °· Your knee locks and you cannot straighten it. °· Warmth and redness with pain and fever. °· Deformity or dislocation of the kneecap. °DIAGNOSIS  °Determining what is wrong may be very straight forward such as when there is an injury. It can also be challenging because of the complexity of the knee. Tests to make a diagnosis may include: °· Your caregiver taking a history and doing a physical exam. °· Routine X-rays can be used to rule out other problems. X-rays will not reveal a cartilage tear. Some injuries of the knee can be diagnosed by: °¨ Arthroscopy a surgical technique by which a small video camera is inserted through tiny incisions on the sides of the knee. This procedure is used to examine and repair internal knee joint problems. Tiny instruments can be used during arthroscopy to repair the torn knee cartilage (meniscus). °¨ Arthrography  is a radiology technique. A contrast liquid is directly injected into the knee joint. Internal structures of the knee joint then become visible on X-ray film. °¨ An MRI scan is a non X-ray radiology procedure in which magnetic fields and a computer produce two- or three-dimensional images of the inside of the knee. Cartilage tears are often visible using an MRI scanner. MRI scans have largely replaced arthrography in diagnosing cartilage tears of the knee. °· Blood work. °· Examination of the fluid that helps to lubricate the knee joint (synovial fluid). This is done by taking a sample out using a needle and a syringe. °TREATMENT °The treatment of knee problems depends on the cause. Some of these treatments are: °· Depending on the injury, proper casting, splinting, surgery, or physical therapy care will be needed. °· Give yourself adequate recovery time. Do not overuse your joints. If you begin to get sore during workout routines, back off. Slow down or do fewer repetitions. °· For repetitive activities such as cycling or running, maintain your strength and nutrition. °· Alternate muscle groups. For example, if you are a weight lifter, work the upper body on one day and the lower body the next. °· Either tight or weak muscles do not give the proper support for your knee. Tight or weak muscles do not absorb the stress placed on the knee joint. Keep the muscles surrounding the knee strong. °· Take care of mechanical problems. °¨ If you have flat feet, orthotics or special shoes may help.   See your caregiver if you need help. °¨ Arch supports, sometimes with wedges on the inner or outer aspect of the heel, can help. These can shift pressure away from the side of the knee most bothered by osteoarthritis. °¨ A brace called an "unloader" brace also may be used to help ease the pressure on the most arthritic side of the knee. °· If your caregiver has prescribed crutches, braces, wraps or ice, use as directed. The acronym  for this is PRICE. This means protection, rest, ice, compression, and elevation. °· Nonsteroidal anti-inflammatory drugs (NSAIDs), can help relieve pain. But if taken immediately after an injury, they may actually increase swelling. Take NSAIDs with food in your stomach. Stop them if you develop stomach problems. Do not take these if you have a history of ulcers, stomach pain, or bleeding from the bowel. Do not take without your caregiver's approval if you have problems with fluid retention, heart failure, or kidney problems. °· For ongoing knee problems, physical therapy may be helpful. °· Glucosamine and chondroitin are over-the-counter dietary supplements. Both may help relieve the pain of osteoarthritis in the knee. These medicines are different from the usual anti-inflammatory drugs. Glucosamine may decrease the rate of cartilage destruction. °· Injections of a corticosteroid drug into your knee joint may help reduce the symptoms of an arthritis flare-up. They may provide pain relief that lasts a few months. You may have to wait a few months between injections. The injections do have a small increased risk of infection, water retention, and elevated blood sugar levels. °· Hyaluronic acid injected into damaged joints may ease pain and provide lubrication. These injections may work by reducing inflammation. A series of shots may give relief for as long as 6 months. °· Topical painkillers. Applying certain ointments to your skin may help relieve the pain and stiffness of osteoarthritis. Ask your pharmacist for suggestions. Many over the-counter products are approved for temporary relief of arthritis pain. °· In some countries, doctors often prescribe topical NSAIDs for relief of chronic conditions such as arthritis and tendinitis. A review of treatment with NSAID creams found that they worked as well as oral medications but without the serious side effects. °PREVENTION °· Maintain a healthy weight. Extra pounds  put more strain on your joints. °· Get strong, stay limber. Weak muscles are a common cause of knee injuries. Stretching is important. Include flexibility exercises in your workouts. °· Be smart about exercise. If you have osteoarthritis, chronic knee pain or recurring injuries, you may need to change the way you exercise. This does not mean you have to stop being active. If your knees ache after jogging or playing basketball, consider switching to swimming, water aerobics, or other low-impact activities, at least for a few days a week. Sometimes limiting high-impact activities will provide relief. °· Make sure your shoes fit well. Choose footwear that is right for your sport. °· Protect your knees. Use the proper gear for knee-sensitive activities. Use kneepads when playing volleyball or laying carpet. Buckle your seat belt every time you drive. Most shattered kneecaps occur in car accidents. °· Rest when you are tired. °SEEK MEDICAL CARE IF:  °You have knee pain that is continual and does not seem to be getting better.  °SEEK IMMEDIATE MEDICAL CARE IF:  °Your knee joint feels hot to the touch and you have a high fever. °MAKE SURE YOU:  °· Understand these instructions. °· Will watch your condition. °· Will get help right away if you are not   doing well or get worse. °Document Released: 05/12/2007 Document Revised: 10/07/2011 Document Reviewed: 05/12/2007 °[employer]® Patient Information ©2015 [employer]. This information is not intended to replace advice given to you by your health care provider. Make sure you discuss any questions you have with your health care provider. ° ° °RICE: Routine Care for Injuries °The routine care of many injuries includes Rest, Ice, Compression, and Elevation (RICE). °HOME CARE INSTRUCTIONS °· Rest is needed to allow your body to heal. Routine activities can usually be resumed when comfortable. Injured tendons and bones can take up to 6 weeks to heal. Tendons are the cord-like  structures that attach muscle to bone. °· Ice following an injury helps keep the swelling down and reduces pain. °¨ Put ice in a plastic bag. °¨ Place a towel between your skin and the bag. °¨ Leave the ice on for 15-20 minutes, 3-4 times a day, or as directed by your health care provider. Do this while awake, for the first 24 to 48 hours. After that, continue as directed by your caregiver. °· Compression helps keep swelling down. It also gives support and helps with discomfort. If an elastic bandage has been applied, it should be removed and reapplied every 3 to 4 hours. It should not be applied tightly, but firmly enough to keep swelling down. Watch fingers or toes for swelling, bluish discoloration, coldness, numbness, or excessive pain. If any of these problems occur, remove the bandage and reapply loosely. Contact your caregiver if these problems continue. °· Elevation helps reduce swelling and decreases pain. With extremities, such as the arms, hands, legs, and feet, the injured area should be placed near or above the level of the heart, if possible. °SEEK IMMEDIATE MEDICAL CARE IF: °· You have persistent pain and swelling. °· You develop redness, numbness, or unexpected weakness. °· Your symptoms are getting worse rather than improving after several days. °These symptoms may indicate that further evaluation or further X-rays are needed. Sometimes, X-rays may not show a small broken bone (fracture) until 1 week or 10 days later. Make a follow-up appointment with your caregiver. Ask when your X-ray results will be ready. Make sure you get your X-ray results. °Document Released: 10/27/2000 Document Revised: 07/20/2013 Document Reviewed: 12/14/2010 °[employer]® Patient Information ©2015 [employer]. This information is not intended to replace advice given to you by your health care provider. Make sure you discuss any questions you have with your health care provider. ° °

## 2014-12-11 ENCOUNTER — Emergency Department (HOSPITAL_COMMUNITY)
Admission: EM | Admit: 2014-12-11 | Discharge: 2014-12-11 | Disposition: A | Discharge status: Home / Self Care | Payer: MEDICAID | Attending: Emergency Medicine | Admitting: Emergency Medicine

## 2014-12-11 ENCOUNTER — Encounter (HOSPITAL_COMMUNITY): Payer: Self-pay | Admitting: Emergency Medicine

## 2014-12-11 ENCOUNTER — Emergency Department (HOSPITAL_COMMUNITY): Payer: Self-pay

## 2014-12-11 ENCOUNTER — Emergency Department (HOSPITAL_COMMUNITY): Payer: MEDICAID

## 2014-12-11 DIAGNOSIS — Z79899 Other long term (current) drug therapy: Secondary | ICD-10-CM | POA: Insufficient documentation

## 2014-12-11 DIAGNOSIS — Z8739 Personal history of other diseases of the musculoskeletal system and connective tissue: Secondary | ICD-10-CM | POA: Insufficient documentation

## 2014-12-11 DIAGNOSIS — Z8719 Personal history of other diseases of the digestive system: Secondary | ICD-10-CM | POA: Insufficient documentation

## 2014-12-11 DIAGNOSIS — Y9389 Activity, other specified: Secondary | ICD-10-CM | POA: Insufficient documentation

## 2014-12-11 DIAGNOSIS — Z760 Encounter for issue of repeat prescription: Secondary | ICD-10-CM | POA: Insufficient documentation

## 2014-12-11 DIAGNOSIS — Z794 Long term (current) use of insulin: Secondary | ICD-10-CM | POA: Insufficient documentation

## 2014-12-11 DIAGNOSIS — Y998 Other external cause status: Secondary | ICD-10-CM | POA: Insufficient documentation

## 2014-12-11 DIAGNOSIS — Z8659 Personal history of other mental and behavioral disorders: Secondary | ICD-10-CM | POA: Insufficient documentation

## 2014-12-11 DIAGNOSIS — Z720 Tobacco use: Secondary | ICD-10-CM | POA: Insufficient documentation

## 2014-12-11 DIAGNOSIS — E119 Type 2 diabetes mellitus without complications: Secondary | ICD-10-CM | POA: Insufficient documentation

## 2014-12-11 DIAGNOSIS — Y9289 Other specified places as the place of occurrence of the external cause: Secondary | ICD-10-CM | POA: Insufficient documentation

## 2014-12-11 DIAGNOSIS — X58XXXA Exposure to other specified factors, initial encounter: Secondary | ICD-10-CM | POA: Insufficient documentation

## 2014-12-11 DIAGNOSIS — I10 Essential (primary) hypertension: Secondary | ICD-10-CM | POA: Insufficient documentation

## 2014-12-11 DIAGNOSIS — G8929 Other chronic pain: Secondary | ICD-10-CM | POA: Insufficient documentation

## 2014-12-11 DIAGNOSIS — S20212A Contusion of left front wall of thorax, initial encounter: Secondary | ICD-10-CM | POA: Insufficient documentation

## 2014-12-11 DIAGNOSIS — Z8619 Personal history of other infectious and parasitic diseases: Secondary | ICD-10-CM | POA: Insufficient documentation

## 2014-12-11 MED ORDER — IBUPROFEN 800 MG PO TABS
800.0000 mg | ORAL_TABLET | Freq: Once | ORAL | Status: AC
  Administered 2014-12-11: 800 mg via ORAL
  Filled 2014-12-11: qty 1

## 2014-12-11 MED ORDER — DICYCLOMINE HCL 20 MG PO TABS: 20.0000 mg | ORAL_TABLET | Freq: Three times a day (TID) | ORAL | Status: DC

## 2014-12-11 MED ORDER — IBUPROFEN 800 MG PO TABS: 800.0000 mg | ORAL_TABLET | Freq: Three times a day (TID) | ORAL | Status: DC

## 2014-12-11 MED ORDER — OXYCODONE-ACETAMINOPHEN 5-325 MG PO TABS: 1.0000 | ORAL_TABLET | ORAL | Status: DC | PRN

## 2014-12-11 MED ORDER — OXYCODONE-ACETAMINOPHEN 5-325 MG PO TABS
1.0000 | ORAL_TABLET | Freq: Once | ORAL | Status: AC
  Administered 2014-12-11: 1 via ORAL
  Filled 2014-12-11: qty 1

## 2014-12-11 NOTE — ED Notes (Addendum)
Patient c/o left wall chest pain. Per patient was holding 100 lb pet pitbull dog Friday night  While girlfriend was removing sutures from docked tail. Patient reports that dog hit him in chest while trying to jump out of arms. Per patient sharp, stabbing pain over heart with deep breath, movement, or cough. Per patient no shortness of breath unless coughs then patient states "It takes my breath away."  Patient reports taking ibuprofen yesterday with no relief.

## 2014-12-11 NOTE — Discharge Instructions (Signed)
Rib Contusion °A rib contusion (bruise) can occur by a blow to the chest or by a fall against a hard object. Usually these will be much better in a couple weeks. If X-rays were taken today and there are no broken bones (fractures), the diagnosis of bruising is made. However, broken ribs may not show up for several days, or may be discovered later on a routine X-ray when signs of healing show up. If this happens to you, it does not mean that something was missed on the X-ray, but simply that it did not show up on the first X-rays. Earlier diagnosis will not usually change the treatment. °HOME CARE INSTRUCTIONS  °· Avoid strenuous activity. Be careful during activities and avoid bumping the injured ribs. Activities that pull on the injured ribs and cause pain should be avoided, if possible. °· For the first day or two, an ice pack used every 20 minutes while awake may be helpful. Put ice in a plastic bag and put a towel between the bag and the skin. °· Eat a normal, well-balanced diet. Drink plenty of fluids to avoid constipation. °· Take deep breaths several times a day to keep lungs free of infection. Try to cough several times a day. Splint the injured area with a pillow while coughing to ease pain. Coughing can help prevent pneumonia. °· Wear a rib belt or binder only if told to do so by your caregiver. If you are wearing a rib belt or binder, you must do the breathing exercises as directed by your caregiver. If not used properly, rib belts or binders restrict breathing which can lead to pneumonia. °· Only take over-the-counter or prescription medicines for pain, discomfort, or fever as directed by your caregiver. °SEEK MEDICAL CARE IF:  °· You or your child has an oral temperature above 102° F (38.9° C). °· Your baby is older than 3 months with a rectal temperature of 100.5° F (38.1° C) or higher for more than 1 day. °· You develop a cough, with thick or bloody sputum. °SEEK IMMEDIATE MEDICAL CARE IF:  °· You  have difficulty breathing. °· You feel sick to your stomach (nausea), have vomiting or belly (abdominal) pain. °· You have worsening pain, not controlled with medications, or there is a change in the location of the pain. °· You develop sweating or radiation of the pain into the arms, jaw or shoulders, or become light headed or faint. °· You or your child has an oral temperature above 102° F (38.9° C), not controlled by medicine. °· Your or your baby is older than 3 months with a rectal temperature of 102° F (38.9° C) or higher. °· Your baby is 3 months old or younger with a rectal temperature of 100.4° F (38° C) or higher. °MAKE SURE YOU:  °· Understand these instructions. °· Will watch your condition. °· Will get help right away if you are not doing well or get worse. °Document Released: 04/09/2001 Document Revised: 11/09/2012 Document Reviewed: 03/02/2008 °[employer]® Patient Information ©2015 [employer]. This information is not intended to replace advice given to you by your health care provider. Make sure you discuss any questions you have with your health care provider. ° °
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
## 2014-12-13 ENCOUNTER — Emergency Department (HOSPITAL_COMMUNITY)
Admission: EM | Admit: 2014-12-13 | Discharge: 2014-12-13 | Disposition: A | Discharge status: Home / Self Care | Payer: MEDICAID | Attending: Emergency Medicine | Admitting: Emergency Medicine

## 2014-12-13 ENCOUNTER — Encounter (HOSPITAL_COMMUNITY): Payer: Self-pay | Admitting: Emergency Medicine

## 2014-12-13 DIAGNOSIS — Z794 Long term (current) use of insulin: Secondary | ICD-10-CM | POA: Insufficient documentation

## 2014-12-13 DIAGNOSIS — Z8659 Personal history of other mental and behavioral disorders: Secondary | ICD-10-CM | POA: Insufficient documentation

## 2014-12-13 DIAGNOSIS — S20212D Contusion of left front wall of thorax, subsequent encounter: Secondary | ICD-10-CM | POA: Insufficient documentation

## 2014-12-13 DIAGNOSIS — Z79899 Other long term (current) drug therapy: Secondary | ICD-10-CM | POA: Insufficient documentation

## 2014-12-13 DIAGNOSIS — E119 Type 2 diabetes mellitus without complications: Secondary | ICD-10-CM | POA: Insufficient documentation

## 2014-12-13 DIAGNOSIS — Z8619 Personal history of other infectious and parasitic diseases: Secondary | ICD-10-CM | POA: Insufficient documentation

## 2014-12-13 DIAGNOSIS — Z8719 Personal history of other diseases of the digestive system: Secondary | ICD-10-CM | POA: Insufficient documentation

## 2014-12-13 DIAGNOSIS — Z720 Tobacco use: Secondary | ICD-10-CM | POA: Insufficient documentation

## 2014-12-13 DIAGNOSIS — R0602 Shortness of breath: Secondary | ICD-10-CM | POA: Insufficient documentation

## 2014-12-13 DIAGNOSIS — X58XXXD Exposure to other specified factors, subsequent encounter: Secondary | ICD-10-CM | POA: Insufficient documentation

## 2014-12-13 DIAGNOSIS — I10 Essential (primary) hypertension: Secondary | ICD-10-CM | POA: Insufficient documentation

## 2014-12-13 DIAGNOSIS — Z8739 Personal history of other diseases of the musculoskeletal system and connective tissue: Secondary | ICD-10-CM | POA: Insufficient documentation

## 2014-12-13 DIAGNOSIS — G8929 Other chronic pain: Secondary | ICD-10-CM | POA: Insufficient documentation

## 2014-12-13 LAB — TROPONIN I

## 2014-12-13 MED ORDER — LIDOCAINE HCL (PF) 2 % IJ SOLN
INTRAMUSCULAR | Status: AC
  Filled 2014-12-13: qty 10
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
## 2014-12-13 NOTE — ED Notes (Signed)
Pt seen here recently for same; c/o left side chest pain worse with movement and inspiration; reports being hit in chest by his 100 pound dog recently and c/o pain ever since; reports taking motrin and pain pill with relief.

## 2014-12-13 NOTE — ED Notes (Signed)
Pt walked out with out d/c instructions. Coat left in room by pt., coat labeled with pt. Sticker and put in EMS locker closet.
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
## 2014-12-13 NOTE — ED Notes (Signed)
PA at bedside.

## 2016-11-27 ENCOUNTER — Emergency Department (HOSPITAL_COMMUNITY)
Admission: EM | Admit: 2016-11-27 | Discharge: 2016-11-27 | Disposition: A | Discharge status: Home / Self Care | Payer: Self-pay | Attending: Emergency Medicine | Admitting: Emergency Medicine

## 2016-11-27 ENCOUNTER — Emergency Department (HOSPITAL_COMMUNITY): Payer: Self-pay

## 2016-11-27 ENCOUNTER — Encounter (HOSPITAL_COMMUNITY): Payer: Self-pay | Admitting: Emergency Medicine

## 2016-11-27 DIAGNOSIS — Y999 Unspecified external cause status: Secondary | ICD-10-CM | POA: Insufficient documentation

## 2016-11-27 DIAGNOSIS — E119 Type 2 diabetes mellitus without complications: Secondary | ICD-10-CM | POA: Insufficient documentation

## 2016-11-27 DIAGNOSIS — F17210 Nicotine dependence, cigarettes, uncomplicated: Secondary | ICD-10-CM | POA: Insufficient documentation

## 2016-11-27 DIAGNOSIS — R109 Unspecified abdominal pain: Secondary | ICD-10-CM

## 2016-11-27 DIAGNOSIS — S39011A Strain of muscle, fascia and tendon of abdomen, initial encounter: Secondary | ICD-10-CM | POA: Insufficient documentation

## 2016-11-27 DIAGNOSIS — Z794 Long term (current) use of insulin: Secondary | ICD-10-CM | POA: Insufficient documentation

## 2016-11-27 DIAGNOSIS — Y939 Activity, unspecified: Secondary | ICD-10-CM | POA: Insufficient documentation

## 2016-11-27 DIAGNOSIS — I10 Essential (primary) hypertension: Secondary | ICD-10-CM | POA: Insufficient documentation

## 2016-11-27 DIAGNOSIS — Y929 Unspecified place or not applicable: Secondary | ICD-10-CM | POA: Insufficient documentation

## 2016-11-27 DIAGNOSIS — X58XXXA Exposure to other specified factors, initial encounter: Secondary | ICD-10-CM | POA: Insufficient documentation

## 2016-11-27 LAB — CBG MONITORING, ED: Glucose-Capillary: 221 mg/dL — ABNORMAL HIGH (ref 65–99)

## 2016-11-27 MED ORDER — DICYCLOMINE HCL 20 MG PO TABS: 20.0000 mg | ORAL_TABLET | Freq: Three times a day (TID) | ORAL | 0 refills | Status: DC

## 2016-11-27 MED ORDER — CYCLOBENZAPRINE HCL 10 MG PO TABS: 10.0000 mg | ORAL_TABLET | Freq: Three times a day (TID) | ORAL | 0 refills | Status: DC

## 2016-11-27 NOTE — Discharge Instructions (Signed)
Your vital signs within normal limits with exception of your blood pressure being slightly elevated at 164/95. The x-ray of your ribs is negative for fracture or dislocation. The x-ray of your chest is also negative for any injury to the lungs, or any evidence of rupture below the ribs demonstrated by free air. Your examination suggests muscle strain. Please rest your abdominal wall muscles is much as possible. Use Flexeril 3 times daily to aid in the healing process. Flexeril may cause drowsiness, please do not drink alcohol, drive a vehicle, operating machinery, or participate in activities requiring concentration when taking this medication.

## 2016-11-27 NOTE — ED Triage Notes (Signed)
Pt c/o pain to right mid to lower side that is worse with any mvoement or breathing. Denies injury. Denies abd or gu sx.
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
## 2017-02-22 ENCOUNTER — Emergency Department (HOSPITAL_COMMUNITY)
Admission: EM | Admit: 2017-02-22 | Discharge: 2017-02-22 | Disposition: A | Discharge status: Home / Self Care | Payer: Self-pay | Attending: Emergency Medicine | Admitting: Emergency Medicine

## 2017-02-22 ENCOUNTER — Encounter (HOSPITAL_COMMUNITY): Payer: Self-pay | Admitting: Emergency Medicine

## 2017-02-22 DIAGNOSIS — M5136 Other intervertebral disc degeneration, lumbar region: Secondary | ICD-10-CM | POA: Insufficient documentation

## 2017-02-22 DIAGNOSIS — G8929 Other chronic pain: Secondary | ICD-10-CM

## 2017-02-22 DIAGNOSIS — M5440 Lumbago with sciatica, unspecified side: Secondary | ICD-10-CM | POA: Insufficient documentation

## 2017-02-22 DIAGNOSIS — F17210 Nicotine dependence, cigarettes, uncomplicated: Secondary | ICD-10-CM | POA: Insufficient documentation

## 2017-02-22 DIAGNOSIS — M62830 Muscle spasm of back: Secondary | ICD-10-CM | POA: Insufficient documentation

## 2017-02-22 DIAGNOSIS — E119 Type 2 diabetes mellitus without complications: Secondary | ICD-10-CM | POA: Insufficient documentation

## 2017-02-22 DIAGNOSIS — Z794 Long term (current) use of insulin: Secondary | ICD-10-CM | POA: Insufficient documentation

## 2017-02-22 DIAGNOSIS — I10 Essential (primary) hypertension: Secondary | ICD-10-CM | POA: Insufficient documentation

## 2017-02-22 DIAGNOSIS — Z79899 Other long term (current) drug therapy: Secondary | ICD-10-CM | POA: Insufficient documentation

## 2017-02-22 MED ORDER — KETOROLAC TROMETHAMINE 60 MG/2ML IM SOLN
60.0000 mg | Freq: Once | INTRAMUSCULAR | Status: AC
Start: 2017-02-22 — End: 2017-02-22
  Administered 2017-02-22: 60 mg via INTRAMUSCULAR
  Filled 2017-02-22: qty 2

## 2017-02-22 MED ORDER — CYCLOBENZAPRINE HCL 10 MG PO TABS: 10.0000 mg | ORAL_TABLET | Freq: Three times a day (TID) | ORAL | 0 refills | Status: DC

## 2017-02-22 MED ORDER — DEXAMETHASONE SODIUM PHOSPHATE 10 MG/ML IJ SOLN
10.0000 mg | Freq: Once | INTRAMUSCULAR | Status: AC
  Administered 2017-02-22: 10 mg via INTRAMUSCULAR
  Filled 2017-02-22: qty 1

## 2017-02-22 MED ORDER — DIAZEPAM 5 MG PO TABS
10.0000 mg | ORAL_TABLET | Freq: Once | ORAL | Status: AC
  Administered 2017-02-22: 10 mg via ORAL
  Filled 2017-02-22: qty 2

## 2017-02-22 MED ORDER — DICLOFENAC SODIUM 75 MG PO TBEC: 75.0000 mg | DELAYED_RELEASE_TABLET | Freq: Two times a day (BID) | ORAL | 0 refills | Status: DC

## 2017-02-22 NOTE — Discharge Instructions (Signed)
Please rest your back is much as possible. Use Flexeril 3 times daily for spasm pain. Use diclofenac 2 times daily with food. May use Tylenol in between the diclofenac doses if needed for pain. The Flexeril may cause drowsiness, please use this medication with caution. Please see the physicians at the [facility] clinic to establish a primary physician and also to seek referral to the appropriate specialist concerning your pain control. [facility]  [address] [place], [place] [address] [phone]  Services [facility] offers a variety of basic health services.  Services include but are not limited to: Blood pressure checks  Heart rate checks  Blood sugar checks  Urine analysis  Rapid strep tests  Pregnancy tests.  Health education and referrals  People needing more complex services will be directed to a physician online. Using these virtual visits, doctors can evaluate and prescribe medicine and treatments. There will be no medication on-site, though [facility] will help patients fill their prescriptions at little to no cost.   For More information please go to: [email or web address]

## 2017-02-22 NOTE — ED Triage Notes (Signed)
Pt reports lower back pain with hx of same.  States worse than usual with no new injury.
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
## 2017-03-18 ENCOUNTER — Encounter: Payer: Self-pay | Admitting: Physician Assistant

## 2017-03-18 ENCOUNTER — Ambulatory Visit: Payer: Self-pay | Admitting: Physician Assistant

## 2017-03-18 VITALS — BP 136/70 | HR 89 | Temp 97.9°F | Ht 71.0 in | Wt 206.5 lb

## 2017-03-18 DIAGNOSIS — F1910 Other psychoactive substance abuse, uncomplicated: Secondary | ICD-10-CM

## 2017-03-18 DIAGNOSIS — Z8719 Personal history of other diseases of the digestive system: Secondary | ICD-10-CM

## 2017-03-18 DIAGNOSIS — F17219 Nicotine dependence, cigarettes, with unspecified nicotine-induced disorders: Secondary | ICD-10-CM

## 2017-03-18 DIAGNOSIS — I10 Essential (primary) hypertension: Secondary | ICD-10-CM

## 2017-03-18 DIAGNOSIS — K0889 Other specified disorders of teeth and supporting structures: Secondary | ICD-10-CM

## 2017-03-18 DIAGNOSIS — D696 Thrombocytopenia, unspecified: Secondary | ICD-10-CM

## 2017-03-18 DIAGNOSIS — Z1159 Encounter for screening for other viral diseases: Secondary | ICD-10-CM

## 2017-03-18 DIAGNOSIS — Z13220 Encounter for screening for lipoid disorders: Secondary | ICD-10-CM

## 2017-03-18 DIAGNOSIS — F39 Unspecified mood [affective] disorder: Secondary | ICD-10-CM

## 2017-03-18 DIAGNOSIS — E119 Type 2 diabetes mellitus without complications: Secondary | ICD-10-CM

## 2017-03-18 LAB — GLUCOSE, POCT (MANUAL RESULT ENTRY): POC GLUCOSE: 270 mg/dL — AB (ref 70–99)

## 2017-03-18 MED ORDER — LISINOPRIL 10 MG PO TABS: 10.0000 mg | ORAL_TABLET | Freq: Every day | ORAL | 1 refills | Status: DC

## 2017-03-18 MED ORDER — DICYCLOMINE HCL 10 MG PO CAPS: 10.0000 mg | ORAL_CAPSULE | Freq: Three times a day (TID) | ORAL | 0 refills | Status: DC

## 2017-03-18 MED ORDER — INSULIN GLARGINE 100 UNIT/ML SOLOSTAR PEN: 10.0000 [IU] | PEN_INJECTOR | Freq: Every day | SUBCUTANEOUS | 99 refills | Status: DC

## 2017-03-18 MED ORDER — AMOXICILLIN 500 MG PO CAPS: 500.0000 mg | ORAL_CAPSULE | Freq: Three times a day (TID) | ORAL | 0 refills | Status: DC

## 2017-03-18 NOTE — Patient Instructions (Signed)
How and Where to Give Subcutaneous Insulin Injections, Adult   People with type 1 diabetes must take insulin since their bodies do not make it. People with type 2 diabetes may require insulin. There are many different types of insulin as well as other injectable diabetes medicines that are meant to be injected into the fat layer under your skin. The type of insulin or injectable diabetes medicine you take may determine how many injections you give yourself and when to take the injections.   Choosing a site for injection Insulin absorption varies from site to site. As with any injectable medication it is best for the insulin to be injected within the same body region. However, do not inject the insulin in the same spot each time. Rotating the spots you give your injections will prevent inflammation or tissue breakdown. There are four main regions that can be used for injections. The regions include the:  Abdomen (preferred region, especially for non-insulin injectable diabetes medicine).  Front and upper outer sides of thighs.  Back of upper arm.  Buttocks.    Using an insulin pen  1. Wash your hands with soap and water. 2. If you are using the "cloudy" insulin, roll the pen between your palms several times or rotate the pen top to bottom several times. 3. Remove the insulin pen cap. 4. Clean the rubber stopper of the cartridge with an alcohol wipe. 5. Remove the protective paper tab from the disposable needle. 6. Screw the needle onto the pen. 7. Remove the outer plastic needle cover. 8. Remove the inner plastic needle cover. 9. Prime the insulin pen by turning the button (dial) to 2 units. Hold the pen with the needle pointing up, and push the dial on the opposite end until a drop of insulin appears at the needle tip. If no insulin appears, repeat this step. 10. Dial the number of units of insulin you will inject. 11. Use an alcohol wipe to clean the area of the body to be  injected. 12. Pinch up 1 inch of skin and hold it. 13. Put the needle straight into the skin (90-degree angle). 14. Push the dial down to push the insulin into the fat tissue. 15. Count to 10 slowly. Then, remove the needle from the fat tissue. 16. Carefully replace the larger outer plastic needle cover over the needle and unscrew the capped needle.   Throwing away supplies  Discard used needles in a puncture proof sharps disposal container. Follow disposal regulations for the area where you live.  Vials and empty disposable pens may be thrown away in the regular trash   This information is not intended to replace advice given to you by your health care provider. Make sure you discuss any questions you have with your health care provider. Document Released: 10/05/2003 Document Revised: 12/21/2015 Document Reviewed: 12/22/2012 Elsevier Interactive Patient Education  2018 [employer]  

## 2017-03-18 NOTE — Progress Notes (Signed)
BP 136/70 (BP Location: Left Arm, Patient Position: Sitting, Cuff Size: Normal)   Pulse 89   Temp 97.9 F (36.6 C)   Ht 5\' 11"  (1.803 m)   Wt 206 lb 8 oz (93.7 kg)   SpO2 97%   BMI 28.80 kg/m    Subjective:    Patient ID: Matthew [name], male    DOB: 03/15/1976, 41 y.o.   MRN: [id number]  HPI: Matthew [name] is a 41 y.o. male presenting on 03/18/2017 for New Patient (Initial Visit) (last seen PCP in 2013. pt states he was in prison and was released April 2018. pt states he was released with 2 months supply of medications. pt is now out of medicines.)   HPI Chief Complaint  Patient presents with  . New Patient (Initial Visit)    last seen PCP in 2013. pt states he was in prison and was released April 2018. pt states he was released with 2 months supply of medications. pt is now out of medicines.      Pt states dx with DM approx age 70.  Pt was on metformin in the past.  He says he was on insulin about 9 years ago.    He says he has been out of all of his medications for 3 months.  He says he has lost 40 pounds since he got out of jail.   Wt in ER visit in May was 200 pounds but pt insists that it was written down incorrectly.  Pt was 200 pounds in 2016.  Pt says he put on a lot of weight in prison.   Pt is not getting any MH treatment at this time.  He says he went one time to [facility] since he got out of jail but he didn't go back due to transportation issues.    Pt c/o tooth pain.   Pt saw dr Illene Labrador in [place], [place] prison for Crohn's.  He was diagnosed with the crohn's while in jail.  Pt says bentyl worked well for it.  He says he took 40mg  tid.    Pt states no hx hep c. He Says his previous tests were false positive  Pt says he has used marijuana and one time cocaine since he got out of prison.   Relevant past medical, surgical, family and social history reviewed and updated as indicated. Interim medical history since our last visit reviewed. Allergies and medications reviewed  and updated.  PREVIOUS MEDICATIONS: (currently taking no meds)  Current Outpatient Prescriptions:  .  acetaminophen (TYLENOL) 500 MG tablet, Take 500 mg by mouth every 6 (six) hours as needed for moderate pain., Disp: , Rfl:  .  cyclobenzaprine (FLEXERIL) 10 MG tablet, Take 1 tablet (10 mg total) by mouth 3 (three) times daily. (Patient not taking: Reported on 03/18/2017), Disp: 20 tablet, Rfl: 0 .  diclofenac (VOLTAREN) 75 MG EC tablet, Take 1 tablet (75 mg total) by mouth 2 (two) times daily. (Patient not taking: Reported on 03/18/2017), Disp: 14 tablet, Rfl: 0 .  dicyclomine (BENTYL) 20 MG tablet, Take 1 tablet (20 mg total) by mouth 3 (three) times daily before meals. (Patient not taking: Reported on 03/18/2017), Disp: 60 tablet, Rfl: 0 .  insulin lispro (HUMALOG) 100 UNIT/ML injection, Inject 20 Units into the skin 2 (two) times daily., Disp: , Rfl:  .  insulin regular (NOVOLIN R) 250 units/2.73mL (100 units/mL) injection, Inject 8 Units into the skin 2 (two) times daily before a meal., Disp: , Rfl:  .  lisinopril (PRINIVIL,ZESTRIL) 40 MG tablet, Take 40 mg by mouth daily., Disp: , Rfl:    Review of Systems  Constitutional: Positive for appetite change, fatigue and unexpected weight change. Negative for chills, diaphoresis and fever.  HENT: Positive for dental problem. Negative for congestion, drooling, ear pain, facial swelling, hearing loss, mouth sores, sneezing, sore throat, trouble swallowing and voice change.   Eyes: Negative for pain, discharge, redness, itching and visual disturbance.  Respiratory: Negative for cough, choking, shortness of breath and wheezing.   Cardiovascular: Negative for chest pain, palpitations and leg swelling.  Gastrointestinal: Positive for abdominal pain, blood in stool and diarrhea. Negative for constipation and vomiting.  Endocrine: Positive for polydipsia. Negative for cold intolerance and heat intolerance.  Genitourinary: Negative for decreased urine  volume, dysuria and hematuria.  Musculoskeletal: Positive for arthralgias and back pain. Negative for gait problem.  Skin: Negative for rash.  Allergic/Immunologic: Negative for environmental allergies.  Neurological: Negative for seizures, syncope, light-headedness and headaches.  Hematological: Negative for adenopathy.  Psychiatric/Behavioral: Positive for dysphoric mood. Negative for agitation and suicidal ideas. The patient is nervous/anxious.     Per HPI unless specifically indicated above     Objective:    BP 136/70 (BP Location: Left Arm, Patient Position: Sitting, Cuff Size: Normal)   Pulse 89   Temp 97.9 F (36.6 C)   Ht 5\' 11"  (1.803 m)   Wt 206 lb 8 oz (93.7 kg)   SpO2 97%   BMI 28.80 kg/m   Wt Readings from Last 3 Encounters:  03/18/17 206 lb 8 oz (93.7 kg)  02/22/17 220 lb (99.8 kg)  12/13/14 200 lb (90.7 kg)    Physical Exam  Constitutional: He is oriented to person, place, and time. He appears well-developed and well-nourished.  HENT:  Head: Normocephalic and atraumatic.  Mouth/Throat: Uvula is midline and oropharynx is clear and moist. No trismus in the jaw. Abnormal dentition. Dental caries present. No dental abscesses or uvula swelling. No oropharyngeal exudate.  Eyes: Pupils are equal, round, and reactive to light. Conjunctivae and EOM are normal.  Neck: Neck supple. No thyromegaly present.  Cardiovascular: Normal rate and regular rhythm.   Pulmonary/Chest: Effort normal and breath sounds normal. He has no wheezes. He has no rales.  Abdominal: Soft. Bowel sounds are normal. He exhibits no mass. There is no hepatosplenomegaly. There is no tenderness.  Musculoskeletal: He exhibits no edema.  Lymphadenopathy:    He has no cervical adenopathy.  Neurological: He is alert and oriented to person, place, and time.  Skin: Skin is warm and dry. No rash noted.  Psychiatric: He has a normal mood and affect. His behavior is normal. Thought content normal.  Vitals  reviewed.   Results for orders placed or performed in visit on 03/18/17  POCT Glucose (CBG)  Result Value Ref Range   POC Glucose 270 (A) 70 - 99 mg/dl      Assessment & Plan:    Encounter Diagnoses  Name Primary?  . Diabetes mellitus without complication (HCC) Yes  . Thrombocytopenia (HCC)   . Need for hepatitis C screening test   . Polysubstance abuse   . History of Crohn's disease   . Essential hypertension   . Dentalgia   . Screening cholesterol level   . Cigarette nicotine dependence with nicotine-induced disorder   . Mood disorder (HCC)      -pt was Given [facility] information to help with transportation -counseled pt that he needs to return to [facility] for his mood disorder -  pt was put on Dental list and given rx for amoxil -pt to get bloodwork today since he is fasting -pt will Start lantus 10u qhs- pt given sample and taught how to use the pens.  He is given reading information.  He is to Monitor fbs. He is to Call office for fbs < 70 or > 300 -pt signed up for medassist- for lisinopril -pt was Given bentyl rx with coupon -record request sent to the prison for colonoscopy report.  Pt may need referral to GI specialist -pt to follow up 1 month.  RTO sooner prn

## 2017-04-15 ENCOUNTER — Ambulatory Visit: Payer: Self-pay | Admitting: Physician Assistant

## 2017-04-15 ENCOUNTER — Other Ambulatory Visit: Payer: Self-pay | Admitting: Physician Assistant

## 2017-04-18 ENCOUNTER — Encounter (HOSPITAL_COMMUNITY): Payer: Self-pay | Admitting: Cardiology

## 2017-04-18 ENCOUNTER — Emergency Department (HOSPITAL_COMMUNITY): Payer: Self-pay

## 2017-04-18 ENCOUNTER — Emergency Department (HOSPITAL_COMMUNITY)
Admission: EM | Admit: 2017-04-18 | Discharge: 2017-04-18 | Disposition: A | Discharge status: Home Health | Payer: Self-pay | Attending: Emergency Medicine | Admitting: Emergency Medicine

## 2017-04-18 DIAGNOSIS — F1410 Cocaine abuse, uncomplicated: Secondary | ICD-10-CM | POA: Insufficient documentation

## 2017-04-18 DIAGNOSIS — Z79899 Other long term (current) drug therapy: Secondary | ICD-10-CM | POA: Insufficient documentation

## 2017-04-18 DIAGNOSIS — F17210 Nicotine dependence, cigarettes, uncomplicated: Secondary | ICD-10-CM | POA: Insufficient documentation

## 2017-04-18 DIAGNOSIS — R0789 Other chest pain: Secondary | ICD-10-CM

## 2017-04-18 DIAGNOSIS — R739 Hyperglycemia, unspecified: Secondary | ICD-10-CM

## 2017-04-18 DIAGNOSIS — E1165 Type 2 diabetes mellitus with hyperglycemia: Secondary | ICD-10-CM | POA: Insufficient documentation

## 2017-04-18 DIAGNOSIS — I10 Essential (primary) hypertension: Secondary | ICD-10-CM | POA: Insufficient documentation

## 2017-04-18 DIAGNOSIS — Z794 Long term (current) use of insulin: Secondary | ICD-10-CM | POA: Insufficient documentation

## 2017-04-18 LAB — URINALYSIS, ROUTINE W REFLEX MICROSCOPIC
BILIRUBIN URINE: NEGATIVE
Bacteria, UA: NONE SEEN
Glucose, UA: >=500 mg/dL — AB
Ketones, ur: 20 mg/dL — AB
Leukocytes, UA: NEGATIVE
NITRITE: NEGATIVE
Protein, ur: NEGATIVE mg/dL
Specific Gravity, Urine: 1.031 — ABNORMAL HIGH (ref 1.005–1.030)
pH: 5 (ref 5.0–8.0)

## 2017-04-18 LAB — HEPATIC FUNCTION PANEL
ALT: 99 U/L — AB (ref 17–63)
AST: 46 U/L — ABNORMAL HIGH (ref 15–41)
Albumin: 4.7 g/dL (ref 3.5–5.0)
Alkaline Phosphatase: 79 U/L (ref 38–126)
BILIRUBIN DIRECT: 0.2 mg/dL (ref 0.1–0.5)
BILIRUBIN INDIRECT: 1.4 mg/dL — AB (ref 0.3–0.9)
Total Bilirubin: 1.6 mg/dL — ABNORMAL HIGH (ref 0.3–1.2)
Total Protein: 8.5 g/dL — ABNORMAL HIGH (ref 6.5–8.1)

## 2017-04-18 LAB — BASIC METABOLIC PANEL
Anion gap: 16 — ABNORMAL HIGH (ref 5–15)
BUN: 14 mg/dL (ref 6–20)
CALCIUM: 9.3 mg/dL (ref 8.9–10.3)
CO2: 22 mmol/L (ref 22–32)
CREATININE: 0.84 mg/dL (ref 0.61–1.24)
Chloride: 94 mmol/L — ABNORMAL LOW (ref 101–111)
GFR calc Af Amer: >60 mL/min (ref >60)
GFR calc non Af Amer: >60 mL/min (ref >60)
GLUCOSE: 711 mg/dL — AB (ref 65–99)
Potassium: 3.5 mmol/L (ref 3.5–5.1)
SODIUM: 132 mmol/L — AB (ref 135–145)

## 2017-04-18 LAB — RAPID URINE DRUG SCREEN, HOSP PERFORMED
Amphetamines: NOT DETECTED
Barbiturates: NOT DETECTED
Benzodiazepines: NOT DETECTED
Cocaine: POSITIVE — AB
OPIATES: NOT DETECTED
TETRAHYDROCANNABINOL: NOT DETECTED

## 2017-04-18 LAB — CBG MONITORING, ED
GLUCOSE-CAPILLARY: 475 mg/dL — AB (ref 65–99)
Glucose-Capillary: 299 mg/dL — ABNORMAL HIGH (ref 65–99)

## 2017-04-18 LAB — CBC
HCT: 47.6 % (ref 39.0–52.0)
Hemoglobin: 17 g/dL (ref 13.0–17.0)
MCH: 29 pg (ref 26.0–34.0)
MCHC: 35.7 g/dL (ref 30.0–36.0)
MCV: 81.2 fL (ref 78.0–100.0)
PLATELETS: 128 10*3/uL — AB (ref 150–400)
RBC: 5.86 MIL/uL — ABNORMAL HIGH (ref 4.22–5.81)
RDW: 13.8 % (ref 11.5–15.5)
WBC: 11.2 10*3/uL — ABNORMAL HIGH (ref 4.0–10.5)

## 2017-04-18 LAB — I-STAT TROPONIN, ED: TROPONIN I, POC: 0.01 ng/mL (ref 0.00–0.08)

## 2017-04-18 LAB — TROPONIN I: Troponin I: <0.03 ng/mL (ref <0.03)

## 2017-04-18 LAB — D-DIMER, QUANTITATIVE (NOT AT ARMC): D DIMER QUANT: 0.37 ug{FEU}/mL (ref 0.00–0.50)

## 2017-04-18 LAB — CK: Total CK: 93 U/L (ref 49–397)

## 2017-04-18 LAB — ETHANOL

## 2017-04-18 MED ORDER — IOPAMIDOL (ISOVUE-370) INJECTION 76%
100.0000 mL | Freq: Once | INTRAVENOUS | Status: AC | PRN
  Administered 2017-04-18: 100 mL via INTRAVENOUS

## 2017-04-18 MED ORDER — INSULIN ASPART 100 UNIT/ML IV SOLN
10.0000 [IU] | Freq: Once | INTRAVENOUS | Status: AC
  Administered 2017-04-18: 10 [IU] via INTRAVENOUS

## 2017-04-18 MED ORDER — IOPAMIDOL (ISOVUE-300) INJECTION 61%: 100.0000 mL | Freq: Once | INTRAVENOUS | Status: DC | PRN

## 2017-04-18 MED ORDER — SODIUM CHLORIDE 0.9 % IV BOLUS (SEPSIS)
1000.0000 mL | Freq: Once | INTRAVENOUS | Status: AC
  Administered 2017-04-18: 1000 mL via INTRAVENOUS

## 2017-04-18 MED ORDER — INSULIN ASPART 100 UNIT/ML ~~LOC~~ SOLN
SUBCUTANEOUS | Status: AC
  Filled 2017-04-18: qty 1
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
## 2017-04-18 NOTE — ED Notes (Signed)
CRITICAL VALUE ALERT  Critical Value:  Glucose 711  Date & Time Notied:  04/18/2017 711  Provider Notified: Ranae Palms  Orders Received/Actions taken: No orders received at this time
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
## 2017-05-02 ENCOUNTER — Emergency Department (HOSPITAL_COMMUNITY)
Admission: EM | Admit: 2017-05-02 | Discharge: 2017-05-04 | Disposition: A | Discharge status: Other Acute Inpatient Hospital | Payer: Self-pay | Attending: Emergency Medicine | Admitting: Emergency Medicine

## 2017-05-02 ENCOUNTER — Encounter (HOSPITAL_COMMUNITY): Payer: Self-pay | Admitting: Emergency Medicine

## 2017-05-02 DIAGNOSIS — F329 Major depressive disorder, single episode, unspecified: Secondary | ICD-10-CM | POA: Insufficient documentation

## 2017-05-02 DIAGNOSIS — Z9114 Patient's other noncompliance with medication regimen: Secondary | ICD-10-CM | POA: Insufficient documentation

## 2017-05-02 DIAGNOSIS — Z046 Encounter for general psychiatric examination, requested by authority: Secondary | ICD-10-CM | POA: Insufficient documentation

## 2017-05-02 DIAGNOSIS — R45851 Suicidal ideations: Secondary | ICD-10-CM | POA: Insufficient documentation

## 2017-05-02 DIAGNOSIS — E1165 Type 2 diabetes mellitus with hyperglycemia: Secondary | ICD-10-CM | POA: Insufficient documentation

## 2017-05-02 DIAGNOSIS — Z794 Long term (current) use of insulin: Secondary | ICD-10-CM | POA: Insufficient documentation

## 2017-05-02 DIAGNOSIS — I10 Essential (primary) hypertension: Secondary | ICD-10-CM | POA: Insufficient documentation

## 2017-05-02 DIAGNOSIS — R739 Hyperglycemia, unspecified: Secondary | ICD-10-CM

## 2017-05-02 DIAGNOSIS — F17210 Nicotine dependence, cigarettes, uncomplicated: Secondary | ICD-10-CM | POA: Insufficient documentation

## 2017-05-02 LAB — RAPID URINE DRUG SCREEN, HOSP PERFORMED
AMPHETAMINES: POSITIVE — AB
BARBITURATES: NOT DETECTED
BENZODIAZEPINES: NOT DETECTED
Cocaine: POSITIVE — AB
Opiates: NOT DETECTED
TETRAHYDROCANNABINOL: NOT DETECTED

## 2017-05-02 LAB — CBC
HEMATOCRIT: 47 % (ref 39.0–52.0)
HEMOGLOBIN: 17 g/dL (ref 13.0–17.0)
MCH: 29.3 pg (ref 26.0–34.0)
MCHC: 36.2 g/dL — ABNORMAL HIGH (ref 30.0–36.0)
MCV: 81 fL (ref 78.0–100.0)
PLATELETS: 149 10*3/uL — AB (ref 150–400)
RBC: 5.8 MIL/uL (ref 4.22–5.81)
RDW: 13.4 % (ref 11.5–15.5)
WBC: 15.2 10*3/uL — AB (ref 4.0–10.5)

## 2017-05-02 LAB — COMPREHENSIVE METABOLIC PANEL
ALBUMIN: 4.4 g/dL (ref 3.5–5.0)
ALT: 55 U/L (ref 17–63)
AST: 21 U/L (ref 15–41)
Alkaline Phosphatase: 77 U/L (ref 38–126)
Anion gap: 19 — ABNORMAL HIGH (ref 5–15)
BUN: 12 mg/dL (ref 6–20)
CHLORIDE: 89 mmol/L — AB (ref 101–111)
CO2: 21 mmol/L — AB (ref 22–32)
CREATININE: 0.91 mg/dL (ref 0.61–1.24)
Calcium: 9.4 mg/dL (ref 8.9–10.3)
GFR calc Af Amer: >60 mL/min (ref >60)
GLUCOSE: 428 mg/dL — AB (ref 65–99)
POTASSIUM: 3.5 mmol/L (ref 3.5–5.1)
Sodium: 129 mmol/L — ABNORMAL LOW (ref 135–145)
Total Bilirubin: 2 mg/dL — ABNORMAL HIGH (ref 0.3–1.2)
Total Protein: 8.4 g/dL — ABNORMAL HIGH (ref 6.5–8.1)

## 2017-05-02 LAB — CBG MONITORING, ED
GLUCOSE-CAPILLARY: 416 mg/dL — AB (ref 65–99)
GLUCOSE-CAPILLARY: 440 mg/dL — AB (ref 65–99)
Glucose-Capillary: 327 mg/dL — ABNORMAL HIGH (ref 65–99)

## 2017-05-02 LAB — BASIC METABOLIC PANEL
Anion gap: 15 (ref 5–15)
BUN: 11 mg/dL (ref 6–20)
CHLORIDE: 93 mmol/L — AB (ref 101–111)
CO2: 23 mmol/L (ref 22–32)
CREATININE: 0.78 mg/dL (ref 0.61–1.24)
Calcium: 8.9 mg/dL (ref 8.9–10.3)
GFR calc Af Amer: >60 mL/min (ref >60)
GLUCOSE: 319 mg/dL — AB (ref 65–99)
POTASSIUM: 3.7 mmol/L (ref 3.5–5.1)
SODIUM: 131 mmol/L — AB (ref 135–145)

## 2017-05-02 LAB — BLOOD GAS, VENOUS
ACID-BASE DEFICIT: 3.5 mmol/L — AB (ref 0.0–2.0)
BICARBONATE: 21.5 mmol/L (ref 20.0–28.0)
FIO2: 0.21
O2 SAT: 95.6 %
PH VEN: 7.357 (ref 7.250–7.430)
Patient temperature: 37
pCO2, Ven: 38.6 mmHg — ABNORMAL LOW (ref 44.0–60.0)
pO2, Ven: 81.9 mmHg — ABNORMAL HIGH (ref 32.0–45.0)

## 2017-05-02 LAB — ETHANOL

## 2017-05-02 LAB — ACETAMINOPHEN LEVEL: Acetaminophen (Tylenol), Serum: <10 ug/mL — ABNORMAL LOW (ref 10–30)

## 2017-05-02 LAB — SALICYLATE LEVEL: Salicylate Lvl: <7 mg/dL (ref 2.8–30.0)

## 2017-05-02 MED ORDER — INSULIN ASPART 100 UNIT/ML ~~LOC~~ SOLN
0.0000 [IU] | Freq: Three times a day (TID) | SUBCUTANEOUS | Status: DC
  Administered 2017-05-02: 11 [IU] via SUBCUTANEOUS
  Administered 2017-05-03 (×2): 15 [IU] via SUBCUTANEOUS
  Administered 2017-05-04: 5 [IU] via SUBCUTANEOUS
  Administered 2017-05-04: 11 [IU] via SUBCUTANEOUS
  Filled 2017-05-02 (×5): qty 1

## 2017-05-02 MED ORDER — INSULIN ASPART 100 UNIT/ML IV SOLN: 8.0000 [IU] | Freq: Once | INTRAVENOUS | Status: DC

## 2017-05-02 MED ORDER — ONDANSETRON HCL 4 MG PO TABS
4.0000 mg | ORAL_TABLET | Freq: Three times a day (TID) | ORAL | Status: DC | PRN
  Administered 2017-05-03: 4 mg via ORAL
  Filled 2017-05-02: qty 1

## 2017-05-02 MED ORDER — IBUPROFEN 400 MG PO TABS
600.0000 mg | ORAL_TABLET | Freq: Three times a day (TID) | ORAL | Status: DC | PRN
  Administered 2017-05-04 (×2): 600 mg via ORAL
  Filled 2017-05-02 (×2): qty 2

## 2017-05-02 MED ORDER — LISINOPRIL 10 MG PO TABS
10.0000 mg | ORAL_TABLET | Freq: Every day | ORAL | Status: DC
  Administered 2017-05-02 – 2017-05-04 (×3): 10 mg via ORAL
  Filled 2017-05-02 (×3): qty 1

## 2017-05-02 MED ORDER — ALUM & MAG HYDROXIDE-SIMETH 200-200-20 MG/5ML PO SUSP: 30.0000 mL | Freq: Four times a day (QID) | ORAL | Status: DC | PRN

## 2017-05-02 MED ORDER — SODIUM CHLORIDE 0.9 % IV BOLUS (SEPSIS)
1000.0000 mL | Freq: Once | INTRAVENOUS | Status: AC
  Administered 2017-05-02: 1000 mL via INTRAVENOUS

## 2017-05-02 MED ORDER — DICYCLOMINE HCL 10 MG PO CAPS
10.0000 mg | ORAL_CAPSULE | Freq: Three times a day (TID) | ORAL | Status: DC
  Administered 2017-05-02 – 2017-05-04 (×6): 10 mg via ORAL
  Filled 2017-05-02 (×5): qty 1

## 2017-05-02 MED ORDER — INSULIN GLARGINE 100 UNIT/ML ~~LOC~~ SOLN
10.0000 [IU] | Freq: Every day | SUBCUTANEOUS | Status: DC
  Administered 2017-05-02 – 2017-05-03 (×2): 10 [IU] via SUBCUTANEOUS
  Filled 2017-05-02 (×3): qty 0.1
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
## 2017-05-02 NOTE — Progress Notes (Signed)
CSW reviewed pt chart. Per Assunta Found, NP, Pt meets criteria for inpatient hospitalization.  Pt referral packet sent to the following hospitals: [facility],  [facility], [facility]  Disposition:  CSW will continue to follow for placement.  Timmothy Euler. Kaylyn Lim, MSW, LCSWA Disposition Clinical Social Work [phone] (cell) [phone] (office)

## 2017-05-02 NOTE — ED Notes (Signed)
TTS done 
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
## 2017-05-03 LAB — CBG MONITORING, ED
Glucose-Capillary: 375 mg/dL — ABNORMAL HIGH (ref 65–99)
Glucose-Capillary: 232 mg/dL — ABNORMAL HIGH (ref 65–99)
Glucose-Capillary: 408 mg/dL — ABNORMAL HIGH (ref 65–99)
Glucose-Capillary: 258 mg/dL — ABNORMAL HIGH (ref 65–99)

## 2017-05-03 NOTE — ED Notes (Signed)
Pts girlfriend bought in personal belongings.  Belongings wanted by security and placed in the room on the shelf.  Belonging are in a red [place] bag.

## 2017-05-03 NOTE — ED Notes (Signed)
Pt repeatedly asking for food. Pt advised that we needed to keep his sugar down.

## 2017-05-03 NOTE — BHH Counselor (Signed)
TTS reassessment: Pt was depressed and sullen during reassessment. He states that he "doesn't know why he didn't just slit his throat yesterday". He states that he just "can't take it anymore and wants to die". Pt denies HI or AVH. He has a long history of IV drug use. Pt blood sugar is out of parameters for admission per [facility] and will need to come down before he can be reviewed at [facility]. RN notified. TTS seeking placement.   [address] [place], LCAS

## 2017-05-03 NOTE — ED Notes (Signed)
CBG 292 

## 2017-05-03 NOTE — ED Notes (Signed)
This RN will call the [facility] and see if we have an no carb foods available here for this pt.

## 2017-05-03 NOTE — ED Notes (Signed)
CBG:375 

## 2017-05-03 NOTE — Progress Notes (Signed)
Patient is being reviewed at [facility]. Writer will continue to follow up.  Melbourne Abts, MSW, LCSWA Clinical social worker in disposition [facility], TTS Office

## 2017-05-03 NOTE — ED Notes (Addendum)
Pt finished eating a snack 15 minutes prior to this RN checking CBG.

## 2017-05-03 NOTE — ED Notes (Signed)
Spoke with [facility], pt will not be able to be placed until his CBG is below 250.

## 2017-05-03 NOTE — ED Notes (Signed)
Pt is diaphoretic and states that his sugar is dropping.  Check sugar and it is 275.  Pt states that he is non compliant and when his sugar drops in the 200s he becomes sick.  Withheld insulin.

## 2017-05-04 ENCOUNTER — Inpatient Hospital Stay (HOSPITAL_COMMUNITY)
Admission: AD | Admit: 2017-05-04 | Discharge: 2017-05-08 | DRG: 885 | Disposition: A | Discharge status: Home / Self Care | Payer: Federal, State, Local not specified - Other | Source: Intra-hospital | Attending: Psychiatry | Admitting: Psychiatry

## 2017-05-04 ENCOUNTER — Encounter (HOSPITAL_COMMUNITY): Payer: Self-pay | Admitting: General Practice

## 2017-05-04 DIAGNOSIS — I10 Essential (primary) hypertension: Secondary | ICD-10-CM | POA: Diagnosis present

## 2017-05-04 DIAGNOSIS — Z653 Problems related to other legal circumstances: Secondary | ICD-10-CM

## 2017-05-04 DIAGNOSIS — F1914 Other psychoactive substance abuse with psychoactive substance-induced mood disorder: Secondary | ICD-10-CM | POA: Diagnosis present

## 2017-05-04 DIAGNOSIS — K5090 Crohn's disease, unspecified, without complications: Secondary | ICD-10-CM | POA: Diagnosis present

## 2017-05-04 DIAGNOSIS — Z91128 Patient's intentional underdosing of medication regimen for other reason: Secondary | ICD-10-CM

## 2017-05-04 DIAGNOSIS — X58XXXA Exposure to other specified factors, initial encounter: Secondary | ICD-10-CM | POA: Diagnosis present

## 2017-05-04 DIAGNOSIS — F1994 Other psychoactive substance use, unspecified with psychoactive substance-induced mood disorder: Secondary | ICD-10-CM | POA: Diagnosis not present

## 2017-05-04 DIAGNOSIS — F419 Anxiety disorder, unspecified: Secondary | ICD-10-CM | POA: Diagnosis present

## 2017-05-04 DIAGNOSIS — Z62810 Personal history of physical and sexual abuse in childhood: Secondary | ICD-10-CM | POA: Diagnosis not present

## 2017-05-04 DIAGNOSIS — M5137 Other intervertebral disc degeneration, lumbosacral region: Secondary | ICD-10-CM | POA: Diagnosis present

## 2017-05-04 DIAGNOSIS — F322 Major depressive disorder, single episode, severe without psychotic features: Principal | ICD-10-CM | POA: Diagnosis present

## 2017-05-04 DIAGNOSIS — Z8619 Personal history of other infectious and parasitic diseases: Secondary | ICD-10-CM | POA: Diagnosis not present

## 2017-05-04 DIAGNOSIS — F17210 Nicotine dependence, cigarettes, uncomplicated: Secondary | ICD-10-CM | POA: Diagnosis present

## 2017-05-04 DIAGNOSIS — T43506A Underdosing of unspecified antipsychotics and neuroleptics, initial encounter: Secondary | ICD-10-CM | POA: Diagnosis present

## 2017-05-04 DIAGNOSIS — Z23 Encounter for immunization: Secondary | ICD-10-CM | POA: Diagnosis not present

## 2017-05-04 DIAGNOSIS — F1410 Cocaine abuse, uncomplicated: Secondary | ICD-10-CM | POA: Diagnosis not present

## 2017-05-04 DIAGNOSIS — E119 Type 2 diabetes mellitus without complications: Secondary | ICD-10-CM | POA: Diagnosis present

## 2017-05-04 DIAGNOSIS — G4700 Insomnia, unspecified: Secondary | ICD-10-CM | POA: Diagnosis not present

## 2017-05-04 HISTORY — DX: Crohn's disease of large intestine without complications: K50.10

## 2017-05-04 LAB — CBG MONITORING, ED
Glucose-Capillary: 247 mg/dL — ABNORMAL HIGH (ref 65–99)
Glucose-Capillary: 323 mg/dL — ABNORMAL HIGH (ref 65–99)

## 2017-05-04 LAB — GLUCOSE, CAPILLARY: GLUCOSE-CAPILLARY: 379 mg/dL — AB (ref 65–99)

## 2017-05-04 MED ORDER — ALUM & MAG HYDROXIDE-SIMETH 200-200-20 MG/5ML PO SUSP: 30.0000 mL | ORAL | Status: DC | PRN

## 2017-05-04 MED ORDER — MAGNESIUM HYDROXIDE 400 MG/5ML PO SUSP
30.0000 mL | Freq: Every day | ORAL | Status: DC | PRN
  Administered 2017-05-05: 30 mL via ORAL
  Filled 2017-05-04: qty 30

## 2017-05-04 MED ORDER — NAPROXEN 500 MG PO TABS
500.0000 mg | ORAL_TABLET | Freq: Two times a day (BID) | ORAL | Status: DC | PRN
  Administered 2017-05-05 – 2017-05-06 (×2): 500 mg via ORAL
  Filled 2017-05-04 (×2): qty 1

## 2017-05-04 MED ORDER — HYDROXYZINE HCL 25 MG PO TABS
25.0000 mg | ORAL_TABLET | Freq: Four times a day (QID) | ORAL | Status: DC | PRN
  Administered 2017-05-07: 25 mg via ORAL
  Filled 2017-05-04: qty 10
  Filled 2017-05-04: qty 1

## 2017-05-04 MED ORDER — TRAZODONE HCL 50 MG PO TABS
50.0000 mg | ORAL_TABLET | Freq: Every evening | ORAL | Status: DC | PRN
Start: 2017-05-04 — End: 2017-05-05
  Administered 2017-05-04 (×2): 50 mg via ORAL
  Filled 2017-05-04 (×6): qty 1

## 2017-05-04 MED ORDER — INFLUENZA VAC SPLIT QUAD 0.5 ML IM SUSY
0.5000 mL | PREFILLED_SYRINGE | INTRAMUSCULAR | Status: DC
  Filled 2017-05-04: qty 0.5

## 2017-05-04 MED ORDER — INSULIN ASPART 100 UNIT/ML ~~LOC~~ SOLN
0.0000 [IU] | Freq: Three times a day (TID) | SUBCUTANEOUS | Status: DC
  Administered 2017-05-05 (×3): 15 [IU] via SUBCUTANEOUS
  Administered 2017-05-06: 8 [IU] via SUBCUTANEOUS
  Administered 2017-05-06: 15 [IU] via SUBCUTANEOUS
  Administered 2017-05-06: 11 [IU] via SUBCUTANEOUS
  Administered 2017-05-07: 15 [IU] via SUBCUTANEOUS
  Administered 2017-05-07: 11 [IU] via SUBCUTANEOUS
  Administered 2017-05-07 – 2017-05-08 (×2): 8 [IU] via SUBCUTANEOUS
  Administered 2017-05-08: 15 [IU] via SUBCUTANEOUS

## 2017-05-04 MED ORDER — DICYCLOMINE HCL 20 MG PO TABS
20.0000 mg | ORAL_TABLET | Freq: Four times a day (QID) | ORAL | Status: DC | PRN
  Administered 2017-05-05 – 2017-05-07 (×6): 20 mg via ORAL
  Filled 2017-05-04 (×6): qty 1

## 2017-05-04 MED ORDER — METHOCARBAMOL 500 MG PO TABS
500.0000 mg | ORAL_TABLET | Freq: Three times a day (TID) | ORAL | Status: DC | PRN
  Administered 2017-05-05 – 2017-05-07 (×3): 500 mg via ORAL
  Filled 2017-05-04 (×3): qty 1

## 2017-05-04 MED ORDER — ONDANSETRON 4 MG PO TBDP
4.0000 mg | ORAL_TABLET | Freq: Four times a day (QID) | ORAL | Status: DC | PRN
  Administered 2017-05-05: 4 mg via ORAL
  Filled 2017-05-04: qty 1

## 2017-05-04 MED ORDER — ENSURE ENLIVE PO LIQD: 237.0000 mL | Freq: Two times a day (BID) | ORAL | Status: DC

## 2017-05-04 MED ORDER — INSULIN GLARGINE 100 UNIT/ML ~~LOC~~ SOLN
10.0000 [IU] | Freq: Every day | SUBCUTANEOUS | Status: DC
  Administered 2017-05-04 – 2017-05-05 (×2): 10 [IU] via SUBCUTANEOUS

## 2017-05-04 MED ORDER — ACETAMINOPHEN 325 MG PO TABS
650.0000 mg | ORAL_TABLET | Freq: Four times a day (QID) | ORAL | Status: DC | PRN
  Administered 2017-05-04 – 2017-05-05 (×2): 650 mg via ORAL
  Filled 2017-05-04 (×2): qty 2

## 2017-05-04 MED ORDER — INSULIN ASPART 100 UNIT/ML ~~LOC~~ SOLN
0.0000 [IU] | Freq: Every day | SUBCUTANEOUS | Status: DC
  Administered 2017-05-04 – 2017-05-05 (×2): 5 [IU] via SUBCUTANEOUS
  Administered 2017-05-06: 4 [IU] via SUBCUTANEOUS
  Administered 2017-05-07: 3 [IU] via SUBCUTANEOUS

## 2017-05-04 MED ORDER — LOPERAMIDE HCL 2 MG PO CAPS: 2.0000 mg | ORAL_CAPSULE | ORAL | Status: DC | PRN

## 2017-05-04 NOTE — ED Notes (Signed)
This RN spoke with the [facility] about Matthew [name], his blood sugar and him "starving to death" Denyse Amass, [facility] is going to the kitchen to see if he can find any no carb foods for him.

## 2017-05-04 NOTE — Progress Notes (Signed)
Nursing Progress Note: 7p-7a D: Pt currently presents with a depressed/anxious/brightens on approach affect and behavior. Pt states "Everything hurts. My heart and my head are tired. I just want to get out of my body. I am tired of where I am at." Interacting minimally with the milieu. Pt reports poor sleep during the previous night with current medication regimen. Pt did not attend wrap-up group.  A: Pt provided with medications per providers orders. Pt's labs and vitals were monitored throughout the night. Pt supported emotionally and encouraged to express concerns and questions. Pt educated on medications.  R: Pt's safety ensured with 15 minute and environmental checks. Pt currently denies SI, HI, and AVH. Pt verbally contracts to seek staff if SI,HI, or AVH occurs and to consult with staff before acting on any harmful thoughts. Will continue to monitor.

## 2017-05-04 NOTE — ED Notes (Signed)
[facility] called transportation on the way 25 minutes away.

## 2017-05-04 NOTE — Tx Team (Signed)
Initial Treatment Plan 05/04/2017 4:43 PM SHYRON WAHLQUIST [id number]    PATIENT STRESSORS: Financial difficulties Medication change or noncompliance Substance abuse   PATIENT STRENGTHS: Capable of independent living General fund of knowledge   PATIENT IDENTIFIED PROBLEMS: depression  Getting my medications straight                   DISCHARGE CRITERIA:  Improved stabilization in mood, thinking, and/or behavior Verbal commitment to aftercare and medication compliance Withdrawal symptoms are absent or subacute and managed without 24-hour nursing intervention  PRELIMINARY DISCHARGE PLAN: Attend 12-step recovery group Outpatient therapy Return to previous living arrangement  PATIENT/FAMILY INVOLVEMENT: This treatment plan has been presented to and reviewed with the patient, Matthew [name].  The patient has been given the opportunity to ask questions and make suggestions.  Vinetta Bergamo Gavino Fouch, RN 05/04/2017, 4:43 PM

## 2017-05-04 NOTE — Progress Notes (Signed)
Patient ID: DAXSON YOKEL, male   DOB: 09-19-1975, 41 y.o.   MRN: [id number]   TREYVAN PUGLIANO is a 41 y.o. male who is admitted as a voluntary patient with intermittent chronic SI. Pt reports that his entire life has been "shitty" and he sees no reason to continue in it. Pt has been in and out of prison for a total time served of about 18 years (non violent crimes). Pt reports continually serving time at a long stretch, getting released and violating his probation. Pt last served 27 months straight and was released 6 months ago. Pt reports being on antidepressants while in prison, but he hasn't been able to afford meds since being out of prison. He is seen at a free clinic but they have changed his medication regimen and also do not manage or supply psychiatric medications. Pt reports that he relapsed on cocaine a couple of months ago and has now graduated to shooting it up instead of snorting it, has been injecting cocaine daily for one week.  Pt also reports recently shooting up meth. Pt says that he had a straight razor to him neck this morning, but doesn't know why he didn't slit his throat. Patient contracts for safety, stating that he will come to staff if he has thoughts of self harm.   Patient was cooperative with assessment.

## 2017-05-04 NOTE — ED Provider Notes (Signed)
Patient accepted by Dr. Jama Flavors to [facility] patient. He has no complaints. Well appearing. Vitals stable. Ed course reviewed. Blood work reassuring. Medically cleared for transfer to [facility] for ongoing psychiatric care.   Lavera Guise, MD 05/04/17 [phone]

## 2017-05-04 NOTE — ED Notes (Signed)
Meal, phone and snacks provided

## 2017-05-04 NOTE — ED Notes (Signed)
Patient accepted at [facility] by Dr. Jama Flavors. Patient may go now. Bed 307-1. Call report to [phone].

## 2017-05-04 NOTE — ED Notes (Signed)
Pt was given a salad which he ate plain and cheese cubes.

## 2017-05-05 DIAGNOSIS — M791 Myalgia, unspecified site: Secondary | ICD-10-CM

## 2017-05-05 DIAGNOSIS — R4587 Impulsiveness: Secondary | ICD-10-CM

## 2017-05-05 DIAGNOSIS — R110 Nausea: Secondary | ICD-10-CM

## 2017-05-05 DIAGNOSIS — R42 Dizziness and giddiness: Secondary | ICD-10-CM

## 2017-05-05 DIAGNOSIS — R4582 Worries: Secondary | ICD-10-CM

## 2017-05-05 DIAGNOSIS — R450 Nervousness: Secondary | ICD-10-CM

## 2017-05-05 DIAGNOSIS — F322 Major depressive disorder, single episode, severe without psychotic features: Principal | ICD-10-CM

## 2017-05-05 DIAGNOSIS — R5381 Other malaise: Secondary | ICD-10-CM

## 2017-05-05 DIAGNOSIS — Z62810 Personal history of physical and sexual abuse in childhood: Secondary | ICD-10-CM

## 2017-05-05 DIAGNOSIS — G4700 Insomnia, unspecified: Secondary | ICD-10-CM

## 2017-05-05 DIAGNOSIS — R5383 Other fatigue: Secondary | ICD-10-CM

## 2017-05-05 DIAGNOSIS — F419 Anxiety disorder, unspecified: Secondary | ICD-10-CM

## 2017-05-05 DIAGNOSIS — F17210 Nicotine dependence, cigarettes, uncomplicated: Secondary | ICD-10-CM

## 2017-05-05 DIAGNOSIS — F1994 Other psychoactive substance use, unspecified with psychoactive substance-induced mood disorder: Secondary | ICD-10-CM

## 2017-05-05 LAB — GLUCOSE, CAPILLARY
GLUCOSE-CAPILLARY: 382 mg/dL — AB (ref 65–99)
Glucose-Capillary: 357 mg/dL — ABNORMAL HIGH (ref 65–99)
Glucose-Capillary: 377 mg/dL — ABNORMAL HIGH (ref 65–99)
Glucose-Capillary: 376 mg/dL — ABNORMAL HIGH (ref 65–99)

## 2017-05-05 LAB — TSH: TSH: 3.165 u[IU]/mL (ref 0.350–4.500)

## 2017-05-05 LAB — LIPID PANEL
Cholesterol: 148 mg/dL (ref 0–200)
HDL: 27 mg/dL — ABNORMAL LOW (ref >40)
LDL CALC: 83 mg/dL (ref 0–99)
Total CHOL/HDL Ratio: 5.5 RATIO
Triglycerides: 191 mg/dL — ABNORMAL HIGH (ref <150)
VLDL: 38 mg/dL (ref 0–40)

## 2017-05-05 LAB — CBG MONITORING, ED: GLUCOSE-CAPILLARY: 293 mg/dL — AB (ref 65–99)

## 2017-05-05 LAB — HEMOGLOBIN A1C
HEMOGLOBIN A1C: 12.2 % — AB (ref 4.8–5.6)
MEAN PLASMA GLUCOSE: 303.44 mg/dL

## 2017-05-05 MED ORDER — GLUCERNA SHAKE PO LIQD: 237.0000 mL | Freq: Three times a day (TID) | ORAL | Status: DC | PRN

## 2017-05-05 MED ORDER — GABAPENTIN 100 MG PO CAPS
100.0000 mg | ORAL_CAPSULE | Freq: Three times a day (TID) | ORAL | Status: DC
  Administered 2017-05-05 – 2017-05-08 (×9): 100 mg via ORAL
  Filled 2017-05-05: qty 21
  Filled 2017-05-05 (×8): qty 1
  Filled 2017-05-05 (×2): qty 21
  Filled 2017-05-05: qty 1
  Filled 2017-05-05 (×2): qty 21
  Filled 2017-05-05 (×2): qty 1

## 2017-05-05 MED ORDER — QUETIAPINE FUMARATE 25 MG PO TABS
25.0000 mg | ORAL_TABLET | Freq: Every day | ORAL | Status: DC
  Administered 2017-05-05 – 2017-05-07 (×3): 25 mg via ORAL
  Filled 2017-05-05: qty 7
  Filled 2017-05-05 (×4): qty 1
  Filled 2017-05-05: qty 7

## 2017-05-05 MED ORDER — ESCITALOPRAM OXALATE 5 MG PO TABS
5.0000 mg | ORAL_TABLET | Freq: Every day | ORAL | Status: DC
  Administered 2017-05-05 – 2017-05-06 (×2): 5 mg via ORAL
  Filled 2017-05-05 (×4): qty 1

## 2017-05-05 MED ORDER — MAGNESIUM CITRATE PO SOLN
1.0000 | Freq: Once | ORAL | Status: AC
  Administered 2017-05-05: 1 via ORAL
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
## 2017-05-05 NOTE — Tx Team (Signed)
Interdisciplinary Treatment and Diagnostic Plan Update 05/05/2017 Time of Session: 9:30am  Matthew [name]  MRN: [id number]  Principal Diagnosis: Mdd,rec,sev Cocaine Use Disorder,sev Amphetamine use disorder,moderate  Secondary Diagnoses: Active Problems:   Severe major depression (HCC)   Current Medications:  Current Facility-Administered Medications  Medication Dose Route Frequency Provider Last Rate Last Dose  . acetaminophen (TYLENOL) tablet 650 mg  650 mg Oral Q6H PRN Lindon Romp A, NP   650 mg at 05/05/17 7591  . alum & mag hydroxide-simeth (MAALOX/MYLANTA) 200-200-20 MG/5ML suspension 30 mL  30 mL Oral Q4H PRN Lindon Romp A, NP      . dicyclomine (BENTYL) tablet 20 mg  20 mg Oral Q6H PRN Lindon Romp A, NP   20 mg at 05/05/17 0823  . feeding supplement (GLUCERNA SHAKE) (GLUCERNA SHAKE) liquid 237 mL  237 mL Oral TID PRN Cobos, Myer Peer, MD      . hydrOXYzine (ATARAX/VISTARIL) tablet 25 mg  25 mg Oral Q6H PRN Rozetta Nunnery, NP      . Influenza vac split quadrivalent PF (FLUARIX) injection 0.5 mL  0.5 mL Intramuscular Tomorrow-1000 Cobos, Fernando A, MD      . insulin aspart (novoLOG) injection 0-15 Units  0-15 Units Subcutaneous TID WC Rozetta Nunnery, NP   15 Units at 05/05/17 [phone]  . insulin aspart (novoLOG) injection 0-5 Units  0-5 Units Subcutaneous QHS Lindon Romp A, NP   5 Units at 05/04/17 2048  . insulin glargine (LANTUS) injection 10 Units  10 Units Subcutaneous Daily Lindon Romp A, NP   10 Units at 05/04/17 2049  . loperamide (IMODIUM) capsule 2-4 mg  2-4 mg Oral PRN Lindon Romp A, NP      . magnesium hydroxide (MILK OF MAGNESIA) suspension 30 mL  30 mL Oral Daily PRN Lindon Romp A, NP      . methocarbamol (ROBAXIN) tablet 500 mg  500 mg Oral Q8H PRN Lindon Romp A, NP      . naproxen (NAPROSYN) tablet 500 mg  500 mg Oral BID PRN Lindon Romp A, NP      . ondansetron (ZOFRAN-ODT) disintegrating tablet 4 mg  4 mg Oral Q6H PRN Lindon Romp A, NP   4 mg at 05/05/17  0823  . traZODone (DESYREL) tablet 50 mg  50 mg Oral QHS,MR X 1 Lindon Romp A, NP   50 mg at 05/04/17 2226    PTA Medications: No prescriptions prior to admission.    Treatment Modalities: Medication Management, Group therapy, Case management,  1 to 1 session with clinician, Psychoeducation, Recreational therapy.  Patient Stressors: Financial difficulties Medication change or noncompliance Substance abuse Patient Strengths: Capable of independent living General fund of knowledge  Physician Treatment Plan for Primary Diagnosis: Mdd,rec,sev Cocaine Use Disorder,sev Amphetamine use disorder,moderate  Long Term Goal(s): Improvement in symptoms so as ready for discharge Short Term Goals: Ability to identify changes in lifestyle to reduce recurrence of condition will improve Ability to verbalize feelings will improve Ability to demonstrate self-control will improve Ability to identify and develop effective coping behaviors will improve Compliance with prescribed medications will improve Ability to identify triggers associated with substance abuse/mental health issues will improve  Medication Management: Evaluate patient's response, side effects, and tolerance of medication regimen.  Therapeutic Interventions: 1 to 1 sessions, Unit Group sessions and Medication administration.  Evaluation of Outcomes: Not Met  Physician Treatment Plan for Secondary Diagnosis: Active Problems:   Severe major depression ([place])  Long Term Goal(s): Improvement in symptoms so  as ready for discharge  Short Term Goals: Ability to identify changes in lifestyle to reduce recurrence of condition will improve Ability to verbalize feelings will improve Ability to demonstrate self-control will improve Ability to identify and develop effective coping behaviors will improve Compliance with prescribed medications will improve Ability to identify triggers associated with substance abuse/mental health issues will  improve  Medication Management: Evaluate patient's response, side effects, and tolerance of medication regimen.  Therapeutic Interventions: 1 to 1 sessions, Unit Group sessions and Medication administration.  Evaluation of Outcomes: Not Met  RN Treatment Plan for Primary Diagnosis: Mdd,rec,sev Cocaine Use Disorder,sev Amphetamine use disorder,moderate  Long Term Goal(s): Knowledge of disease and therapeutic regimen to maintain health will improve  Short Term Goals: Ability to participate in decision making will improve and Compliance with prescribed medications will improve  Medication Management: RN will administer medications as ordered by provider, will assess and evaluate patient's response and provide education to patient for prescribed medication. RN will report any adverse and/or side effects to prescribing provider.  Therapeutic Interventions: 1 on 1 counseling sessions, Psychoeducation, Medication administration, Evaluate responses to treatment, Monitor vital signs and CBGs as ordered, Perform/monitor CIWA, COWS, AIMS and Fall Risk screenings as ordered, Perform wound care treatments as ordered.  Evaluation of Outcomes: Not Met  LCSW Treatment Plan for Primary Diagnosis: Mdd,rec,sev Cocaine Use Disorder,sev Amphetamine use disorder,moderate  Long Term Goal(s): Safe transition to appropriate next level of care at discharge, Engage patient in therapeutic group addressing interpersonal concerns. Short Term Goals: Engage patient in aftercare planning with referrals and resources, Increase ability to appropriately verbalize feelings, Increase emotional regulation, Identify triggers associated with mental health/substance abuse issues and Increase skills for wellness and recovery  Therapeutic Interventions: Assess for all discharge needs, 1 to 1 time with Social worker, Explore available resources and support systems, Assess for adequacy in community support network, Educate family  and significant other(s) on suicide prevention, Complete Psychosocial Assessment, Interpersonal group therapy.  Evaluation of Outcomes: Not Met  Progress in Treatment: Attending groups: Pt is new to milieu, continuing to assess  Participating in groups: Pt is new to milieu, continuing to assess  Taking medication as prescribed: Yes, MD continues to assess for medication changes as needed Toleration medication: Yes, no side effects reported at this time Family/Significant other contact made: No, CSW assessing for appropriate contact Patient understands diagnosis: Continuing to assess Discussing patient identified problems/goals with staff: Yes Medical problems stabilized or resolved: Yes Denies suicidal/homicidal ideation: No, pt recently admitted with SI. Issues/concerns per patient self-inventory: None Other: N/A  New problem(s) identified: None identified at this time.   New Short Term/Long Term Goal(s): None identified at this time.   Discharge Plan or Barriers: CSW still assessing for an appropriate plan.  Reason for Continuation of Hospitalization:  Anxiety  Depression Medication stabilization Suicidal ideation Withdrawal symptoms  Estimated Length of Stay: 3-5 days; Estimated discharge date 05/10/17  Attendees: Patient: 05/05/2017 10:21 AM  Physician: Dr. Parke Poisson 05/05/2017 10:21 AM  Nursing: Trinna Post RN; Patrice, RN 05/05/2017 10:21 AM  RN Care Manager: 05/05/2017 10:21 AM  Social Worker: Matthew Saras, [place] 05/05/2017 10:21 AM  Recreational Therapist:  05/05/2017 10:21 AM  Other: Lindell Spar, NP 05/05/2017 10:21 AM  Other:  05/05/2017 10:21 AM  Other: 05/05/2017 10:21 AM  Scribe for Treatment Team: Georga Kaufmann, MSW,LCSWA 05/05/2017 10:21 AM
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
## 2017-05-05 NOTE — Progress Notes (Signed)
D: Pt presents with a flat affect and depressed mood. Pt stated that he's always depressed. Pt denies any anxiety today. Pt denies SI during assessment. Pt stated "I'm not suicidal yet". Pt verbally contracts for safety. Pt denies any withdrawal symptoms today. Pt c/o nausea and muscle spasms due to Crohns dx. Pt given prn meds for discomfort.  A: Medications reviewed with pt. Medications administered as ordered per MD. Verbal support provided. Pt encouraged to attend groups. 15 minute checks performed for safety.  R: Pt compliant with tx.

## 2017-05-05 NOTE — B[facility] Group Notes (Signed)
LCSW Group Therapy Note   05/05/2017 1:15pm   Type of Therapy and Topic:  Group Therapy:  Overcoming Obstacles   Participation Level: Pt was present for the duration of the group. Pt did not participate in the discussion but did listen attentively as others were sharing.  Jonathon Jordan, MSW, LCSWA 05/05/2017 4:11 PM

## 2017-05-05 NOTE — Progress Notes (Addendum)
NUTRITION ASSESSMENT  Pt identified as at risk on the Malnutrition Screen Tool  INTERVENTION: 1. Encourage consistent meal and snack intake 2. D/c Ensure 3. Glucerna Shake po TID PRN, each supplement provides 220 kcal and 10 grams of protein   NUTRITION DIAGNOSIS: Unintentional weight loss related to sub-optimal intake as evidenced by pt report.   Goal: Pt to meet >/= 90% of their estimated nutrition needs.  Monitor:  PO intake  Assessment:  Pt admitted with depression, SI and polysubstance abuse (cocaine, meth). Pt was eating 100% of meals in ED at [facility]. Pt has been reporting good appetite and feeling very hungry. CBGs have ranged in the 300s. HgbA1c: 12.2. Per chart review, pt has lost 30 lb since 7/28 (14% wt loss x 2.5 months, significant for time frame). Suspect this may be associated with elevated blood sugars.  Height: Ht Readings from Last 1 Encounters:  05/04/17 5' 10.75" (1.797 m)    Weight: Wt Readings from Last 1 Encounters:  05/04/17 190 lb (86.2 kg)    Weight Hx: Wt Readings from Last 10 Encounters:  05/04/17 190 lb (86.2 kg)  05/02/17 200 lb (90.7 kg)  03/18/17 206 lb 8 oz (93.7 kg)  02/22/17 220 lb (99.8 kg)  12/13/14 200 lb (90.7 kg)  12/11/14 200 lb (90.7 kg)  11/10/14 198 lb (89.8 kg)  11/09/14 198 lb (89.8 kg)  09/21/14 217 lb (98.4 kg)  09/20/14 217 lb (98.4 kg)    BMI:  Body mass index is 26.69 kg/m. Pt meets criteria for overweight based on current BMI.  Estimated Nutritional Needs: Kcal: 25-30 kcal/kg Protein: > 1 gram protein/kg Fluid: 1 ml/kcal  Diet Order: Diet Carb Modified Fluid consistency: Thin; Room service appropriate? Yes Pt is also offered choice of unit snacks mid-morning and mid-afternoon.  Pt is eating as desired.   Lab results and medications reviewed.   Tilda Franco, MS, RD, LDN Pager: [phone] After Hours Pager: [phone]

## 2017-05-05 NOTE — Progress Notes (Signed)
Recreation Therapy Notes  Date: 05/05/17 Time: 0930 Location: [facility] Dayroom  Group Topic: Stress Management  Goal Area(s) Addresses:  Patient will verbalize importance of using healthy stress management.  Patient will identify positive emotions associated with healthy stress management.   Intervention: Stress Management  Activity :  Meditation.  LRT introduced the stress management technique of meditation.  LRT played a meditation from the Calm app that explained stress and how we take on and exhibit stress throughout the body.  Patients were to listen and follow along as the meditation played to engage in the technique.  Education:  Stress Management, Discharge Planning.   Education Outcome: Acknowledges edcuation/In group clarification offered/Needs additional education  Clinical Observations/Feedback: Pt did not attend group.    Caroll Rancher, LRT/CTRS         Caroll Rancher A 05/05/2017 12:35 PM

## 2017-05-06 DIAGNOSIS — E119 Type 2 diabetes mellitus without complications: Secondary | ICD-10-CM

## 2017-05-06 DIAGNOSIS — F1410 Cocaine abuse, uncomplicated: Secondary | ICD-10-CM

## 2017-05-06 DIAGNOSIS — I10 Essential (primary) hypertension: Secondary | ICD-10-CM

## 2017-05-06 DIAGNOSIS — F1210 Cannabis abuse, uncomplicated: Secondary | ICD-10-CM

## 2017-05-06 LAB — BASIC METABOLIC PANEL
ANION GAP: 9 (ref 5–15)
BUN: 15 mg/dL (ref 6–20)
CALCIUM: 8.8 mg/dL — AB (ref 8.9–10.3)
CO2: 29 mmol/L (ref 22–32)
Chloride: 95 mmol/L — ABNORMAL LOW (ref 101–111)
Creatinine, Ser: 0.6 mg/dL — ABNORMAL LOW (ref 0.61–1.24)
Glucose, Bld: 412 mg/dL — ABNORMAL HIGH (ref 65–99)
POTASSIUM: 4.3 mmol/L (ref 3.5–5.1)
SODIUM: 133 mmol/L — AB (ref 135–145)

## 2017-05-06 LAB — GLUCOSE, CAPILLARY
GLUCOSE-CAPILLARY: 347 mg/dL — AB (ref 65–99)
GLUCOSE-CAPILLARY: 311 mg/dL — AB (ref 65–99)
Glucose-Capillary: 414 mg/dL — ABNORMAL HIGH (ref 65–99)
Glucose-Capillary: 288 mg/dL — ABNORMAL HIGH (ref 65–99)

## 2017-05-06 MED ORDER — FLUOXETINE HCL 20 MG PO CAPS
20.0000 mg | ORAL_CAPSULE | Freq: Every day | ORAL | Status: DC
  Administered 2017-05-07 – 2017-05-08 (×2): 20 mg via ORAL
  Filled 2017-05-06: qty 1
  Filled 2017-05-06 (×2): qty 7
  Filled 2017-05-06 (×3): qty 1

## 2017-05-06 MED ORDER — INSULIN GLARGINE 100 UNIT/ML ~~LOC~~ SOLN
15.0000 [IU] | Freq: Every day | SUBCUTANEOUS | Status: DC
  Administered 2017-05-06 – 2017-05-07 (×2): 15 [IU] via SUBCUTANEOUS

## 2017-05-06 MED ORDER — INSULIN ASPART 100 UNIT/ML ~~LOC~~ SOLN
5.0000 [IU] | Freq: Three times a day (TID) | SUBCUTANEOUS | Status: DC
  Administered 2017-05-06 – 2017-05-08 (×7): 5 [IU] via SUBCUTANEOUS

## 2017-05-06 NOTE — Progress Notes (Signed)
D: Pt was in the dayroom upon initial approach.  Pt presents with depressed affect and mood.  His goal is to "just get my meds straight so I can get on a level field."  Pt discussed how he was released from jail in April and he needs medication stabilization.  Pt denies SI/HI, denies hallucinations, reports back pain of 8/10, denies withdrawal symptoms.  Pt has been visible in milieu interacting with peers and staff appropriately.  Pt attended evening group.    A: Introduced self to pt.  Actively listened to pt and offered support and encouragement. Medications administered per order.  PRN medication administered for abdominal cramping, pain, and muscle spasms.  Q15 minute safety checks maintained.  R: Pt is safe on the unit.  Pt is compliant with medications.  Pt verbally contracts for safety.  Will continue to monitor and assess.

## 2017-05-06 NOTE — Progress Notes (Signed)
Matthew [name] had been up and visible in milieu this evening, seen interacting appropriately with peers in milieu. He spoke about on-going constipation and spoke about how he has not had a bowel movement in a few days and becoming uncomfortable. He requested and received medications to assist along with other scheduled bedtime medications. A. Support and encouragement provided. R. Safety maintained, will continue to monitor.

## 2017-05-06 NOTE — [facility] Group Notes (Signed)
[facility] Mental Health Association Group Therapy 05/06/2017 1:15pm  Type of Therapy: Mental Health Association Presentation  Participation Level: Active  Participation Quality: Attentive  Affect: Appropriate  Cognitive: Oriented  Insight: Developing/Improving  Engagement in Therapy: Engaged  Modes of Intervention: Discussion, Education and Socialization  Summary of Progress/Problems: Mental Health Association (MHA) Speaker came to talk about his personal journey with mental health. The pt processed ways by which to relate to the speaker. MHA speaker provided handouts and educational information pertaining to groups and services offered by the [facility]. Pt was engaged in speaker's presentation and was receptive to resources provided.    [employer], LCSW 05/06/2017 2:44 PM

## 2017-05-06 NOTE — Progress Notes (Signed)
Inpatient Diabetes Program Recommendations  AACE/ADA: New Consensus Statement on Inpatient Glycemic Control (2015)  Target Ranges:  Prepandial:   less than 140 mg/dL      Peak postprandial:   less than 180 mg/dL (1-2 hours)      Critically ill patients:  140 - 180 mg/dL   Lab Results  Component Value Date   GLUCAP 414 (H) 05/06/2017   HGBA1C 12.2 (H) 05/05/2017    Review of Glycemic Control  Diabetes history: DM2 Outpatient Diabetes medications: None Current orders for Inpatient glycemic control: Lantus 10 units QD, Novolog 5 units tidwc + 0-15 units tidwc and hs  Inpatient Diabetes Program Recommendations:   Increase Lantus to 15 units QD Agree with Novolog 5 units tidwc.  Will need PCP to manage his diabetes. Needs prescription for meter and supplies at d/c.  Discussed with RN.  Will follow daily.  Thank you. Ailene Ards, RD, LDN, CDE Inpatient Diabetes Coordinator [phone]

## 2017-05-06 NOTE — Progress Notes (Signed)
Pt attended AA group this evening.  
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
## 2017-05-06 NOTE — Plan of Care (Signed)
Problem: Safety: Goal: Periods of time without injury will increase Outcome: Progressing Pt has not harmed self or others tonight.  Pt denies SI/HI and verbally contracts for safety.

## 2017-05-06 NOTE — [facility] Suicide Risk Assessment (Signed)
[facility] INPATIENT:  Family/Significant Other Suicide Prevention Education  Suicide Prevention Education:  Patient Refusal for Family/Significant Other Suicide Prevention Education: The patient Matthew [name] has refused to provide written consent for family/significant other to be provided Family/Significant Other Suicide Prevention Education during admission and/or prior to discharge.  Physician notified.  Jonathon Jordan, MSW, LCSWA  05/06/2017, 8:21 AM

## 2017-05-06 NOTE — Progress Notes (Signed)
D: Patient complains of ongoing depression.  He reports some passive SI with no specific plan.  Patient continues to report poor sleep; good appetite; low energy level and poor concentration.  Patient rates his depression as a 9; hopelessness as an 8; anxiety as an 8.  Patient's CBGs have been elevated and a diabetic consult was ordered.  Patient does not appear to be feeling well, however, he did attempt to go to group.  Patient has depressed mood continuously. A: Continue to monitor medication management and MD orders.  Safety checks completed every 15 minutes per protocol.  Offer support and encouragement as needed. R: Patient is receptive to staff; his behavior is appropriate.

## 2017-05-07 LAB — GLUCOSE, CAPILLARY
GLUCOSE-CAPILLARY: 292 mg/dL — AB (ref 65–99)
Glucose-Capillary: 268 mg/dL — ABNORMAL HIGH (ref 65–99)
Glucose-Capillary: 333 mg/dL — ABNORMAL HIGH (ref 65–99)
Glucose-Capillary: 378 mg/dL — ABNORMAL HIGH (ref 65–99)

## 2017-05-07 MED ORDER — DICYCLOMINE HCL 20 MG PO TABS
20.0000 mg | ORAL_TABLET | Freq: Three times a day (TID) | ORAL | Status: DC
  Administered 2017-05-07 – 2017-05-08 (×4): 20 mg via ORAL
  Filled 2017-05-07 (×12): qty 1

## 2017-05-07 MED ORDER — NICOTINE 21 MG/24HR TD PT24
21.0000 mg | MEDICATED_PATCH | Freq: Every day | TRANSDERMAL | Status: DC
  Administered 2017-05-07 – 2017-05-08 (×2): 21 mg via TRANSDERMAL
  Filled 2017-05-07 (×3): qty 1

## 2017-05-07 NOTE — Progress Notes (Signed)
Pt attended NA group this evening.  

## 2017-05-07 NOTE — Progress Notes (Signed)
Patient ID: Matthew [name], male   DOB: 05-Jan-1976, 41 y.o.   MRN: [id number]  Pt currently presents with a flat affect and guarded behavior. Pt mood is irritable. Pt denies any current goals. Main complaint is lower back pain. Pt reports good sleep with current medication regimen.   Pt provided with medications per providers orders. Pt's labs and vitals were monitored throughout the night. Pt given a 1:1 about emotional and mental status. Pt supported and encouraged to express concerns and questions. Pt educated on medications.  Pt's safety ensured with 15 minute and environmental checks. Pt currently denies SI/HI and A/V hallucinations. Pt verbally agrees to seek staff if SI/HI or A/VH occurs and to consult with staff before acting on any harmful thoughts. Will continue POC.

## 2017-05-07 NOTE — Progress Notes (Signed)
Per patient request, CSW called pt's employer Mr. Earlene Plater at [phone] to notify him of pt's hospitalization. Pt will require work note in order to return to work at discharge. CSW also contacted pt's PO [place] at [phone] and informed him of pt's hospitalization. Pt informed of above and made aware that he must contact his PO at discharge to reschedule his appt that was missed.Pt appreciative and verbalized understanding of above.   Trula Slade, MSW, LCSW Clinical Social Worker 05/07/2017 4:00 PM

## 2017-05-07 NOTE — Progress Notes (Signed)
CSW made 2 attempts to contact pt's employer, Mr. Earlene Plater at [phone] [facility]) per patient request to inform him of hospitalization. No answer/no voicemail.   Trula Slade, MSW, LCSW Clinical Social Worker 05/07/2017 1:04 PM
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
## 2017-05-07 NOTE — Progress Notes (Signed)
D: Patient appears brighter, however, states that his depressive symptoms persist.  Patient continues to report poor sleep; good appetite; low energy level and poor concentration.  Patient also reports passive thoughts of self harm with no specific plan.  He rates his depression and anxiety as an 8; hopelessness as a 7.  His goal today is to "get used to my meds."  Patient's bentyl was changed to be taken with his meals.  Patient has attended some groups on the unit. A: Continue to monitor medication management and MD orders.  Safety checks continued every 15 minutes per protocol.  Offer support and encouragement as needed. R: Patient is receptive to staff; his behavior is appropriate and he is pleasant with staff and his peers.

## 2017-05-07 NOTE — Progress Notes (Signed)
Recreation Therapy Notes  Date: 05/07/17 Time: 0930 Location: [address] Dayroom  Group Topic: Stress Management  Goal Area(s) Addresses:  Patient will verbalize importance of using healthy stress management.  Patient will identify positive emotions associated with healthy stress management.   Intervention: Stress Management  Activity :  Guided Imagery.  LRT introduced the stress management technique of guided imagery.  LRT read a script leading patients on a journey floating on a cloud.  Patients were to follow a long as LRT read script to engage in the activity.  Education:  Stress Management, Discharge Planning.   Education Outcome: Acknowledges edcuation/In group clarification offered/Needs additional education  Clinical Observations/Feedback: Pt did not attend group.   Caroll Rancher, LRT/CTRS         Caroll Rancher A 05/07/2017 11:48 AM

## 2017-05-07 NOTE — [facility] Group Notes (Signed)
LCSW Group Therapy Note   05/07/2017 1:15pm   Type of Therapy and Topic:  Group Therapy:  Overcoming Obstacles   Participation Level:  Active   Description of Group:    In this group patients will be encouraged to explore what they see as obstacles to their own wellness and recovery. They will be guided to discuss their thoughts, feelings, and behaviors related to these obstacles. The group will process together ways to cope with barriers, with attention given to specific choices patients can make. Each patient will be challenged to identify changes they are motivated to make in order to overcome their obstacles. This group will be process-oriented, with patients participating in exploration of their own experiences as well as giving and receiving support and challenge from other group members.   Therapeutic Goals: 1. Patient will identify personal and current obstacles as they relate to admission. 2. Patient will identify barriers that currently interfere with their wellness or overcoming obstacles.  3. Patient will identify feelings, thought process and behaviors related to these barriers. 4. Patient will identify two changes they are willing to make to overcome these obstacles:      Summary of Patient Progress   Matthew [name] was attentive and engaged during today's processing group. He shared that his biggest obstacle was "waiting for the medication to work and being patient. The suicidal thoughts are still there and that's frustrating." Matthew [name] continues to demonstrate progress in the group setting with improving insight.    Therapeutic Modalities:   Cognitive Behavioral Therapy Solution Focused Therapy Motivational Interviewing Relapse Prevention Therapy  Ledell Peoples Smart, LCSW 05/07/2017 1:13 PM

## 2017-05-07 NOTE — Plan of Care (Signed)
Problem: Education: Goal: Emotional status will improve Outcome: Progressing Patient's mood has been stable.  He continues to have depressive symptoms, however, he brightens upon approach and is interacting well with peers and staff.

## 2017-05-08 LAB — GLUCOSE, CAPILLARY
Glucose-Capillary: 272 mg/dL — ABNORMAL HIGH (ref 65–99)
Glucose-Capillary: 312 mg/dL — ABNORMAL HIGH (ref 65–99)

## 2017-05-08 MED ORDER — QUETIAPINE FUMARATE 25 MG PO TABS: 25.0000 mg | ORAL_TABLET | Freq: Every day | ORAL | 0 refills | Status: AC

## 2017-05-08 MED ORDER — DICYCLOMINE HCL 20 MG PO TABS: 20.0000 mg | ORAL_TABLET | Freq: Three times a day (TID) | ORAL | 0 refills | Status: DC

## 2017-05-08 MED ORDER — HYDROXYZINE HCL 25 MG PO TABS: 25.0000 mg | ORAL_TABLET | Freq: Four times a day (QID) | ORAL | 0 refills | Status: AC | PRN

## 2017-05-08 MED ORDER — NICOTINE 21 MG/24HR TD PT24: 21.0000 mg | MEDICATED_PATCH | Freq: Every day | TRANSDERMAL | 0 refills | Status: AC

## 2017-05-08 MED ORDER — FLUOXETINE HCL 20 MG PO CAPS: 20.0000 mg | ORAL_CAPSULE | Freq: Every day | ORAL | 0 refills | Status: AC

## 2017-05-08 MED ORDER — INSULIN GLARGINE 100 UNIT/ML ~~LOC~~ SOLN: 15.0000 [IU] | Freq: Every day | SUBCUTANEOUS | 0 refills | Status: DC

## 2017-05-08 MED ORDER — NAPROXEN 500 MG PO TABS: 500.0000 mg | ORAL_TABLET | Freq: Two times a day (BID) | ORAL | Status: DC | PRN

## 2017-05-08 MED ORDER — GABAPENTIN 100 MG PO CAPS: 100.0000 mg | ORAL_CAPSULE | Freq: Three times a day (TID) | ORAL | 0 refills | Status: AC

## 2017-05-08 MED ORDER — DICYCLOMINE HCL 20 MG PO TABS: 20.0000 mg | ORAL_TABLET | Freq: Three times a day (TID) | ORAL | 0 refills | Status: AC
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
## 2017-05-08 NOTE — Progress Notes (Signed)
  [facility] Adult Case Management Discharge Plan :  Will you be returning to the same living situation after discharge:  Yes,  home At discharge, do you have transportation home?: Yes,  [facility] will transport him back to [facility] Do you have the ability to pay for your medications: Yes,  mental health  Release of information consent forms completed and submitted to medical records by CSW.  Patient to Follow up at: Follow-up Information    [facility] Follow up on 05/12/2017.   Why:  Hospital follow-up on Monday at 9:00AM.  Contact information: [address] [place] [address] [phone]           Next level of care provider has access to [facility] Link:no  Safety Planning and Suicide Prevention discussed: Yes,  SPE completed with pt; pt declined to consent to family contact. SPI pamphlet and Mobile Crisis information provided.  Have you used any form of tobacco in the last 30 days? (Cigarettes, Smokeless Tobacco, Cigars, and/or Pipes): Yes  Has patient been referred to the Quitline?: Patient refused referral  Patient has been referred for addiction treatment: Yes  [employer], LCSW 05/08/2017, 10:21 AM

## 2017-05-08 NOTE — Progress Notes (Signed)
Discharge note:  Patient discharged home per MD order.  Patient will follow up with [facility] on 05/12/17.  Patient received prescriptions and samples of medications.  Patient is pleasant and denies any thoughts of self harm. Patient has  Patient states, "I need to get back to work."  He received all personal belongings from his locker and unit.  Patient left ambulatory with [facility] transportation to go to [facility].  He has transportation from [facility].

## 2017-05-08 NOTE — Plan of Care (Signed)
Problem: Coping: Goal: Ability to verbalize frustrations and anger appropriately will improve Outcome: Not Progressing Pt mood is irritable, pt denies any current issues
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
## 2017-06-06 ENCOUNTER — Other Ambulatory Visit: Payer: Self-pay

## 2017-06-06 ENCOUNTER — Inpatient Hospital Stay (HOSPITAL_COMMUNITY)
Admission: EM | Admit: 2017-06-06 | Discharge: 2017-06-11 | DRG: 637 | Disposition: A | Discharge status: Home / Self Care | Attending: Internal Medicine | Admitting: Internal Medicine

## 2017-06-06 ENCOUNTER — Encounter (HOSPITAL_COMMUNITY): Payer: Self-pay | Admitting: *Deleted

## 2017-06-06 DIAGNOSIS — R768 Other specified abnormal immunological findings in serum: Secondary | ICD-10-CM

## 2017-06-06 DIAGNOSIS — F17210 Nicotine dependence, cigarettes, uncomplicated: Secondary | ICD-10-CM | POA: Diagnosis present

## 2017-06-06 DIAGNOSIS — Z6824 Body mass index (BMI) 24.0-24.9, adult: Secondary | ICD-10-CM

## 2017-06-06 DIAGNOSIS — Z91138 Patient's unintentional underdosing of medication regimen for other reason: Secondary | ICD-10-CM

## 2017-06-06 DIAGNOSIS — Z720 Tobacco use: Secondary | ICD-10-CM | POA: Diagnosis present

## 2017-06-06 DIAGNOSIS — K920 Hematemesis: Secondary | ICD-10-CM | POA: Diagnosis not present

## 2017-06-06 DIAGNOSIS — K2970 Gastritis, unspecified, without bleeding: Secondary | ICD-10-CM

## 2017-06-06 DIAGNOSIS — F1910 Other psychoactive substance abuse, uncomplicated: Secondary | ICD-10-CM | POA: Diagnosis present

## 2017-06-06 DIAGNOSIS — K2991 Gastroduodenitis, unspecified, with bleeding: Secondary | ICD-10-CM | POA: Diagnosis present

## 2017-06-06 DIAGNOSIS — K2100 Gastro-esophageal reflux disease with esophagitis, without bleeding: Secondary | ICD-10-CM

## 2017-06-06 DIAGNOSIS — E869 Volume depletion, unspecified: Secondary | ICD-10-CM | POA: Diagnosis present

## 2017-06-06 DIAGNOSIS — E1311 Other specified diabetes mellitus with ketoacidosis with coma: Secondary | ICD-10-CM

## 2017-06-06 DIAGNOSIS — R112 Nausea with vomiting, unspecified: Secondary | ICD-10-CM

## 2017-06-06 DIAGNOSIS — E1110 Type 2 diabetes mellitus with ketoacidosis without coma: Secondary | ICD-10-CM | POA: Diagnosis present

## 2017-06-06 DIAGNOSIS — E1010 Type 1 diabetes mellitus with ketoacidosis without coma: Secondary | ICD-10-CM

## 2017-06-06 DIAGNOSIS — Y92009 Unspecified place in unspecified non-institutional (private) residence as the place of occurrence of the external cause: Secondary | ICD-10-CM

## 2017-06-06 DIAGNOSIS — K3184 Gastroparesis: Secondary | ICD-10-CM | POA: Diagnosis present

## 2017-06-06 DIAGNOSIS — Z794 Long term (current) use of insulin: Secondary | ICD-10-CM

## 2017-06-06 DIAGNOSIS — F1410 Cocaine abuse, uncomplicated: Secondary | ICD-10-CM

## 2017-06-06 DIAGNOSIS — D751 Secondary polycythemia: Secondary | ICD-10-CM | POA: Diagnosis present

## 2017-06-06 DIAGNOSIS — E876 Hypokalemia: Secondary | ICD-10-CM | POA: Diagnosis present

## 2017-06-06 DIAGNOSIS — B182 Chronic viral hepatitis C: Secondary | ICD-10-CM | POA: Diagnosis present

## 2017-06-06 DIAGNOSIS — K5010 Crohn's disease of large intestine without complications: Secondary | ICD-10-CM | POA: Diagnosis present

## 2017-06-06 DIAGNOSIS — D696 Thrombocytopenia, unspecified: Secondary | ICD-10-CM | POA: Diagnosis present

## 2017-06-06 DIAGNOSIS — E1011 Type 1 diabetes mellitus with ketoacidosis with coma: Principal | ICD-10-CM | POA: Diagnosis present

## 2017-06-06 DIAGNOSIS — Z8249 Family history of ischemic heart disease and other diseases of the circulatory system: Secondary | ICD-10-CM

## 2017-06-06 DIAGNOSIS — E1043 Type 1 diabetes mellitus with diabetic autonomic (poly)neuropathy: Secondary | ICD-10-CM | POA: Diagnosis present

## 2017-06-06 DIAGNOSIS — N179 Acute kidney failure, unspecified: Secondary | ICD-10-CM

## 2017-06-06 DIAGNOSIS — K279 Peptic ulcer, site unspecified, unspecified as acute or chronic, without hemorrhage or perforation: Secondary | ICD-10-CM | POA: Diagnosis present

## 2017-06-06 DIAGNOSIS — K226 Gastro-esophageal laceration-hemorrhage syndrome: Secondary | ICD-10-CM | POA: Diagnosis present

## 2017-06-06 DIAGNOSIS — E871 Hypo-osmolality and hyponatremia: Secondary | ICD-10-CM | POA: Diagnosis present

## 2017-06-06 DIAGNOSIS — K2971 Gastritis, unspecified, with bleeding: Secondary | ICD-10-CM | POA: Diagnosis present

## 2017-06-06 DIAGNOSIS — E43 Unspecified severe protein-calorie malnutrition: Secondary | ICD-10-CM | POA: Diagnosis present

## 2017-06-06 DIAGNOSIS — T39395A Adverse effect of other nonsteroidal anti-inflammatory drugs [NSAID], initial encounter: Secondary | ICD-10-CM | POA: Diagnosis present

## 2017-06-06 DIAGNOSIS — F17200 Nicotine dependence, unspecified, uncomplicated: Secondary | ICD-10-CM

## 2017-06-06 DIAGNOSIS — B370 Candidal stomatitis: Secondary | ICD-10-CM | POA: Diagnosis present

## 2017-06-06 DIAGNOSIS — K2990 Gastroduodenitis, unspecified, without bleeding: Secondary | ICD-10-CM

## 2017-06-06 DIAGNOSIS — Z833 Family history of diabetes mellitus: Secondary | ICD-10-CM

## 2017-06-06 DIAGNOSIS — K2980 Duodenitis without bleeding: Secondary | ICD-10-CM | POA: Diagnosis present

## 2017-06-06 DIAGNOSIS — T383X6A Underdosing of insulin and oral hypoglycemic [antidiabetic] drugs, initial encounter: Secondary | ICD-10-CM | POA: Diagnosis present

## 2017-06-06 DIAGNOSIS — I10 Essential (primary) hypertension: Secondary | ICD-10-CM | POA: Diagnosis present

## 2017-06-06 DIAGNOSIS — Z825 Family history of asthma and other chronic lower respiratory diseases: Secondary | ICD-10-CM

## 2017-06-06 DIAGNOSIS — K208 Other esophagitis: Secondary | ICD-10-CM | POA: Diagnosis present

## 2017-06-06 LAB — BASIC METABOLIC PANEL
ANION GAP: 25 — AB (ref 5–15)
ANION GAP: 15 (ref 5–15)
ANION GAP: 13 (ref 5–15)
Anion gap: 23 — ABNORMAL HIGH (ref 5–15)
BUN: 28 mg/dL — ABNORMAL HIGH (ref 6–20)
BUN: 26 mg/dL — ABNORMAL HIGH (ref 6–20)
BUN: 24 mg/dL — ABNORMAL HIGH (ref 6–20)
BUN: 24 mg/dL — ABNORMAL HIGH (ref 6–20)
CALCIUM: 8.6 mg/dL — AB (ref 8.9–10.3)
CALCIUM: 8.6 mg/dL — AB (ref 8.9–10.3)
CALCIUM: 8.3 mg/dL — AB (ref 8.9–10.3)
CALCIUM: 8.3 mg/dL — AB (ref 8.9–10.3)
CHLORIDE: 85 mmol/L — AB (ref 101–111)
CHLORIDE: 90 mmol/L — AB (ref 101–111)
CHLORIDE: 91 mmol/L — AB (ref 101–111)
CO2: 20 mmol/L — ABNORMAL LOW (ref 22–32)
CO2: 20 mmol/L — AB (ref 22–32)
CO2: 24 mmol/L (ref 22–32)
CO2: 26 mmol/L (ref 22–32)
CREATININE: 1.13 mg/dL (ref 0.61–1.24)
CREATININE: 0.87 mg/dL (ref 0.61–1.24)
CREATININE: 0.79 mg/dL (ref 0.61–1.24)
Chloride: 83 mmol/L — ABNORMAL LOW (ref 101–111)
Creatinine, Ser: 1.19 mg/dL (ref 0.61–1.24)
GFR calc non Af Amer: >60 mL/min (ref >60)
GFR calc non Af Amer: >60 mL/min (ref >60)
GFR calc non Af Amer: >60 mL/min (ref >60)
GLUCOSE: 452 mg/dL — AB (ref 65–99)
GLUCOSE: 412 mg/dL — AB (ref 65–99)
GLUCOSE: 272 mg/dL — AB (ref 65–99)
Glucose, Bld: 195 mg/dL — ABNORMAL HIGH (ref 65–99)
POTASSIUM: 3.3 mmol/L — AB (ref 3.5–5.1)
Potassium: 3.3 mmol/L — ABNORMAL LOW (ref 3.5–5.1)
Potassium: 3.3 mmol/L — ABNORMAL LOW (ref 3.5–5.1)
Potassium: 3.2 mmol/L — ABNORMAL LOW (ref 3.5–5.1)
Sodium: 128 mmol/L — ABNORMAL LOW (ref 135–145)
Sodium: 128 mmol/L — ABNORMAL LOW (ref 135–145)
Sodium: 129 mmol/L — ABNORMAL LOW (ref 135–145)
Sodium: 130 mmol/L — ABNORMAL LOW (ref 135–145)

## 2017-06-06 LAB — CBC
HEMATOCRIT: 47.3 % (ref 39.0–52.0)
HEMOGLOBIN: 17.1 g/dL — AB (ref 13.0–17.0)
MCH: 29.9 pg (ref 26.0–34.0)
MCHC: 36.2 g/dL — ABNORMAL HIGH (ref 30.0–36.0)
MCV: 82.7 fL (ref 78.0–100.0)
Platelets: 141 10*3/uL — ABNORMAL LOW (ref 150–400)
RBC: 5.72 MIL/uL (ref 4.22–5.81)
RDW: 12.8 % (ref 11.5–15.5)
WBC: 18.5 10*3/uL — AB (ref 4.0–10.5)

## 2017-06-06 LAB — GLUCOSE, CAPILLARY
GLUCOSE-CAPILLARY: 152 mg/dL — AB (ref 65–99)
GLUCOSE-CAPILLARY: 369 mg/dL — AB (ref 65–99)
GLUCOSE-CAPILLARY: 258 mg/dL — AB (ref 65–99)
Glucose-Capillary: 189 mg/dL — ABNORMAL HIGH (ref 65–99)
Glucose-Capillary: 215 mg/dL — ABNORMAL HIGH (ref 65–99)
Glucose-Capillary: 189 mg/dL — ABNORMAL HIGH (ref 65–99)
Glucose-Capillary: 142 mg/dL — ABNORMAL HIGH (ref 65–99)
Glucose-Capillary: 271 mg/dL — ABNORMAL HIGH (ref 65–99)

## 2017-06-06 LAB — URINALYSIS, ROUTINE W REFLEX MICROSCOPIC
BACTERIA UA: NONE SEEN
BILIRUBIN URINE: NEGATIVE
Ketones, ur: 80 mg/dL — AB
LEUKOCYTES UA: NEGATIVE
NITRITE: NEGATIVE
PROTEIN: NEGATIVE mg/dL
Specific Gravity, Urine: 1.025 (ref 1.005–1.030)
Squamous Epithelial / LPF: NONE SEEN
pH: 5 (ref 5.0–8.0)

## 2017-06-06 LAB — CBG MONITORING, ED
GLUCOSE-CAPILLARY: 318 mg/dL — AB (ref 65–99)
GLUCOSE-CAPILLARY: 398 mg/dL — AB (ref 65–99)
GLUCOSE-CAPILLARY: 548 mg/dL — AB (ref 65–99)
Glucose-Capillary: 400 mg/dL — ABNORMAL HIGH (ref 65–99)
Glucose-Capillary: 338 mg/dL — ABNORMAL HIGH (ref 65–99)
Glucose-Capillary: 232 mg/dL — ABNORMAL HIGH (ref 65–99)

## 2017-06-06 LAB — MRSA PCR SCREENING: MRSA by PCR: NEGATIVE

## 2017-06-06 LAB — HEMOGLOBIN AND HEMATOCRIT, BLOOD
HEMATOCRIT: 45.6 % (ref 39.0–52.0)
HEMOGLOBIN: 16.2 g/dL (ref 13.0–17.0)

## 2017-06-06 MED ORDER — ACETAMINOPHEN 325 MG PO TABS
650.0000 mg | ORAL_TABLET | Freq: Once | ORAL | Status: AC
Start: 2017-06-06 — End: 2017-06-06
  Administered 2017-06-06: 650 mg via ORAL
  Filled 2017-06-06: qty 2

## 2017-06-06 MED ORDER — LACTATED RINGERS IV SOLN
INTRAVENOUS | Status: DC
  Administered 2017-06-06: 03:00:00 via INTRAVENOUS

## 2017-06-06 MED ORDER — FLUOXETINE HCL 20 MG PO CAPS
20.0000 mg | ORAL_CAPSULE | Freq: Every day | ORAL | Status: DC
  Administered 2017-06-06 – 2017-06-11 (×5): 20 mg via ORAL
  Filled 2017-06-06 (×5): qty 1

## 2017-06-06 MED ORDER — POTASSIUM CHLORIDE IN NACL 20-0.9 MEQ/L-% IV SOLN
INTRAVENOUS | Status: DC
  Administered 2017-06-06 – 2017-06-09 (×7): via INTRAVENOUS

## 2017-06-06 MED ORDER — ENOXAPARIN SODIUM 40 MG/0.4ML ~~LOC~~ SOLN
40.0000 mg | SUBCUTANEOUS | Status: DC
  Filled 2017-06-06: qty 0.4

## 2017-06-06 MED ORDER — POTASSIUM CHLORIDE 10 MEQ/100ML IV SOLN
10.0000 meq | INTRAVENOUS | Status: AC
  Administered 2017-06-06 (×4): 10 meq via INTRAVENOUS
  Filled 2017-06-06 (×4): qty 100

## 2017-06-06 MED ORDER — INSULIN ASPART 100 UNIT/ML ~~LOC~~ SOLN
0.0000 [IU] | Freq: Three times a day (TID) | SUBCUTANEOUS | Status: DC
  Administered 2017-06-06: 8 [IU] via SUBCUTANEOUS
  Administered 2017-06-06: 3 [IU] via SUBCUTANEOUS
  Administered 2017-06-07 (×3): 5 [IU] via SUBCUTANEOUS
  Administered 2017-06-08: 8 [IU] via SUBCUTANEOUS
  Administered 2017-06-08 – 2017-06-09 (×2): 11 [IU] via SUBCUTANEOUS
  Administered 2017-06-09 – 2017-06-10 (×3): 5 [IU] via SUBCUTANEOUS
  Administered 2017-06-10 – 2017-06-11 (×2): 3 [IU] via SUBCUTANEOUS
  Administered 2017-06-11: 5 [IU] via SUBCUTANEOUS

## 2017-06-06 MED ORDER — DEXTROSE-NACL 5-0.45 % IV SOLN: INTRAVENOUS | Status: DC

## 2017-06-06 MED ORDER — HYDROXYZINE HCL 25 MG PO TABS: 25.0000 mg | ORAL_TABLET | Freq: Four times a day (QID) | ORAL | Status: DC | PRN

## 2017-06-06 MED ORDER — SODIUM CHLORIDE 0.9 % IV SOLN
8.0000 mg/h | INTRAVENOUS | Status: DC
  Administered 2017-06-06 – 2017-06-07 (×2): 8 mg/h via INTRAVENOUS
  Filled 2017-06-06 (×8): qty 80

## 2017-06-06 MED ORDER — DICYCLOMINE HCL 10 MG PO CAPS
20.0000 mg | ORAL_CAPSULE | Freq: Three times a day (TID) | ORAL | Status: DC
  Administered 2017-06-06 – 2017-06-07 (×3): 20 mg via ORAL
  Filled 2017-06-06 (×13): qty 2

## 2017-06-06 MED ORDER — QUETIAPINE FUMARATE 25 MG PO TABS
25.0000 mg | ORAL_TABLET | Freq: Every day | ORAL | Status: DC
  Administered 2017-06-06 – 2017-06-10 (×2): 25 mg via ORAL
  Filled 2017-06-06 (×7): qty 1

## 2017-06-06 MED ORDER — PRO-STAT SUGAR FREE PO LIQD
30.0000 mL | Freq: Two times a day (BID) | ORAL | Status: DC
  Administered 2017-06-08: 30 mL via ORAL
  Filled 2017-06-06 (×6): qty 30

## 2017-06-06 MED ORDER — GABAPENTIN 100 MG PO CAPS
100.0000 mg | ORAL_CAPSULE | Freq: Three times a day (TID) | ORAL | Status: DC
  Administered 2017-06-06 – 2017-06-11 (×14): 100 mg via ORAL
  Filled 2017-06-06 (×14): qty 1

## 2017-06-06 MED ORDER — LACTATED RINGERS IV BOLUS (SEPSIS)
1000.0000 mL | Freq: Once | INTRAVENOUS | Status: AC
  Administered 2017-06-06: 1000 mL via INTRAVENOUS

## 2017-06-06 MED ORDER — POTASSIUM CHLORIDE 10 MEQ/100ML IV SOLN
10.0000 meq | INTRAVENOUS | Status: AC
  Administered 2017-06-06 (×2): 10 meq via INTRAVENOUS
  Filled 2017-06-06 (×2): qty 100

## 2017-06-06 MED ORDER — SODIUM CHLORIDE 0.9 % IV SOLN
INTRAVENOUS | Status: DC
  Administered 2017-06-06: 3.4 [IU]/h via INTRAVENOUS
  Filled 2017-06-06: qty 1

## 2017-06-06 MED ORDER — INSULIN ASPART 100 UNIT/ML ~~LOC~~ SOLN
0.0000 [IU] | Freq: Every day | SUBCUTANEOUS | Status: DC
  Administered 2017-06-06: 3 [IU] via SUBCUTANEOUS
  Administered 2017-06-07: 2 [IU] via SUBCUTANEOUS
  Administered 2017-06-08: 4 [IU] via SUBCUTANEOUS
  Administered 2017-06-09: 2 [IU] via SUBCUTANEOUS
  Administered 2017-06-10: 3 [IU] via SUBCUTANEOUS

## 2017-06-06 MED ORDER — DEXTROSE-NACL 5-0.45 % IV SOLN
INTRAVENOUS | Status: DC
  Administered 2017-06-06: 08:00:00 via INTRAVENOUS

## 2017-06-06 MED ORDER — SODIUM CHLORIDE 0.9 % IV SOLN: INTRAVENOUS | Status: DC

## 2017-06-06 MED ORDER — PHENOL 1.4 % MT LIQD
1.0000 | OROMUCOSAL | Status: DC | PRN
  Administered 2017-06-06: 1 via OROMUCOSAL
  Filled 2017-06-06 (×2): qty 177

## 2017-06-06 MED ORDER — SODIUM CHLORIDE 0.9 % IV SOLN
INTRAVENOUS | Status: DC
  Administered 2017-06-06: 09:00:00 via INTRAVENOUS

## 2017-06-06 MED ORDER — NICOTINE 21 MG/24HR TD PT24
21.0000 mg | MEDICATED_PATCH | Freq: Every day | TRANSDERMAL | Status: DC
  Administered 2017-06-06 – 2017-06-09 (×4): 21 mg via TRANSDERMAL
  Filled 2017-06-06 (×5): qty 1

## 2017-06-06 MED ORDER — SODIUM CHLORIDE 0.9 % IV SOLN
80.0000 mg | Freq: Once | INTRAVENOUS | Status: AC
  Administered 2017-06-06: 05:00:00 80 mg via INTRAVENOUS
  Filled 2017-06-06: qty 80

## 2017-06-06 MED ORDER — LACTATED RINGERS IV BOLUS (SEPSIS): 2000.0000 mL | Freq: Once | INTRAVENOUS | Status: DC

## 2017-06-06 MED ORDER — SODIUM CHLORIDE 0.9 % IV SOLN
INTRAVENOUS | Status: DC
  Administered 2017-06-06: 3.9 [IU]/h via INTRAVENOUS
  Filled 2017-06-06: qty 1

## 2017-06-06 MED ORDER — ONDANSETRON HCL 4 MG/2ML IJ SOLN
4.0000 mg | Freq: Four times a day (QID) | INTRAMUSCULAR | Status: DC | PRN
  Administered 2017-06-06 – 2017-06-11 (×3): 4 mg via INTRAVENOUS
  Filled 2017-06-06 (×3): qty 2

## 2017-06-06 MED ORDER — INSULIN GLARGINE 100 UNIT/ML ~~LOC~~ SOLN
10.0000 [IU] | Freq: Every day | SUBCUTANEOUS | Status: DC
  Administered 2017-06-06: 10 [IU] via SUBCUTANEOUS
  Filled 2017-06-06 (×4): qty 0.1

## 2017-06-06 MED ORDER — ONDANSETRON HCL 4 MG PO TABS
4.0000 mg | ORAL_TABLET | Freq: Once | ORAL | Status: AC
Start: 2017-06-06 — End: 2017-06-06
  Administered 2017-06-06: 4 mg via ORAL
  Filled 2017-06-06: qty 1

## 2017-06-06 NOTE — Progress Notes (Signed)
PROGRESS NOTE  Matthew [name] NWG:[id number] DOB: 1976-01-14 DOA: 06/06/2017 PCP: Jacquelin Hawking, PA-C  Brief History:  41 year old male with a history of diabetes mellitus type 1, Crohn's colitis, depression, hypertension, hepatitis C, cocaine  abuse presenting with 3-day history of nausea, vomiting, and generalized weakness.  The patient states that he has not taken his insulin for 4-5 days.  He denies any fevers, chills, chest pain, shortness breath, dysuria, hematuria.  He has some epigastric abdominal pain.  The patient was arrested by the police department on a warrant on June 05, 2017.  The patient was brought to the hospital by the police department secondary to his intractable vomiting.  The patient also had 3 episodes of hematemesis on June 05, 2017.  He states that he has been injecting cocaine for the last 2 days and has been injecting for the past many weeks.  He smokes 1 pack/day for the last 25 years.  Denies any recent alcohol use.  At the time of admission, the patient was noted to have serum glucose of 452 with an anion gap of 25 and ketonuria.  He was started on intravenous insulin and IV fluids.  GI was consulted to assist with management of his hematemesis.  Assessment/Plan: DKA, type 1 -patient started on IV insulin with q 1 hour CBG check and q 4 hour BMPs -pt started on aggressive fluid resuscitation -Electrolytes were monitored and repleted -transitioned to [facility] insulin once anion gap closed -diet was advanced once anion gap closed--start clear liquid diet -HbA1C--12.2 on 05/05/17  Hematemesis  -likely Mallory-Weiss tear, but cannot rule out NSAID induced ulcer (pt uses naproxen regularly) -Hemoglobin at baseline -Consult GI -Continue Protonix drip pending GI evaluation   Cocaine abuse/tobacco abuse -Cessation discussed -NicoDerm patch  AKI -Secondary to volume depletion -Continue IV fluids   Leukocytosis -Likely stress demargination -Monitor  off antibiotics -Urinalysis negative for pyuria   Hypokalemia -Replete -Check magnesium  Hyponatremia -Secondary to volume depletion and pseudohyponatremia from hyperglycemia -Continue IV fluids  Thrombocytopenia -Likely due to chronic liver disease from hepatitis C -HIV  -Serum B12 -Hepatitis B surface antigen -Hepatitis C antibody   Disposition Plan:   [facility] in 1-2 days when cleared by GI Family Communication:  No Family at bedside  Consultants:  GI  Code Status:  FULL  DVT Prophylaxis:  SCDs   Procedures: As Listed in Progress Note Above  Antibiotics: None    Subjective: Patient still feels nauseous but he feels thirsty.  No emesis since admission.  He denies any fevers, chills, chest pain, shortness breath, abdominal pain, dysuria, hematuria, diarrhea.  There is no hematochezia or melena.  Objective: Vitals:   06/06/17 0445 06/06/17 0500 06/06/17 0530 06/06/17 0630  BP:  (!) 123/97 (!) 117/95   Pulse: 94 95 94   Resp: 20 19 19    Temp:    98.4 F (36.9 C)  TempSrc:    Oral  SpO2: 96% 96% 96%   Weight:    81.9 kg (180 lb 8.9 oz)  Height:        Intake/Output Summary (Last 24 hours) at 06/06/2017 1119 Last data filed at 06/06/2017 5784 Gross per 24 hour  Intake 2500 ml  Output 950 ml  Net 1550 ml   Weight change:  Exam:   General:  Pt is alert, follows commands appropriately, not in acute distress  HEENT: No icterus, No thrush, No neck mass, [place]/AT  Cardiovascular: RRR, S1/S2, no  rubs, no gallops  Respiratory: CTA bilaterally, no wheezing, no crackles, no rhonchi  Abdomen: Soft/+BS, non tender, non distended, no guarding  Extremities: No edema, No lymphangitis, No petechiae, No rashes, no synovitis   Data Reviewed: I have personally reviewed following labs and imaging studies Basic Metabolic Panel: Recent Labs  Lab 06/06/17 0122 06/06/17 0228 06/06/17 0531 06/06/17 0723  NA 128* 128* 129* 130*  K 3.3* 3.3* 3.3* 3.2*  CL  83* 85* 90* 91*  CO2 20* 20* 24 26  GLUCOSE 452* 412* 272* 195*  BUN 28* 26* 24* 24*  CREATININE 1.19 1.13 0.87 0.79  CALCIUM 8.6* 8.6* 8.3* 8.3*   Liver Function Tests: No results for input(s): AST, ALT, ALKPHOS, BILITOT, PROT, ALBUMIN in the last 168 hours. No results for input(s): LIPASE, AMYLASE in the last 168 hours. No results for input(s): AMMONIA in the last 168 hours. Coagulation Profile: No results for input(s): INR, PROTIME in the last 168 hours. CBC: Recent Labs  Lab 06/06/17 0122  WBC 18.5*  HGB 17.1*  HCT 47.3  MCV 82.7  PLT 141*   Cardiac Enzymes: No results for input(s): CKTOTAL, CKMB, CKMBINDEX, TROPONINI in the last 168 hours. BNP: Invalid input(s): POCBNP CBG: Recent Labs  Lab 06/06/17 0355 06/06/17 0458 06/06/17 0556 06/06/17 0805 06/06/17 0902  GLUCAP 338* 318* 232* 189* 152*   HbA1C: No results for input(s): HGBA1C in the last 72 hours. Urine analysis:    Component Value Date/Time   COLORURINE STRAW (A) 06/06/2017 0235   APPEARANCEUR CLEAR 06/06/2017 0235   LABSPEC 1.025 06/06/2017 0235   PHURINE 5.0 06/06/2017 0235   GLUCOSEU >=500 (A) 06/06/2017 0235   HGBUR MODERATE (A) 06/06/2017 0235   BILIRUBINUR NEGATIVE 06/06/2017 0235   KETONESUR 80 (A) 06/06/2017 0235   PROTEINUR NEGATIVE 06/06/2017 0235   UROBILINOGEN 0.2 05/14/2014 1645   NITRITE NEGATIVE 06/06/2017 0235   LEUKOCYTESUR NEGATIVE 06/06/2017 0235   Sepsis Labs: @LABRCNTIP (procalcitonin:4,lacticidven:4) )No results found for this or any previous visit (from the past 240 hour(s)).   Scheduled Meds: . dicyclomine  20 mg Oral TID AC & HS  . FLUoxetine  20 mg Oral Daily  . gabapentin  100 mg Oral TID  . insulin aspart  0-15 Units Subcutaneous TID WC  . insulin aspart  0-5 Units Subcutaneous QHS  . insulin glargine  10 Units Subcutaneous Daily  . nicotine  21 mg Transdermal Daily  . QUEtiapine  25 mg Oral QHS   Continuous Infusions: . 0.9 % NaCl with KCl 20 mEq / L    .  pantoprozole (PROTONIX) infusion 8 mg/hr (06/06/17 0522)    Procedures/Studies: No results found.  Cierrah Dace, DO  [facility] Pager [phone]  If 7PM-7AM, please contact night-coverage [email or web address] Password [email or web address] 06/06/2017, 11:19 AM   LOS: 0 days
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
## 2017-06-06 NOTE — Care Management (Signed)
CM consulted to check on medications in prison. All and any necessary medications are available and will be supplied to patient.

## 2017-06-06 NOTE — ED Triage Notes (Signed)
Pt arrived by EMS from jail. Reported blood glucose 500+ in jail. Per EMS CBG 428. Was reported pt had been vomiting blood tonight, not seen by EMS.
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
## 2017-06-06 NOTE — Progress Notes (Signed)
Initial Nutrition Assessment  DOCUMENTATION CODES:  Severe malnutrition in context of social or environmental circumstances  INTERVENTION:  Patient reports severe throat pain that is preventing PO intake. Would recommend analgesia of some form to allow patient to eat.   RD ordered colder items for his dinner tray that he will hopefully be able to tolerate better.   Will order 30 mL Prostat BID, each supplement provides 100 kcal and 15 grams of protein.  NUTRITION DIAGNOSIS:  Severe Malnutrition related to social / environmental circumstances (Injecting cocaine for weeks/not taking insulin)  as evidenced by severe muscle loss and loss of >5% bw x 1 month  GOAL:  Patient will meet greater than or equal to 90% of their needs  MONITOR:  PO intake, Supplement acceptance, Diet advancement, Labs, Weight trends  REASON FOR ASSESSMENT:  Malnutrition Screening Tool    ASSESSMENT:  41 y/o male PMHx polysubstance abuse (etoh, tobacco, illicit substances), Hep C, Crohns, DM1, Anxiety/Depression, HTN, IBS. Brought to ED by police department shortly after being taken into custody due to 3 days N/V weakness, epigastric pain, hematemesis.  Reportedly had not taken insulin x4-5 days. Worked up for DKA and AKI. Admitted for management  On RD return, patient is still significantly lethargic. He stated that he had tried to eat something for lunch, but everything he would try to eat "burnt my throat". He says the pain when he tries to eat is quite severe. He says he "needs to have something on my stomach" but is unable 2/2 odynophagia.    He had reported that he had been largely unable to eat for 2-3 days due to his symptoms. He quickly falls asleep after brief arousal to answer a question.   Earlier this morning, he had stated his UBW was 180-190 lbs. His documented weight history shows large amount of fluctuation, his weight of 190 lbs (10/7) from his last admission was documented as being taken by  standing scale. Using admit weight of 180 lbs, he has a clinically significant loss of >5% bw in 1 month  Pt had reported injecting cocaine for the past few weeks. Reasonable to deduce his Severe malnutrition is in social/environmental context.   Given his recent elevated BGs, will not order Boost breeze. Will try SF prostat, 30 Ml bid.   Physical Exam: Severe orbital, temporal wasting. Severe muscle wasting to Clavicular musculature. Lower Body not assessed   Labs: Hyponatremia improving w/ BG, had been 140-220, however last check was >350. K: 3.2. Meds: Insulin, Seroquel, IVF, kcl, protonix,   Recent Labs  Lab 06/06/17 0228 06/06/17 0531 06/06/17 0723  NA 128* 129* 130*  K 3.3* 3.3* 3.2*  CL 85* 90* 91*  CO2 20* 24 26  BUN 26* 24* 24*  CREATININE 1.13 0.87 0.79  CALCIUM 8.6* 8.3* 8.3*  GLUCOSE 412* 272* 195*   NUTRITION - FOCUSED PHYSICAL EXAM:   Most Recent Value  Orbital Region  Severe depletion  Upper Arm Region  Moderate depletion  Thoracic and Lumbar Region  Moderate depletion  Temple Region  Severe depletion  Clavicle Bone Region  Severe depletion  Clavicle and Acromion Bone Region  Moderate depletion  Scapular Bone Region  Unable to assess  Dorsal Hand  Unable to assess  Patellar Region  Unable to assess  Anterior Thigh Region  Unable to assess  Posterior Calf Region  Unable to assess       Diet Order:  Diet clear liquid Room service appropriate? Yes; Fluid consistency: Thin  EDUCATION  NEEDS:  Not appropriate for education at this time  Skin:  Skin Assessment: Reviewed RN Assessment  Last BM:  Unknown  Height:  Ht Readings from Last 1 Encounters:  06/06/17 6' (1.829 m)   Weight:  Wt Readings from Last 1 Encounters:  06/06/17 180 lb 8.9 oz (81.9 kg)   Wt Readings from Last 10 Encounters:  06/06/17 180 lb 8.9 oz (81.9 kg)  05/04/17 190 lb (86.2 kg)  05/02/17 200 lb (90.7 kg)  03/18/17 206 lb 8 oz (93.7 kg)  02/22/17 220 lb (99.8 kg)  12/13/14  200 lb (90.7 kg)  12/11/14 200 lb (90.7 kg)  11/10/14 198 lb (89.8 kg)  11/09/14 198 lb (89.8 kg)  09/21/14 217 lb (98.4 kg)   Ideal Body Weight:  80.91 kg  BMI:  Body mass index is 24.49 kg/m.  Estimated Nutritional Needs:  Kcal:  2050-2300 kcals (25-28 kcal/kg bw) Protein:  100-115 g Pro  (1.2-1.4 g/kg bw) Fluid:  2.1-2.3 L fluid   Christophe LouisNathan Franks RD, LDN, CNSC Clinical Nutrition Pager: [phone] 06/06/2017 3:01 PM

## 2017-06-06 NOTE — Progress Notes (Signed)
Results for Matthew [name], Matthew [name] (MRN [id number]) as of 06/06/2017 09:10  Ref. Range 06/06/2017 02:43 06/06/2017 03:55 06/06/2017 04:58 06/06/2017 05:56 06/06/2017 08:05  Glucose-Capillary Latest Ref Range: 65 - 99 mg/dL 675 (H) 449 (H) 201 (H) 232 (H) 189 (H)  Noted that patient is on IV insulin drip and glucostabilizer. Recommend Lantus 15 units daily (given 2 hours prior to stopping drip),  Novolog SENSITIVE TID & HS if eating and every 4 hours if NPO when drip [place] stopped when patient is ready to transition off drip.   Noted that patient was admitted to jail recently. The insulin used there is usually the cheaper version: 70/30 insulin.  Recommend 70/30 insulin 14 units BID that would be the dosage equal to the Lantus 15 units daily that he takes at home. Will have case management check on patient's need for available medications in the future.   Smith Mince RN BSN CDE Diabetes Coordinator Pager: [phone]  8am-5pm

## 2017-06-06 NOTE — Progress Notes (Signed)
Pt is still extremely lethargic. Will attempt to see later in afternoon.

## 2017-06-07 DIAGNOSIS — F1910 Other psychoactive substance abuse, uncomplicated: Secondary | ICD-10-CM | POA: Diagnosis not present

## 2017-06-07 DIAGNOSIS — R768 Other specified abnormal immunological findings in serum: Secondary | ICD-10-CM | POA: Diagnosis not present

## 2017-06-07 DIAGNOSIS — Z720 Tobacco use: Secondary | ICD-10-CM | POA: Diagnosis not present

## 2017-06-07 DIAGNOSIS — N179 Acute kidney failure, unspecified: Secondary | ICD-10-CM | POA: Diagnosis not present

## 2017-06-07 DIAGNOSIS — E876 Hypokalemia: Secondary | ICD-10-CM | POA: Diagnosis not present

## 2017-06-07 DIAGNOSIS — B370 Candidal stomatitis: Secondary | ICD-10-CM | POA: Diagnosis not present

## 2017-06-07 DIAGNOSIS — K920 Hematemesis: Secondary | ICD-10-CM | POA: Diagnosis not present

## 2017-06-07 DIAGNOSIS — F1410 Cocaine abuse, uncomplicated: Secondary | ICD-10-CM | POA: Diagnosis not present

## 2017-06-07 DIAGNOSIS — K2990 Gastroduodenitis, unspecified, without bleeding: Secondary | ICD-10-CM

## 2017-06-07 DIAGNOSIS — E1311 Other specified diabetes mellitus with ketoacidosis with coma: Secondary | ICD-10-CM | POA: Diagnosis not present

## 2017-06-07 DIAGNOSIS — R112 Nausea with vomiting, unspecified: Secondary | ICD-10-CM

## 2017-06-07 DIAGNOSIS — B182 Chronic viral hepatitis C: Secondary | ICD-10-CM | POA: Diagnosis not present

## 2017-06-07 DIAGNOSIS — D696 Thrombocytopenia, unspecified: Secondary | ICD-10-CM | POA: Diagnosis not present

## 2017-06-07 DIAGNOSIS — K210 Gastro-esophageal reflux disease with esophagitis: Secondary | ICD-10-CM | POA: Diagnosis not present

## 2017-06-07 DIAGNOSIS — K2970 Gastritis, unspecified, without bleeding: Secondary | ICD-10-CM

## 2017-06-07 LAB — GLUCOSE, CAPILLARY
GLUCOSE-CAPILLARY: 229 mg/dL — AB (ref 65–99)
GLUCOSE-CAPILLARY: 202 mg/dL — AB (ref 65–99)
Glucose-Capillary: 226 mg/dL — ABNORMAL HIGH (ref 65–99)
Glucose-Capillary: 238 mg/dL — ABNORMAL HIGH (ref 65–99)

## 2017-06-07 LAB — BASIC METABOLIC PANEL
ANION GAP: 10 (ref 5–15)
BUN: 15 mg/dL (ref 6–20)
CALCIUM: 8 mg/dL — AB (ref 8.9–10.3)
CO2: 28 mmol/L (ref 22–32)
Chloride: 96 mmol/L — ABNORMAL LOW (ref 101–111)
Creatinine, Ser: 0.56 mg/dL — ABNORMAL LOW (ref 0.61–1.24)
GFR calc non Af Amer: >60 mL/min (ref >60)
Glucose, Bld: 223 mg/dL — ABNORMAL HIGH (ref 65–99)
POTASSIUM: 3.6 mmol/L (ref 3.5–5.1)
Sodium: 134 mmol/L — ABNORMAL LOW (ref 135–145)

## 2017-06-07 LAB — HEPATITIS B SURFACE ANTIGEN: HEP B S AG: NEGATIVE

## 2017-06-07 LAB — MAGNESIUM: MAGNESIUM: 2 mg/dL (ref 1.7–2.4)

## 2017-06-07 LAB — CBC
HCT: 45.6 % (ref 39.0–52.0)
HEMOGLOBIN: 15.8 g/dL (ref 13.0–17.0)
MCH: 28.5 pg (ref 26.0–34.0)
MCHC: 34.6 g/dL (ref 30.0–36.0)
MCV: 82.3 fL (ref 78.0–100.0)
Platelets: 103 10*3/uL — ABNORMAL LOW (ref 150–400)
RBC: 5.54 MIL/uL (ref 4.22–5.81)
RDW: 13 % (ref 11.5–15.5)
WBC: 8.9 10*3/uL (ref 4.0–10.5)

## 2017-06-07 LAB — HEPATITIS C ANTIBODY

## 2017-06-07 LAB — HEPATIC FUNCTION PANEL
ALBUMIN: 3.1 g/dL — AB (ref 3.5–5.0)
ALT: 20 U/L (ref 17–63)
AST: 16 U/L (ref 15–41)
Alkaline Phosphatase: 55 U/L (ref 38–126)
BILIRUBIN TOTAL: 1.6 mg/dL — AB (ref 0.3–1.2)
Bilirubin, Direct: 0.2 mg/dL (ref 0.1–0.5)
Indirect Bilirubin: 1.4 mg/dL — ABNORMAL HIGH (ref 0.3–0.9)
Total Protein: 6 g/dL — ABNORMAL LOW (ref 6.5–8.1)

## 2017-06-07 LAB — HIV ANTIBODY (ROUTINE TESTING W REFLEX): HIV SCREEN 4TH GENERATION: NONREACTIVE

## 2017-06-07 LAB — VITAMIN B12: VITAMIN B 12: 716 pg/mL (ref 180–914)

## 2017-06-07 MED ORDER — CLOTRIMAZOLE 10 MG MT TROC
10.0000 mg | Freq: Every day | OROMUCOSAL | Status: DC
  Administered 2017-06-07 – 2017-06-11 (×19): 10 mg via ORAL
  Filled 2017-06-07 (×32): qty 1

## 2017-06-07 MED ORDER — DICYCLOMINE HCL 10 MG PO CAPS
10.0000 mg | ORAL_CAPSULE | Freq: Three times a day (TID) | ORAL | Status: DC
  Administered 2017-06-07 – 2017-06-08 (×4): 10 mg via ORAL
  Filled 2017-06-07 (×13): qty 1

## 2017-06-07 MED ORDER — INSULIN GLARGINE 100 UNIT/ML ~~LOC~~ SOLN
18.0000 [IU] | Freq: Every day | SUBCUTANEOUS | Status: DC
  Administered 2017-06-07 – 2017-06-08 (×2): 18 [IU] via SUBCUTANEOUS
  Filled 2017-06-07 (×3): qty 0.18

## 2017-06-07 NOTE — Progress Notes (Signed)
  Subjective:  Patient complains of sore throat.  Dr. Wandalee Ferdinand has ordered Mycelex troches.  He denies dysphagia nausea vomiting or melena.  He complains of LLQ abdominal pain.  Objective: Blood pressure (!) 131/93, pulse 71, temperature (!) 97.3 F (36.3 C), temperature source Oral, resp. rate (!) 21, height 6' (1.829 m), weight 186 lb 4.6 oz (84.5 kg), SpO2 98 %. Patient is alert and in no acute distress. Oropharyngeal mucosa with whitish coating. Cardiac exam with regular rhythm normal S1 and S2. No murmur or gallop noted. Lungs are clear to auscultation. Abdomen is symmetrical.  Bowel sounds are normal.  On palpation abdomen soft.  He has mild tenderness at LLQ.  No organomegaly or masses. No LE edema or clubbing noted.  Labs/studies Results:  Recent Labs    05-Jul-2017 0122 Jul 05, 2017 1208 06/07/17 0419  WBC 18.5*  --  8.9  HGB 17.1* 16.2 15.8  HCT 47.3 45.6 45.6  PLT 141*  --  103*    BMET  Recent Labs    07/05/17 0531 07/05/2017 0723 06/07/17 0419  NA 129* 130* 134*  K 3.3* 3.2* 3.6  CL 90* 91* 96*  CO2 24 26 28   GLUCOSE 272* 195* 223*  BUN 24* 24* 15  CREATININE 0.87 0.79 0.56*  CALCIUM 8.3* 8.3* 8.0*    LFT  Recent Labs    06/07/17 0841  PROT 6.0*  ALBUMIN 3.1*  AST 16  ALT 20  ALKPHOS 55  BILITOT 1.6*  BILIDIR 0.2  IBILI 1.4*    PT/INR  No results for input(s): LABPROT, INR in the last 72 hours.  Hepatitis Panel  Recent Labs    07/05/17 1149  HEPBSAG Negative  HCVAB >11.0*     Assessment:  #1.  Upper GI bleed most likely secondary to Mallory-Weiss tear but he could have GERD or peptic ulcer disease.  H&H remains normal.  #2.  Oropharyngeal candidiasis.  He has been begun on Mycelex troches.  #3.  DKA.  Hands on IV insulin.  CO2 is now normal.  #4.  History of drug abuse.   Recommendations:  Advance diet to full liquid diet. CBC in a.m. Continue IV pantoprazole for now. Physical gastroduodenoscopy on 06/09/2017.

## 2017-06-07 NOTE — Progress Notes (Signed)
Report called to Charna Busman RN in preparation for transport to [phone] via w/c

## 2017-06-07 NOTE — Progress Notes (Signed)
PROGRESS NOTE  Matthew [name] VWU:[id number] DOB: 07/24/1976 DOA: 06/06/2017 PCP: Jacquelin Hawking, PA-C   Brief History:  41 year old male with a history of diabetes mellitus type 1, Crohn's colitis, depression, hypertension, hepatitis C, cocaine  abuse presenting with 3-day history of nausea, vomiting, and generalized weakness.  The patient states that he has not taken his insulin for 4-5 days.  He denies any fevers, chills, chest pain, shortness breath, dysuria, hematuria.  He has some epigastric abdominal pain.  The patient was arrested by the police department on a warrant on June 05, 2017.  The patient was brought to the hospital by the police department secondary to his intractable vomiting.  The patient also had 3 episodes of hematemesis on June 05, 2017.  He states that he has been injecting cocaine for the last 2 days and has been injecting for the past many weeks.  He smokes 1 pack/day for the last 25 years.  Denies any recent alcohol use.  At the time of admission, the patient was noted to have serum glucose of 452 with an anion gap of 25 and ketonuria.  He was started on intravenous insulin and IV fluids.  GI was consulted to assist with management of his hematemesis.  Assessment/Plan: DKA, type 1 -patient started on IV insulin with q 1 hour CBG check and q 4 hour BMPs -pt started on aggressive fluid resuscitation -Electrolytes were monitored and repleted -transitioned to [facility] insulin once anion gap closed -diet was advanced once anion gap closed--continue clear liquid diet -HbA1C--12.2 on 05/05/17  Hematemesis/Intractible vomiting -likely multifactorial inculding Mallory-Weiss tear,  NSAID induced PUD (pt uses naproxen regularly), exacerbation of underlying gastroparesis, and gastritis/esophagitis -Hemoglobin remains stable--mild drop due to dilution -appreciate GI eval -Continue Protonix drip--defer to GI -he is still having episodes of emesis without frank  blood -continue clear liquids  Cocaine abuse/tobacco abuse -Cessation discussed -NicoDerm patch  Diabetes Mellitus type 1 -increase Lantus to 18 units -continue novolog sliding scale  Oropharyngeal candidiasis -start mycelex troches  AKI -Secondary to volume depletion -Continue IV fluids  -improved  Leukocytosis -Likely stress demargination -Monitor off antibiotics -Urinalysis negative for pyuria  -improved  Hypokalemia -Replete -Check magnesium--2.0  Hyponatremia -Secondary to volume depletion and pseudohyponatremia from hyperglycemia -Continue IV fluids-->improved  Thrombocytopenia -Likely due to chronic liver disease from hepatitis C -HIV --neg -Serum B12--pending -Hepatitis B surface antigen--neg -Hepatitis C antibody--positive  Hep C antibody positive -check Hep C  RNA   Disposition Plan:   [employer] in 1-2 days when cleared by GI Family Communication:  No Family at bedside  Consultants:  GI  Code Status:  FULL  DVT Prophylaxis:  SCDs      Subjective: Patient states that he is still vomiting.  He has intermittent abdominal cramping.  He denies any fevers, chills, chest pain, hematemesis, diarrhea, dysuria, hematuria.  He denies any headache or neck pain.  Objective: Vitals:   06/07/17 0300 06/07/17 0400 06/07/17 0500 06/07/17 0600  BP:  120/77  (!) 131/93  Pulse: 69 66 72 74  Resp: 20 19 (!) 23 20  Temp:  98.3 F (36.8 C)    TempSrc:  Oral    SpO2: 98% 98% 98% 100%  Weight:  84.5 kg (186 lb 4.6 oz)    Height:        Intake/Output Summary (Last 24 hours) at 06/07/2017 0754 Last data filed at 06/07/2017 0600 Gross per 24 hour  Intake 3640.69 ml  Output 2175 ml  Net 1465.69 ml   Weight change: -1.683 kg (-11.4 oz) Exam:   General:  Pt is alert, follows commands appropriately, not in acute distress  HEENT: No icterus, No thrush, No neck mass, [place]/AT  Cardiovascular: RRR, S1/S2, no rubs, no  gallops  Respiratory: CTA bilaterally, no wheezing, no crackles, no rhonchi  Abdomen: Soft/+BS, non tender, non distended, no guarding  Extremities: No edema, No lymphangitis, No petechiae, No rashes, no synovitis   Data Reviewed: I have personally reviewed following labs and imaging studies Basic Metabolic Panel: Recent Labs  Lab 06/06/17 0122 06/06/17 0228 06/06/17 0531 06/06/17 0723 06/07/17 0419  NA 128* 128* 129* 130* 134*  K 3.3* 3.3* 3.3* 3.2* 3.6  CL 83* 85* 90* 91* 96*  CO2 20* 20* 24 26 28   GLUCOSE 452* 412* 272* 195* 223*  BUN 28* 26* 24* 24* 15  CREATININE 1.19 1.13 0.87 0.79 0.56*  CALCIUM 8.6* 8.6* 8.3* 8.3* 8.0*  MG  --   --   --   --  2.0   Liver Function Tests: No results for input(s): AST, ALT, ALKPHOS, BILITOT, PROT, ALBUMIN in the last 168 hours. No results for input(s): LIPASE, AMYLASE in the last 168 hours. No results for input(s): AMMONIA in the last 168 hours. Coagulation Profile: No results for input(s): INR, PROTIME in the last 168 hours. CBC: Recent Labs  Lab 06/06/17 0122 06/06/17 1208 06/07/17 0419  WBC 18.5*  --  8.9  HGB 17.1* 16.2 15.8  HCT 47.3 45.6 45.6  MCV 82.7  --  82.3  PLT 141*  --  103*   Cardiac Enzymes: No results for input(s): CKTOTAL, CKMB, CKMBINDEX, TROPONINI in the last 168 hours. BNP: Invalid input(s): POCBNP CBG: Recent Labs  Lab 06/06/17 1114 06/06/17 1205 06/06/17 1322 06/06/17 1641 06/06/17 2202  GLUCAP 189* 142* 369* 258* 271*   HbA1C: No results for input(s): HGBA1C in the last 72 hours. Urine analysis:    Component Value Date/Time   COLORURINE STRAW (A) 06/06/2017 0235   APPEARANCEUR CLEAR 06/06/2017 0235   LABSPEC 1.025 06/06/2017 0235   PHURINE 5.0 06/06/2017 0235   GLUCOSEU >=500 (A) 06/06/2017 0235   HGBUR MODERATE (A) 06/06/2017 0235   BILIRUBINUR NEGATIVE 06/06/2017 0235   KETONESUR 80 (A) 06/06/2017 0235   PROTEINUR NEGATIVE 06/06/2017 0235   UROBILINOGEN 0.2 05/14/2014 1645    NITRITE NEGATIVE 06/06/2017 0235   LEUKOCYTESUR NEGATIVE 06/06/2017 0235   Sepsis Labs: @LABRCNTIP (procalcitonin:4,lacticidven:4) ) Recent Results (from the past 240 hour(s))  MRSA PCR Screening     Status: None   Collection Time: 06/06/17  6:28 AM  Result Value Ref Range Status   MRSA by PCR NEGATIVE NEGATIVE Final    Comment:        The GeneXpert MRSA Assay (FDA approved for NASAL specimens only), is one component of a comprehensive MRSA colonization surveillance program. It is not intended to diagnose MRSA infection nor to guide or monitor treatment for MRSA infections.      Scheduled Meds: . dicyclomine  20 mg Oral TID AC & HS  . feeding supplement (PRO-STAT SUGAR FREE 64)  30 mL Oral BID  . FLUoxetine  20 mg Oral Daily  . gabapentin  100 mg Oral TID  . insulin aspart  0-15 Units Subcutaneous TID WC  . insulin aspart  0-5 Units Subcutaneous QHS  . insulin glargine  18 Units Subcutaneous Daily  . nicotine  21 mg Transdermal Daily  . QUEtiapine  25 mg Oral  QHS   Continuous Infusions: . 0.9 % NaCl with KCl 20 mEq / L 125 mL/hr at 06/06/17 1956  . pantoprozole (PROTONIX) infusion 8 mg/hr (06/06/17 0522)    Procedures/Studies: No results found.  Aikeem Lilley, DO  [facility] Pager [phone]  If 7PM-7AM, please contact night-coverage [email or web address] Password [email or web address] 06/07/2017, 7:54 AM   LOS: 1 day

## 2017-06-08 DIAGNOSIS — E1311 Other specified diabetes mellitus with ketoacidosis with coma: Secondary | ICD-10-CM | POA: Diagnosis not present

## 2017-06-08 DIAGNOSIS — K2990 Gastroduodenitis, unspecified, without bleeding: Secondary | ICD-10-CM | POA: Diagnosis not present

## 2017-06-08 DIAGNOSIS — K920 Hematemesis: Secondary | ICD-10-CM | POA: Diagnosis not present

## 2017-06-08 DIAGNOSIS — K2970 Gastritis, unspecified, without bleeding: Secondary | ICD-10-CM | POA: Diagnosis not present

## 2017-06-08 DIAGNOSIS — F1410 Cocaine abuse, uncomplicated: Secondary | ICD-10-CM | POA: Diagnosis not present

## 2017-06-08 DIAGNOSIS — N179 Acute kidney failure, unspecified: Secondary | ICD-10-CM | POA: Diagnosis not present

## 2017-06-08 DIAGNOSIS — B182 Chronic viral hepatitis C: Secondary | ICD-10-CM | POA: Diagnosis not present

## 2017-06-08 DIAGNOSIS — K210 Gastro-esophageal reflux disease with esophagitis: Secondary | ICD-10-CM | POA: Diagnosis not present

## 2017-06-08 LAB — CBC
HCT: 46.6 % (ref 39.0–52.0)
Hemoglobin: 16 g/dL (ref 13.0–17.0)
MCH: 29 pg (ref 26.0–34.0)
MCHC: 34.3 g/dL (ref 30.0–36.0)
MCV: 84.6 fL (ref 78.0–100.0)
PLATELETS: 78 10*3/uL — AB (ref 150–400)
RBC: 5.51 MIL/uL (ref 4.22–5.81)
RDW: 12.8 % (ref 11.5–15.5)
WBC: 5.7 10*3/uL (ref 4.0–10.5)

## 2017-06-08 LAB — BASIC METABOLIC PANEL
Anion gap: 5 (ref 5–15)
BUN: 9 mg/dL (ref 6–20)
CALCIUM: 8.4 mg/dL — AB (ref 8.9–10.3)
CHLORIDE: 97 mmol/L — AB (ref 101–111)
CO2: 31 mmol/L (ref 22–32)
CREATININE: 0.54 mg/dL — AB (ref 0.61–1.24)
GFR calc non Af Amer: >60 mL/min (ref >60)
Glucose, Bld: 348 mg/dL — ABNORMAL HIGH (ref 65–99)
Potassium: 3.6 mmol/L (ref 3.5–5.1)
SODIUM: 133 mmol/L — AB (ref 135–145)

## 2017-06-08 LAB — GLUCOSE, CAPILLARY
GLUCOSE-CAPILLARY: 270 mg/dL — AB (ref 65–99)
GLUCOSE-CAPILLARY: 98 mg/dL (ref 65–99)
GLUCOSE-CAPILLARY: 304 mg/dL — AB (ref 65–99)
Glucose-Capillary: 299 mg/dL — ABNORMAL HIGH (ref 65–99)

## 2017-06-08 MED ORDER — DICYCLOMINE HCL 10 MG PO CAPS
20.0000 mg | ORAL_CAPSULE | Freq: Three times a day (TID) | ORAL | Status: DC
Start: 2017-06-08 — End: 2017-06-11
  Administered 2017-06-08 – 2017-06-11 (×11): 20 mg via ORAL
  Filled 2017-06-08 (×11): qty 2

## 2017-06-08 MED ORDER — INSULIN ASPART 100 UNIT/ML ~~LOC~~ SOLN
4.0000 [IU] | Freq: Three times a day (TID) | SUBCUTANEOUS | Status: DC
  Administered 2017-06-08 – 2017-06-11 (×5): 4 [IU] via SUBCUTANEOUS

## 2017-06-08 MED ORDER — INSULIN GLARGINE 100 UNIT/ML ~~LOC~~ SOLN
30.0000 [IU] | Freq: Every day | SUBCUTANEOUS | Status: DC
  Filled 2017-06-08 (×2): qty 0.3

## 2017-06-08 MED ORDER — PANTOPRAZOLE SODIUM 40 MG IV SOLR
40.0000 mg | Freq: Two times a day (BID) | INTRAVENOUS | Status: DC
  Administered 2017-06-08 – 2017-06-09 (×3): 40 mg via INTRAVENOUS
  Filled 2017-06-08 (×3): qty 40

## 2017-06-08 NOTE — Progress Notes (Signed)
  Subjective:  Patient feels better.  He is not having any difficulty with full liquids.  He also denies dysphagia melena or rectal bleeding.  He complains of epigastric and LLQ abdominal pain.  He says left-sided pain has gotten worse because dicyclomine dose was decreased.  Objective: Blood pressure 115/73, pulse 63, temperature (!) 97.5 F (36.4 C), temperature source Oral, resp. rate 16, height 6' (1.829 m), weight 186 lb 4.6 oz (84.5 kg), SpO2 98 %. Patient is alert and in no acute distress. Abdomen is symmetrical and soft with mild tenderness in mid epigastrium and LLQ.  No organomegaly or masses.  Labs/studies Results:   Recent Labs    06/06/17 0122 06/06/17 1208 06/07/17 0419 06/08/17 0719  WBC 18.5*  --  8.9 5.7  HGB 17.1* 16.2 15.8 16.0  HCT 47.3 45.6 45.6 46.6  PLT 141*  --  103* 78*    BMET   Recent Labs    06/06/17 0723 06/07/17 0419 06/08/17 0719  NA 130* 134* 133*  K 3.2* 3.6 3.6  CL 91* 96* 97*  CO2 26 28 31   GLUCOSE 195* 223* 348*  BUN 24* 15 9  CREATININE 0.79 0.56* 0.54*  CALCIUM 8.3* 8.0* 8.4*    LFT   Recent Labs    06/07/17 0841  PROT 6.0*  ALBUMIN 3.1*  AST 16  ALT 20  ALKPHOS 55  BILITOT 1.6*  BILIDIR 0.2  IBILI 1.4*    PT/INR  No results for input(s): LABPROT, INR in the last 72 hours.  Hepatitis Panel   Recent Labs    06/06/17 1149  HEPBSAG Negative  HCVAB >11.0*     Assessment:  #1. UGI Bleed in a patient with DKA.  Significant drop in H&H could appear to be due to hydration.  He is not bleeding anymore.  Differential diagnosis of peptic ulcer disease Mallory-Weiss tear or GERD.  #2.  Oropharyngeal candidiasis.  He is doing better with Mycelex troches.  #3.  Diabetes mellitus.  Patient had DKA on admission.  He is not in DKA anymore.  Glucose levels are fluctuating.  He is getting insulin coverage with longer acting insulin at bedtime and mealtime coverage.  #4.  Positive HCV antibody.  Transaminases are normal.   Bilirubin is mildly elevated and it is predominately indirect.  He could have Gilbert's syndrome.  HCVRNA by PCR is pending.  #5.  History of IBS.  He did not tolerate low-dose dicyclomine.   Recommendations:  Change pantoprazole to 40 mg IV every 12 hours. Increase dicyclomine dose back to 20 mg before meals and nightly. Diagnostic esophagogastroduodenoscopy under monitored anesthesia care in a.m.

## 2017-06-08 NOTE — Progress Notes (Signed)
PROGRESS NOTE  Matthew [name]:[id number] DOB: November 26, 1975 DOA: 06/06/2017 PCP: Jacquelin Hawking, PA-C  Brief History: 41 year old male with a history of diabetes mellitus type 1, Crohn's colitis, depression, hypertension, hepatitis C, cocaine abuse presenting with 3-day history of nausea, vomiting, and generalized weakness. The patient states that he has not taken his insulin for 4-5 days. He denies any fevers, chills, chest pain, shortness breath, dysuria, hematuria. He has some epigastric abdominal pain. The patient was arrested by the police department on a warrant on June 05, 2017. The patient was brought to the hospital by the police department secondary to his intractable vomiting. The patient also had 3 episodes of hematemesis on June 05, 2017. He states that he has been injecting cocaine for the last 2 days and has been injectingfor the past many weeks. He smokes 1 pack/day for the last 25 years. Denies any recent alcohol use. At the time of admission, the patient was noted to have serum glucose of 452 with an anion gap of 25 and ketonuria. He was started on intravenous insulin and IV fluids. GI was consulted to assist with management of his hematemesis.  Assessment/Plan: DKA, type 1 -patient started on IV insulin with q 1 hour CBG check and q 4 hour BMPs -pt started on aggressive fluid resuscitation -Electrolytes were monitored and repleted -transitioned to [facility] insulin once anion gap closed -diet was advanced once anion gap closed--continue clear liquid diet -HbA1C--12.2 on 05/05/17  Hematemesis/Intractible vomiting -likely multifactorial inculding Mallory-Weiss tear,  NSAID inducedPUD (pt uses naproxen regularly), exacerbation of underlying gastroparesis, and gastritis/esophagitis -Hemoglobin remains stable--mild drop due to dilution -appreciate GI eval>>>EGD planned 11/12 -Continue Protonix drip>>>protonix q 12 -resolved -advanced to full  liquids  Cocaine abuse/tobacco abuse -Cessation discussed -NicoDerm patch  Diabetes Mellitus type 1 -increase Lantus to 30 units -continue novolog sliding scale -add novolog 4 units with meals  Oropharyngeal candidiasis -start mycelex troches  AKI -Secondary to volume depletion -Continue IV fluids  -improved -am BMP  Leukocytosis -Likely stress demargination -Monitor off antibiotics -Urinalysis negative for pyuria  -improved  Hypokalemia -Repleted -Check magnesium--2.0  Hyponatremia -Secondary to volume depletion and pseudohyponatremia from hyperglycemia -Continue IV fluids-->improved  Thrombocytopenia -Likely due to chronic liver disease from hepatitis C -HIV--neg -Serum B12--pending -Hepatitis B surface antigen--neg -Hepatitis C antibody--positive  Hep C antibody positive -check Hep C  RNA--pending   Disposition Plan:[employer] 11/12 or 11/13 if cleared by GI Family Communication:NoFamily at bedside  Consultants:GI  Code Status: FULL  DVT Prophylaxis: SCDs     Subjective: The patient is tolerating full liquids.  He is not having any vomiting or diarrhea.  He is still having some burning when he swallows.  He denies any chest pain, shortness breath, vomiting, diarrhea, abdominal pain.  Objective: Vitals:   06/07/17 2322 06/08/17 0322 06/08/17 1127 06/08/17 1500  BP: 105/61 115/73  115/71  Pulse: 72 63  67  Resp: 16 16  16   Temp: 98.3 F (36.8 C) (!) 97.5 F (36.4 C)  98.2 F (36.8 C)  TempSrc: Oral Oral  Oral  SpO2: 97% 100% 98%   Weight:      Height:        Intake/Output Summary (Last 24 hours) at 06/08/2017 1652 Last data filed at 06/08/2017 1515 Gross per 24 hour  Intake 960 ml  Output 3325 ml  Net -2365 ml   Weight change:  Exam:   General:  Pt is alert, follows commands appropriately,  not in acute distress  HEENT: No icterus, No thrush, No neck mass, [place]/AT  Cardiovascular: RRR, S1/S2, no  rubs, no gallops  Respiratory: CTA bilaterally, no wheezing, no crackles, no rhonchi  Abdomen: Soft/+BS, non tender, non distended, no guarding  Extremities: No edema, No lymphangitis, No petechiae, No rashes, no synovitis   Data Reviewed: I have personally reviewed following labs and imaging studies Basic Metabolic Panel: Recent Labs  Lab 06/06/17 0228 06/06/17 0531 06/06/17 0723 06/07/17 0419 06/08/17 0719  NA 128* 129* 130* 134* 133*  K 3.3* 3.3* 3.2* 3.6 3.6  CL 85* 90* 91* 96* 97*  CO2 20* 24 26 28 31   GLUCOSE 412* 272* 195* 223* 348*  BUN 26* 24* 24* 15 9  CREATININE 1.13 0.87 0.79 0.56* 0.54*  CALCIUM 8.6* 8.3* 8.3* 8.0* 8.4*  MG  --   --   --  2.0  --    Liver Function Tests: Recent Labs  Lab 06/07/17 0841  AST 16  ALT 20  ALKPHOS 55  BILITOT 1.6*  PROT 6.0*  ALBUMIN 3.1*   No results for input(s): LIPASE, AMYLASE in the last 168 hours. No results for input(s): AMMONIA in the last 168 hours. Coagulation Profile: No results for input(s): INR, PROTIME in the last 168 hours. CBC: Recent Labs  Lab 06/06/17 0122 06/06/17 1208 06/07/17 0419 06/08/17 0719  WBC 18.5*  --  8.9 5.7  HGB 17.1* 16.2 15.8 16.0  HCT 47.3 45.6 45.6 46.6  MCV 82.7  --  82.3 84.6  PLT 141*  --  103* 78*   Cardiac Enzymes: No results for input(s): CKTOTAL, CKMB, CKMBINDEX, TROPONINI in the last 168 hours. BNP: Invalid input(s): POCBNP CBG: Recent Labs  Lab 06/07/17 1651 06/07/17 1956 06/08/17 0720 06/08/17 1122 06/08/17 1622  GLUCAP 202* 238* 299* 270* 98   HbA1C: No results for input(s): HGBA1C in the last 72 hours. Urine analysis:    Component Value Date/Time   COLORURINE STRAW (A) 06/06/2017 0235   APPEARANCEUR CLEAR 06/06/2017 0235   LABSPEC 1.025 06/06/2017 0235   PHURINE 5.0 06/06/2017 0235   GLUCOSEU >=500 (A) 06/06/2017 0235   HGBUR MODERATE (A) 06/06/2017 0235   BILIRUBINUR NEGATIVE 06/06/2017 0235   KETONESUR 80 (A) 06/06/2017 0235   PROTEINUR  NEGATIVE 06/06/2017 0235   UROBILINOGEN 0.2 05/14/2014 1645   NITRITE NEGATIVE 06/06/2017 0235   LEUKOCYTESUR NEGATIVE 06/06/2017 0235   Sepsis Labs: @LABRCNTIP (procalcitonin:4,lacticidven:4) ) Recent Results (from the past 240 hour(s))  MRSA PCR Screening     Status: None   Collection Time: 06/06/17  6:28 AM  Result Value Ref Range Status   MRSA by PCR NEGATIVE NEGATIVE Final    Comment:        The GeneXpert MRSA Assay (FDA approved for NASAL specimens only), is one component of a comprehensive MRSA colonization surveillance program. It is not intended to diagnose MRSA infection nor to guide or monitor treatment for MRSA infections.      Scheduled Meds: . clotrimazole  10 mg Oral 5 X Daily  . dicyclomine  20 mg Oral TID AC & HS  . feeding supplement (PRO-STAT SUGAR FREE 64)  30 mL Oral BID  . FLUoxetine  20 mg Oral Daily  . gabapentin  100 mg Oral TID  . insulin aspart  0-15 Units Subcutaneous TID WC  . insulin aspart  0-5 Units Subcutaneous QHS  . insulin aspart  4 Units Subcutaneous TID WC  . [START ON 06/09/2017] insulin glargine  30 Units Subcutaneous Daily  .  nicotine  21 mg Transdermal Daily  . pantoprazole (PROTONIX) IV  40 mg Intravenous Q12H  . QUEtiapine  25 mg Oral QHS   Continuous Infusions: . 0.9 % NaCl with KCl 20 mEq / L 125 mL/hr at 06/08/17 1440    Procedures/Studies: No results found.  Fareeda Downard, DO  [facility] Pager [phone]  If 7PM-7AM, please contact night-coverage [email or web address] Password [email or web address] 06/08/2017, 4:52 PM   LOS: 2 days

## 2017-06-09 ENCOUNTER — Encounter (HOSPITAL_COMMUNITY)
Admission: EM | Disposition: A | Discharge status: Home / Self Care | Payer: Self-pay | Source: Home / Self Care | Attending: Internal Medicine

## 2017-06-09 ENCOUNTER — Encounter (HOSPITAL_COMMUNITY): Payer: Self-pay | Admitting: Gastroenterology

## 2017-06-09 ENCOUNTER — Inpatient Hospital Stay (HOSPITAL_COMMUNITY)

## 2017-06-09 DIAGNOSIS — E1311 Other specified diabetes mellitus with ketoacidosis with coma: Secondary | ICD-10-CM | POA: Diagnosis not present

## 2017-06-09 DIAGNOSIS — F1410 Cocaine abuse, uncomplicated: Secondary | ICD-10-CM | POA: Diagnosis not present

## 2017-06-09 DIAGNOSIS — K920 Hematemesis: Secondary | ICD-10-CM

## 2017-06-09 DIAGNOSIS — D696 Thrombocytopenia, unspecified: Secondary | ICD-10-CM

## 2017-06-09 DIAGNOSIS — R768 Other specified abnormal immunological findings in serum: Secondary | ICD-10-CM

## 2017-06-09 DIAGNOSIS — N179 Acute kidney failure, unspecified: Secondary | ICD-10-CM | POA: Diagnosis not present

## 2017-06-09 LAB — CBC
HCT: 43.6 % (ref 39.0–52.0)
Hemoglobin: 15 g/dL (ref 13.0–17.0)
MCH: 29 pg (ref 26.0–34.0)
MCHC: 34.4 g/dL (ref 30.0–36.0)
MCV: 84.3 fL (ref 78.0–100.0)
PLATELETS: 76 10*3/uL — AB (ref 150–400)
RBC: 5.17 MIL/uL (ref 4.22–5.81)
RDW: 12.7 % (ref 11.5–15.5)
WBC: 5.5 10*3/uL (ref 4.0–10.5)

## 2017-06-09 LAB — BASIC METABOLIC PANEL
Anion gap: 7 (ref 5–15)
BUN: 7 mg/dL (ref 6–20)
CALCIUM: 8.3 mg/dL — AB (ref 8.9–10.3)
CO2: 30 mmol/L (ref 22–32)
CREATININE: 0.49 mg/dL — AB (ref 0.61–1.24)
Chloride: 98 mmol/L — ABNORMAL LOW (ref 101–111)
GFR calc non Af Amer: >60 mL/min (ref >60)
Glucose, Bld: 241 mg/dL — ABNORMAL HIGH (ref 65–99)
Potassium: 3.7 mmol/L (ref 3.5–5.1)
SODIUM: 135 mmol/L (ref 135–145)

## 2017-06-09 LAB — GLUCOSE, CAPILLARY
GLUCOSE-CAPILLARY: 223 mg/dL — AB (ref 65–99)
GLUCOSE-CAPILLARY: 243 mg/dL — AB (ref 65–99)
Glucose-Capillary: 372 mg/dL — ABNORMAL HIGH (ref 65–99)
Glucose-Capillary: 203 mg/dL — ABNORMAL HIGH (ref 65–99)

## 2017-06-09 LAB — RAPID URINE DRUG SCREEN, HOSP PERFORMED
AMPHETAMINES: NOT DETECTED
BARBITURATES: NOT DETECTED
Benzodiazepines: NOT DETECTED
Cocaine: POSITIVE — AB
OPIATES: NOT DETECTED
Tetrahydrocannabinol: NOT DETECTED

## 2017-06-09 LAB — HEPATIC FUNCTION PANEL
ALK PHOS: 57 U/L (ref 38–126)
ALT: 18 U/L (ref 17–63)
AST: 20 U/L (ref 15–41)
Albumin: 3.3 g/dL — ABNORMAL LOW (ref 3.5–5.0)
BILIRUBIN DIRECT: 0.1 mg/dL (ref 0.1–0.5)
BILIRUBIN INDIRECT: 0.4 mg/dL (ref 0.3–0.9)
TOTAL PROTEIN: 6 g/dL — AB (ref 6.5–8.1)
Total Bilirubin: 0.5 mg/dL (ref 0.3–1.2)

## 2017-06-09 LAB — PROTIME-INR
INR: 0.96
PROTHROMBIN TIME: 12.7 s (ref 11.4–15.2)

## 2017-06-09 SURGERY — ESOPHAGOGASTRODUODENOSCOPY (EGD) WITH PROPOFOL
Anesthesia: Monitor Anesthesia Care

## 2017-06-09 MED ORDER — METOCLOPRAMIDE HCL 5 MG/ML IJ SOLN
10.0000 mg | Freq: Four times a day (QID) | INTRAMUSCULAR | Status: DC
  Administered 2017-06-09 – 2017-06-10 (×4): 10 mg via INTRAVENOUS
  Filled 2017-06-09 (×4): qty 2

## 2017-06-09 MED ORDER — PANTOPRAZOLE SODIUM 40 MG PO TBEC
40.0000 mg | DELAYED_RELEASE_TABLET | Freq: Two times a day (BID) | ORAL | Status: DC
  Administered 2017-06-09 – 2017-06-11 (×4): 40 mg via ORAL
  Filled 2017-06-09 (×4): qty 1

## 2017-06-09 MED ORDER — INSULIN GLARGINE 100 UNIT/ML ~~LOC~~ SOLN
15.0000 [IU] | Freq: Every day | SUBCUTANEOUS | Status: DC
  Filled 2017-06-09: qty 0.15

## 2017-06-09 MED ORDER — INSULIN GLARGINE 100 UNIT/ML ~~LOC~~ SOLN
16.0000 [IU] | Freq: Every day | SUBCUTANEOUS | Status: DC
  Administered 2017-06-09: 16 [IU] via SUBCUTANEOUS
  Filled 2017-06-09 (×2): qty 0.16

## 2017-06-09 MED ORDER — POTASSIUM CHLORIDE IN NACL 20-0.9 MEQ/L-% IV SOLN: INTRAVENOUS | Status: AC

## 2017-06-09 MED ORDER — SODIUM CHLORIDE 0.9 % IV SOLN: INTRAVENOUS | Status: DC

## 2017-06-09 MED ORDER — ACETAMINOPHEN 325 MG PO TABS
650.0000 mg | ORAL_TABLET | Freq: Four times a day (QID) | ORAL | Status: DC | PRN
  Administered 2017-06-09: 650 mg via ORAL
  Filled 2017-06-09: qty 2

## 2017-06-09 MED ORDER — KCL IN DEXTROSE-NACL 20-5-0.9 MEQ/L-%-% IV SOLN
INTRAVENOUS | Status: DC
  Administered 2017-06-09: via INTRAVENOUS

## 2017-06-09 MED ORDER — ONDANSETRON 4 MG PO TBDP
4.0000 mg | ORAL_TABLET | Freq: Three times a day (TID) | ORAL | Status: DC
  Administered 2017-06-09 – 2017-06-11 (×9): 4 mg via ORAL
  Filled 2017-06-09 (×9): qty 1

## 2017-06-09 NOTE — Progress Notes (Signed)
Fluids paused, pt being transported to ultrasound via transport staff.

## 2017-06-09 NOTE — Progress Notes (Signed)
PROGRESS NOTE  Matthew [name] ZOX:[id number] DOB: 01-10-1976 DOA: 06/06/2017 PCP: Jacquelin Hawking, PA-C  Brief History: 41 year old male with a history of diabetes mellitus type 1, Crohn's colitis, depression, hypertension, hepatitis C, cocaine abuse presenting with 3-day history of nausea, vomiting, and generalized weakness. The patient states that he has not taken his insulin for 4-5 days. He denies any fevers, chills, chest pain, shortness breath, dysuria, hematuria. He has some epigastric abdominal pain. The patient was arrested by the police department on a warrant on June 05, 2017. The patient was brought to the hospital by the police department secondary to his intractable vomiting. The patient also had 3 episodes of hematemesis on June 05, 2017. He states that he has been injecting cocaine for the last 2 days and has been injectingfor the past many weeks. He smokes 1 pack/day for the last 25 years. Denies any recent alcohol use. At the time of admission, the patient was noted to have serum glucose of 452 with an anion gap of 25 and ketonuria. He was started on intravenous insulin and IV fluids. GI was consulted to assist with management of his hematemesis.  Assessment/Plan: DKA, type 1 -patient started on IV insulin with q 1 hour CBG check and q 4 hour BMPs -pt started on aggressive fluid resuscitation -Electrolytes were monitored and repleted -transitioned to [facility] insulin once anion gap closed -diet was advanced once anion gap closed--continueclear liquid diet -HbA1C--12.2 on 05/05/17 -pt refused lantus this am -GIVE HALF DOSE LANTUS TONIGHT in preparation for EGD 11/13 due to npo  Hematemesis/Intractible vomiting -likelymultifactorial inculdingMallory-Weiss tear, NSAID inducedPUD(pt uses naproxen regularly), exacerbation of underlying gastroparesis, and gastritis/esophagitis -Hemoglobinremains stable--mild drop due to dilution -appreciate GI  eval>>>EGD planned 11/13 per GI -Continue Protonix drip>>>protonix q 12hrs -resolved -advanced to soft diet  Cocaine abuse/tobacco abuse -Cessation discussed -NicoDerm patch  Diabetes Mellitus type 1 -increase Lantus to 30 units-->-GIVE HALF DOSE LANTUS TONIGHT in preparation for EGD 11/13 due to npo -continue novolog sliding scale -add novolog 4 units with meals  Oropharyngeal candidiasis -start mycelex troches  AKI -Secondary to volume depletion -Continue IV fluids -improved -am BMP  Leukocytosis -Likely stress demargination -Monitor off antibiotics -Urinalysis negative for pyuria -improved  Hypokalemia -Repleted -Check magnesium--2.0  Hyponatremia -Secondary to volume depletion and pseudohyponatremia from hyperglycemia -Continue IV fluids-->improved  Thrombocytopenia -Likely due to chronic liver disease from hepatitis C vs meds (seroquel) -HIV--neg -Serum B12--716 -Hepatitis B surface antigen--neg -Hepatitis C antibody--positive -RUQ US--liver looked normal  Hep C antibody positive -check Hep C RNA--pending   Disposition Plan:[employer] 11/13 or 11/14 if cleared by GI Family Communication:NoFamily at bedside  Consultants:GI  Code Status: FULL  DVT Prophylaxis: SCDs      Subjective: Patient has some abdominal pain after eating soft foods today but did not have any vomiting.  He denies any fevers, chills, chest pain, shortness of breath, diarrhea, dysuria, hematuria.  Objective: Vitals:   06/09/17 0300 06/09/17 0700 06/09/17 1125 06/09/17 1556  BP: 127/74 (!) 141/85  124/80  Pulse: 64 68  78  Resp: 16 16  16   Temp: 97.8 F (36.6 C) 98 F (36.7 C)  98.4 F (36.9 C)  TempSrc: Oral Oral  Oral  SpO2: 97% 98% 98% 98%  Weight:      Height:        Intake/Output Summary (Last 24 hours) at 06/09/2017 1825 Last data filed at 06/09/2017 1700 Gross per 24 hour  Intake 3380 ml  Output 2900 ml  Net 480 ml    Weight change:  Exam:   General:  Pt is alert, follows commands appropriately, not in acute distress  HEENT: No icterus, No thrush, No neck mass, [place]/AT  Cardiovascular: RRR, S1/S2, no rubs, no gallops  Respiratory: CTA bilaterally, no wheezing, no crackles, no rhonchi  Abdomen: Soft/+BS, non tender, non distended, no guarding  Extremities: No edema, No lymphangitis, No petechiae, No rashes, no synovitis   Data Reviewed: I have personally reviewed following labs and imaging studies Basic Metabolic Panel: Recent Labs  Lab 06/06/17 0531 06/06/17 0723 06/07/17 0419 06/08/17 0719 06/09/17 0530  NA 129* 130* 134* 133* 135  K 3.3* 3.2* 3.6 3.6 3.7  CL 90* 91* 96* 97* 98*  CO2 24 26 28 31 30   GLUCOSE 272* 195* 223* 348* 241*  BUN 24* 24* 15 9 7   CREATININE 0.87 0.79 0.56* 0.54* 0.49*  CALCIUM 8.3* 8.3* 8.0* 8.4* 8.3*  MG  --   --  2.0  --   --    Liver Function Tests: Recent Labs  Lab 06/07/17 0841 06/09/17 1139  AST 16 20  ALT 20 18  ALKPHOS 55 57  BILITOT 1.6* 0.5  PROT 6.0* 6.0*  ALBUMIN 3.1* 3.3*   No results for input(s): LIPASE, AMYLASE in the last 168 hours. No results for input(s): AMMONIA in the last 168 hours. Coagulation Profile: Recent Labs  Lab 06/09/17 1139  INR 0.96   CBC: Recent Labs  Lab 06/06/17 0122 06/06/17 1208 06/07/17 0419 06/08/17 0719 06/09/17 0530  WBC 18.5*  --  8.9 5.7 5.5  HGB 17.1* 16.2 15.8 16.0 15.0  HCT 47.3 45.6 45.6 46.6 43.6  MCV 82.7  --  82.3 84.6 84.3  PLT 141*  --  103* 78* 76*   Cardiac Enzymes: No results for input(s): CKTOTAL, CKMB, CKMBINDEX, TROPONINI in the last 168 hours. BNP: Invalid input(s): POCBNP CBG: Recent Labs  Lab 06/08/17 1622 06/08/17 2042 06/09/17 0812 06/09/17 1128 06/09/17 1651  GLUCAP 98 304* 223* 243* 372*   HbA1C: No results for input(s): HGBA1C in the last 72 hours. Urine analysis:    Component Value Date/Time   COLORURINE STRAW (A) 06/06/2017 0235   APPEARANCEUR  CLEAR 06/06/2017 0235   LABSPEC 1.025 06/06/2017 0235   PHURINE 5.0 06/06/2017 0235   GLUCOSEU >=500 (A) 06/06/2017 0235   HGBUR MODERATE (A) 06/06/2017 0235   BILIRUBINUR NEGATIVE 06/06/2017 0235   KETONESUR 80 (A) 06/06/2017 0235   PROTEINUR NEGATIVE 06/06/2017 0235   UROBILINOGEN 0.2 05/14/2014 1645   NITRITE NEGATIVE 06/06/2017 0235   LEUKOCYTESUR NEGATIVE 06/06/2017 0235   Sepsis Labs: @LABRCNTIP (procalcitonin:4,lacticidven:4) ) Recent Results (from the past 240 hour(s))  MRSA PCR Screening     Status: None   Collection Time: 06/06/17  6:28 AM  Result Value Ref Range Status   MRSA by PCR NEGATIVE NEGATIVE Final    Comment:        The GeneXpert MRSA Assay (FDA approved for NASAL specimens only), is one component of a comprehensive MRSA colonization surveillance program. It is not intended to diagnose MRSA infection nor to guide or monitor treatment for MRSA infections.      Scheduled Meds: . clotrimazole  10 mg Oral 5 X Daily  . dicyclomine  20 mg Oral TID AC & HS  . feeding supplement (PRO-STAT SUGAR FREE 64)  30 mL Oral BID  . FLUoxetine  20 mg Oral Daily  . gabapentin  100 mg Oral TID  . insulin  aspart  0-15 Units Subcutaneous TID WC  . insulin aspart  0-5 Units Subcutaneous QHS  . insulin aspart  4 Units Subcutaneous TID WC  . insulin glargine  16 Units Subcutaneous QHS  . metoCLOPramide (REGLAN) injection  10 mg Intravenous Q6H  . nicotine  21 mg Transdermal Daily  . ondansetron  4 mg Oral TID AC & HS  . pantoprazole  40 mg Oral BID AC  . QUEtiapine  25 mg Oral QHS   Continuous Infusions: . 0.9 % NaCl with KCl 20 mEq / L    . [START ON 06/10/2017] dextrose 5 % and 0.9 % NaCl with KCl 20 mEq/L      Procedures/Studies: [place] Abdomen Limited Ruq  Result Date: 06/09/2017 CLINICAL DATA:  Hepatitis-C with hepatic cirrhosis EXAM: ULTRASOUND ABDOMEN LIMITED RIGHT UPPER QUADRANT COMPARISON:  CT abdomen and pelvis May 29, 2017 FINDINGS: Gallbladder: No  gallstones or wall thickening visualized. There is no appreciable pericholecystic fluid. No sonographic Murphy sign noted by sonographer. Common bile duct: Diameter: 4 mm. There is no intrahepatic or extrahepatic biliary duct dilatation. Liver: No focal lesion identified. Within normal limits in parenchymal echogenicity. Portal vein is patent on color Doppler imaging with normal direction of blood flow towards the liver. IMPRESSION: Study within normal limits. Electronically Signed   By: Bretta BangWilliam  Woodruff III M.D.   On: 06/09/2017 11:19    Annabella Elford, DO  [facility] Pager [phone]  If 7PM-7AM, please contact night-coverage [email or web address] Password [email or web address] 06/09/2017, 6:25 PM   LOS: 3 days

## 2017-06-09 NOTE — Progress Notes (Signed)
    Subjective: Upper abdominal pain persists but not worsened from admission. Nausea improved but still present. He states at some point he was told he may have "beginning stages of cirrhosis" a long time ago. No prior EGD. Last used cocaine 5 days ago. Urine drug screen performed stat today is positive for cocaine.   Objective: Vital signs in last 24 hours: Temp:  [97.4 F (36.3 C)-98.3 F (36.8 C)] 98 F (36.7 C) (11/12 0700) Pulse Rate:  [63-68] 68 (11/12 0700) Resp:  [16] 16 (11/12 0700) BP: (115-141)/(71-94) 141/85 (11/12 0700) SpO2:  [97 %-100 %] 98 % (11/12 0700) Last BM Date: 06/07/17 General:   Alert and oriented, flat affect  Head:  Normocephalic and atraumatic. Abdomen:  Bowel sounds present, soft, TTP LUQ/epigastric Msk:  Symmetrical without gross deformities. Normal posture. Extremities:  Without  Edema. Cuffs present bilateral lower lergs Neurologic:  Alert and  oriented x4 Psych:  Alert and cooperative.   Intake/Output from previous day: 11/11 0701 - 11/12 0700 In: 2455 [P.O.:1740; I.V.:715] Out: 4025 [Urine:4025] Intake/Output this shift: Total I/O In: -  Out: 700 [Urine:700]  Lab Results: Recent Labs    06/07/17 0419 06/08/17 0719 06/09/17 0530  WBC 8.9 5.7 5.5  HGB 15.8 16.0 15.0  HCT 45.6 46.6 43.6  PLT 103* 78* 76*   BMET Recent Labs    06/07/17 0419 06/08/17 0719 06/09/17 0530  NA 134* 133* 135  K 3.6 3.6 3.7  CL 96* 97* 98*  CO2 28 31 30   GLUCOSE 223* 348* 241*  BUN 15 9 7   CREATININE 0.56* 0.54* 0.49*  CALCIUM 8.0* 8.4* 8.3*   LFT Recent Labs    06/07/17 0841  PROT 6.0*  ALBUMIN 3.1*  AST 16  ALT 20  ALKPHOS 55  BILITOT 1.6*  BILIDIR 0.2  IBILI 1.4*   Hepatitis Panel Recent Labs    06/06/17 1149  HEPBSAG Negative  HCVAB >11.0*    Assessment: 41 year old male admitted with DKA, and GI consulted for hematemesis in setting of several day history of vomiting. Differentials include MW tear, gastritis, esophagitis,  PUD, doubt variceal bleed. Noted to have oropharyngeal candidiasis on admission. Hemoglobin has remained stable. He states he was told many years ago he may have liver disease (cirrhosis).   Hep C positive antibody: RNA pending, Hep B surface antigen negative. HIV antibody non-reactive. 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 Thrombocytopenia noted dating back to 2013 with last abdominal imaging on file from 2013. Would benefit from updated ultrasound of abdomen in near future due to question of underlying liver disease.   No further hematemesis. No melena. Abdominal pain without worsening, and nausea improving but still present. Will change PPI to oral BID, add scheduled Zofran as he could have delayed gastric emptying in setting of poorly controlled diabetes. EGD has been cancelled for today due to presence of cocaine on urine drug screen.    Plan: Check HFP, INR today Hold on EGD today: UDS positive for cocaine PPI BID Add scheduled Zofran Dysphagia 3 diet Update abdominal imaging with ultrasound of abdomen  Await Hep C RNA (pending)  Gelene Mink, PhD, ANP-BC [facility]    LOS: 3 days    06/09/2017, 7:46 AM

## 2017-06-10 ENCOUNTER — Encounter (HOSPITAL_COMMUNITY)
Admission: EM | Disposition: A | Discharge status: Home / Self Care | Payer: Self-pay | Source: Home / Self Care | Attending: Internal Medicine

## 2017-06-10 ENCOUNTER — Encounter (HOSPITAL_COMMUNITY): Payer: Self-pay

## 2017-06-10 ENCOUNTER — Other Ambulatory Visit: Payer: Self-pay

## 2017-06-10 DIAGNOSIS — R768 Other specified abnormal immunological findings in serum: Secondary | ICD-10-CM | POA: Diagnosis not present

## 2017-06-10 DIAGNOSIS — K920 Hematemesis: Secondary | ICD-10-CM | POA: Diagnosis not present

## 2017-06-10 DIAGNOSIS — N179 Acute kidney failure, unspecified: Secondary | ICD-10-CM | POA: Diagnosis not present

## 2017-06-10 DIAGNOSIS — F1410 Cocaine abuse, uncomplicated: Secondary | ICD-10-CM | POA: Diagnosis not present

## 2017-06-10 DIAGNOSIS — K2980 Duodenitis without bleeding: Secondary | ICD-10-CM

## 2017-06-10 DIAGNOSIS — K210 Gastro-esophageal reflux disease with esophagitis: Secondary | ICD-10-CM

## 2017-06-10 DIAGNOSIS — K2990 Gastroduodenitis, unspecified, without bleeding: Secondary | ICD-10-CM

## 2017-06-10 DIAGNOSIS — K2970 Gastritis, unspecified, without bleeding: Secondary | ICD-10-CM | POA: Diagnosis not present

## 2017-06-10 HISTORY — PX: ESOPHAGOGASTRODUODENOSCOPY: SHX5428

## 2017-06-10 LAB — GLUCOSE, CAPILLARY
GLUCOSE-CAPILLARY: 236 mg/dL — AB (ref 65–99)
GLUCOSE-CAPILLARY: 176 mg/dL — AB (ref 65–99)
GLUCOSE-CAPILLARY: 89 mg/dL (ref 65–99)
Glucose-Capillary: 136 mg/dL — ABNORMAL HIGH (ref 65–99)
Glucose-Capillary: 281 mg/dL — ABNORMAL HIGH (ref 65–99)

## 2017-06-10 LAB — HCV RNA QUANT RFLX ULTRA OR GENOTYP
HCV RNA Qnt(log copy/mL): 6.394 log10 IU/mL
HEPATITIS C QUANTITATION: 2480000 [IU]/mL

## 2017-06-10 LAB — HEPATITIS C GENOTYPE

## 2017-06-10 SURGERY — EGD (ESOPHAGOGASTRODUODENOSCOPY)
Anesthesia: Moderate Sedation

## 2017-06-10 MED ORDER — MEPERIDINE HCL 100 MG/ML IJ SOLN
INTRAMUSCULAR | Status: AC
  Filled 2017-06-10: qty 2

## 2017-06-10 MED ORDER — MIDAZOLAM HCL 5 MG/5ML IJ SOLN
INTRAMUSCULAR | Status: AC
  Filled 2017-06-10: qty 10

## 2017-06-10 MED ORDER — LIDOCAINE VISCOUS 2 % MT SOLN
OROMUCOSAL | Status: DC | PRN
  Administered 2017-06-10: 1 via OROMUCOSAL

## 2017-06-10 MED ORDER — LIDOCAINE VISCOUS 2 % MT SOLN
OROMUCOSAL | Status: AC
  Filled 2017-06-10: qty 15

## 2017-06-10 MED ORDER — PROMETHAZINE HCL 25 MG/ML IJ SOLN
INTRAMUSCULAR | Status: AC
  Filled 2017-06-10: qty 1

## 2017-06-10 MED ORDER — MIDAZOLAM HCL 5 MG/5ML IJ SOLN
INTRAMUSCULAR | Status: DC | PRN
  Administered 2017-06-10: 1 mg via INTRAVENOUS
  Administered 2017-06-10: 2 mg via INTRAVENOUS

## 2017-06-10 MED ORDER — INSULIN GLARGINE 100 UNIT/ML ~~LOC~~ SOLN
30.0000 [IU] | Freq: Every day | SUBCUTANEOUS | Status: DC
  Administered 2017-06-10: 30 [IU] via SUBCUTANEOUS
  Filled 2017-06-10 (×2): qty 0.3

## 2017-06-10 MED ORDER — MEPERIDINE HCL 100 MG/ML IJ SOLN
INTRAMUSCULAR | Status: DC | PRN
  Administered 2017-06-10: 50 mg
  Administered 2017-06-10: 25 mg

## 2017-06-10 MED ORDER — KCL IN DEXTROSE-NACL 20-5-0.9 MEQ/L-%-% IV SOLN
INTRAVENOUS | Status: DC
  Administered 2017-06-10 (×2): via INTRAVENOUS

## 2017-06-10 MED ORDER — PROMETHAZINE HCL 25 MG/ML IJ SOLN
INTRAMUSCULAR | Status: DC | PRN
  Administered 2017-06-10: 25 mg via INTRAVENOUS
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
## 2017-06-10 NOTE — Op Note (Signed)
[facility] Patient Name: Matthew [name] Procedure Date: 06/10/2017 2:23 PM MRN: [id number] Date of Birth: 09/13/1975 Attending MD: Jonette Eva MD, MD CSN: [id number] Age: 41 Admit Type: Inpatient Procedure:                Upper GI endoscopy WITH COLD FORCEPS BIOPSY Indications:              Hematemesis Providers:                Jonette Eva MD, MD, Buel Ream. Thomasena Edis RN, RN,                            Dyann Ruddle Referring MD:              Medicines:                Promethazine 25 mg IV, Meperidine 50 mg IV,                            Midazolam 3 mg IV Complications:            No immediate complications. Estimated Blood Loss:     Estimated blood loss was minimal. Procedure:                Pre-Anesthesia Assessment:                           - Prior to the procedure, a History and Physical                            was performed, and patient medications and                            allergies were reviewed. The patient's tolerance of                            previous anesthesia was also reviewed. The risks                            and benefits of the procedure and the sedation                            options and risks were discussed with the patient.                            All questions were answered, and informed consent                            was obtained. Prior Anticoagulants: The patient has                            taken no previous anticoagulant or antiplatelet                            agents. ASA Grade Assessment: II - A patient with  mild systemic disease. After reviewing the risks                            and benefits, the patient was deemed in                            satisfactory condition to undergo the procedure.                            After obtaining informed consent, the endoscope was                            passed under direct vision. Throughout the                            procedure, the patient's  blood pressure, pulse, and                            oxygen saturations were monitored continuously. The                            [id number]-[id number] ([id number]) scope was introduced through the                            mouth, and advanced to the second part of duodenum.                            The upper GI endoscopy was accomplished without                            difficulty. The patient tolerated the procedure                            well. Scope In: 2:56:01 PM Scope Out: 3:04:08 PM Total Procedure Duration: 0 hours 8 minutes 7 seconds  Findings:      LA Grade C (one or more mucosal breaks continuous between tops of 2 or       more mucosal folds, less than 75% circumference) esophagitis was found.      Diffuse moderate inflammation characterized by congestion (edema),       erythema and linear erosions was found in the entire examined stomach.       Biopsies were taken with a cold forceps for Helicobacter pylori testing.      Segmental moderate inflammation characterized by congestion (edema),       erosions and erythema was found in the duodenal bulb.      The second portion of the duodenum was normal. Impression:               - HEMATEMESIS MOST LIKELY DUE TO EROSIVE                            esophagitis IN THE SETTING OF THROMBOCYTOPENIA.                           - MODERATE Gastritis & Duodenitis.  Moderate Sedation:      Moderate (conscious) sedation was administered by the endoscopy nurse       and supervised by the endoscopist. The following parameters were       monitored: oxygen saturation, heart rate, blood pressure, and response       to care. Total physician intraservice time was 15 minutes. Recommendation:           - Await pathology results.                           - Use Protonix (pantoprazole) 40 mg PO BID FOR 3                            MOS THEN DAILY.                           - Low fat diet.                           - Continue present medications.                            - Return patient to hospital ward for ongoing care. Procedure Code(s):        --- Professional ---                           [phone], Esophagogastroduodenoscopy, flexible,                            transoral; with biopsy, single or multiple                           99152, Moderate sedation services provided by the                            same physician or other qualified health care                            professional performing the diagnostic or                            therapeutic service that the sedation supports,                            requiring the presence of an independent trained                            observer to assist in the monitoring of the                            patient's level of consciousness and physiological                            status; initial 15 minutes of intraservice time,  patient age 79 years or older Diagnosis Code(s):        --- Professional ---                           K21.0, Gastro-esophageal reflux disease with                            esophagitis                           K29.70, Gastritis, unspecified, without bleeding                           K29.80, Duodenitis without bleeding                           K92.0, Hematemesis CPT copyright 2016 [employer]. All rights reserved. The codes documented in this report are preliminary and upon coder review may  be revised to meet current compliance requirements. Jonette Eva, MD Jonette Eva MD, MD 06/10/2017 3:11:53 PM This report has been signed electronically. Number of Addenda: 0

## 2017-06-10 NOTE — Progress Notes (Signed)
PROGRESS NOTE  Matthew [name] QQI:[id number] DOB: 1975-10-27 DOA: 06/06/2017 PCP: Jacquelin Hawking, PA-C  Brief History: 41 year old male with a history of diabetes mellitus type 1, Crohn's colitis, depression, hypertension, hepatitis C, cocaine abuse presenting with 3-day history of nausea, vomiting, and generalized weakness. The patient states that he has not taken his insulin for 4-5 days. He denies any fevers, chills, chest pain, shortness breath, dysuria, hematuria. He has some epigastric abdominal pain. The patient was arrested by the police department on a warrant on June 05, 2017. The patient was brought to the hospital by the police department secondary to his intractable vomiting. The patient also had 3 episodes of hematemesis on June 05, 2017. He states that he has been injecting cocaine for the last 2 days and has been injectingfor the past many weeks. He smokes 1 pack/day for the last 25 years. Denies any recent alcohol use. At the time of admission, the patient was noted to have serum glucose of 452 with an anion gap of 25 and ketonuria. He was started on intravenous insulin and IV fluids. GI was consulted to assist with management of his hematemesis.  Assessment/Plan: DKA, type 1 -patient started on IV insulin with q 1 hour CBG check and q 4 hour BMPs -pt started on aggressive fluid resuscitation -Electrolytes were monitored and repleted -transitioned to [facility] insulin once anion gap closed -diet was advanced once anion gap closed--continueclear liquid diet -HbA1C--12.2 on 05/05/17 -pt refused lantus this am -GIVEN HALF DOSE LANTUS 06/09/17 evening in preparation for EGD 11/13 due to npo -restart Lantus 30 units q hs 06/10/17 evening  Hematemesis/Intractible vomiting -likelymultifactorial inculdingMallory-Weiss tear, NSAID inducedPUD(pt uses naproxen regularly), exacerbation of underlying gastroparesis, and  gastritis/esophagitis -Hemoglobinremains stable--mild drop due to dilution -appreciate GI eval>>>EGD planned 11/13 per GI -Continue Protonix drip>>>protonix q 12hrs -resolved -advanced to soft diet--tolerating but with intermittent epigastric pain  Cocaine abuse/tobacco abuse -Cessation discussed -NicoDerm patch  Diabetes Mellitus type 1 -increase Lantus to30units-->-GIVE HALF DOSE LANTUS 06/09/17 evening in preparation for EGD 11/13 due to npo -restart Lantus 30 units q hs 06/10/17 evening -continue novolog sliding scale -add novolog 4 units with meals  Oropharyngeal candidiasis -start mycelex troches D#4 of 7  AKI -Secondary to volume depletion -Continue IV fluids -improved -am BMP  Leukocytosis -Likely stress demargination -Monitor off antibiotics -Urinalysis negative for pyuria -improved  Hypokalemia -Repleted -Check magnesium--2.0  Hyponatremia -Secondary to volume depletion and pseudohyponatremia from hyperglycemia -Continue IV fluids-->improved  Thrombocytopenia -Likely due to chronic liver disease from hepatitis C vs meds (seroquel) -HIV--neg -Serum B12--716 -Hepatitis B surface antigen--neg -Hepatitis C antibody--positive -RUQ US--liver looked normal  Hep C antibody positive -check Hep C RNA--pending   Disposition Plan:[employer] 11/13 or 11/14 ifcleared by GI Family Communication:NoFamily at bedside  Consultants:GI  Code Status: FULL  DVT Prophylaxis: SCDs      Subjective: He has a epigastric pain, but he is tolerating a soft diet.  He denies any fevers, chills, headache, chest pain, shortness breath, vomiting, diarrhea.  Objective: Vitals:   06/09/17 1800 06/09/17 2000 06/09/17 2128 06/10/17 0601  BP: 126/78 113/67  118/63  Pulse: 81 60  60  Resp: 18 20  20   Temp: 98.5 F (36.9 C) 97.9 F (36.6 C)  97.6 F (36.4 C)  TempSrc: Oral Oral  Oral  SpO2: 98% 100% 98% 99%  Weight:      Height:         Intake/Output Summary (Last 24  hours) at 06/10/2017 1116 Last data filed at 06/10/2017 0800 Gross per 24 hour  Intake 2493.33 ml  Output 2975 ml  Net -481.67 ml   Weight change:  Exam:   General:  Pt is alert, follows commands appropriately, not in acute distress  HEENT: No icterus, No thrush, No neck mass, [place]/AT  Cardiovascular: RRR, S1/S2, no rubs, no gallops  Respiratory: CTA bilaterally, no wheezing, no crackles, no rhonchi  Abdomen: Soft/+BS, mild epigastric tender, non distended, no guarding  Extremities: No edema, No lymphangitis, No petechiae, No rashes, no synovitis   Data Reviewed: I have personally reviewed following labs and imaging studies Basic Metabolic Panel: Recent Labs  Lab 06/06/17 0531 06/06/17 0723 06/07/17 0419 06/08/17 0719 06/09/17 0530  NA 129* 130* 134* 133* 135  K 3.3* 3.2* 3.6 3.6 3.7  CL 90* 91* 96* 97* 98*  CO2 24 26 28 31 30   GLUCOSE 272* 195* 223* 348* 241*  BUN 24* 24* 15 9 7   CREATININE 0.87 0.79 0.56* 0.54* 0.49*  CALCIUM 8.3* 8.3* 8.0* 8.4* 8.3*  MG  --   --  2.0  --   --    Liver Function Tests: Recent Labs  Lab 06/07/17 0841 06/09/17 1139  AST 16 20  ALT 20 18  ALKPHOS 55 57  BILITOT 1.6* 0.5  PROT 6.0* 6.0*  ALBUMIN 3.1* 3.3*   No results for input(s): LIPASE, AMYLASE in the last 168 hours. No results for input(s): AMMONIA in the last 168 hours. Coagulation Profile: Recent Labs  Lab 06/09/17 1139  INR 0.96   CBC: Recent Labs  Lab 06/06/17 0122 06/06/17 1208 06/07/17 0419 06/08/17 0719 06/09/17 0530  WBC 18.5*  --  8.9 5.7 5.5  HGB 17.1* 16.2 15.8 16.0 15.0  HCT 47.3 45.6 45.6 46.6 43.6  MCV 82.7  --  82.3 84.6 84.3  PLT 141*  --  103* 78* 76*   Cardiac Enzymes: No results for input(s): CKTOTAL, CKMB, CKMBINDEX, TROPONINI in the last 168 hours. BNP: Invalid input(s): POCBNP CBG: Recent Labs  Lab 06/09/17 0812 06/09/17 1128 06/09/17 1651 06/09/17 2105 06/10/17 0801  GLUCAP 223* 243*  372* 203* 236*   HbA1C: No results for input(s): HGBA1C in the last 72 hours. Urine analysis:    Component Value Date/Time   COLORURINE STRAW (A) 06/06/2017 0235   APPEARANCEUR CLEAR 06/06/2017 0235   LABSPEC 1.025 06/06/2017 0235   PHURINE 5.0 06/06/2017 0235   GLUCOSEU >=500 (A) 06/06/2017 0235   HGBUR MODERATE (A) 06/06/2017 0235   BILIRUBINUR NEGATIVE 06/06/2017 0235   KETONESUR 80 (A) 06/06/2017 0235   PROTEINUR NEGATIVE 06/06/2017 0235   UROBILINOGEN 0.2 05/14/2014 1645   NITRITE NEGATIVE 06/06/2017 0235   LEUKOCYTESUR NEGATIVE 06/06/2017 0235   Sepsis Labs: @LABRCNTIP (procalcitonin:4,lacticidven:4) ) Recent Results (from the past 240 hour(s))  MRSA PCR Screening     Status: None   Collection Time: 06/06/17  6:28 AM  Result Value Ref Range Status   MRSA by PCR NEGATIVE NEGATIVE Final    Comment:        The GeneXpert MRSA Assay (FDA approved for NASAL specimens only), is one component of a comprehensive MRSA colonization surveillance program. It is not intended to diagnose MRSA infection nor to guide or monitor treatment for MRSA infections.      Scheduled Meds: . clotrimazole  10 mg Oral 5 X Daily  . dicyclomine  20 mg Oral TID AC & HS  . feeding supplement (PRO-STAT SUGAR FREE 64)  30 mL Oral BID  .  FLUoxetine  20 mg Oral Daily  . gabapentin  100 mg Oral TID  . insulin aspart  0-15 Units Subcutaneous TID WC  . insulin aspart  0-5 Units Subcutaneous QHS  . insulin aspart  4 Units Subcutaneous TID WC  . insulin glargine  16 Units Subcutaneous QHS  . metoCLOPramide (REGLAN) injection  10 mg Intravenous Q6H  . nicotine  21 mg Transdermal Daily  . ondansetron  4 mg Oral TID AC & HS  . pantoprazole  40 mg Oral BID AC  . QUEtiapine  25 mg Oral QHS   Continuous Infusions: . dextrose 5 % and 0.9 % NaCl with KCl 20 mEq/L 50 mL/hr at 06/09/17 2350    Procedures/Studies: US Abdomen Limited Ruq  Result Date: 06/09/2017 CLINICAL DATA:  Hepatitis-C with  hepatic cirrhosis EXAM: ULTRASOUND ABDOMEN LIMITED RIGHT UPPER QUADRANT COMPARISON:  CT abdomen and pelvis May 29, 2017 FINDINGS: Gallbladder: No gallstones or wall thickening visualized. There is no appreciable pericholecystic fluid. No sonographic Murphy sign noted by sonographer. Common bile duct: Diameter: 4 mm. There is no intrahepatic or extrahepatic biliary duct dilatation. Liver: No focal lesion identified. Within normal limits in parenchymal echogenicity. Portal vein is patent on color Doppler imaging with normal direction of blood flow towards the liver. IMPRESSION: Study within normal limits. Electronically Signed   By: Bretta Bang III M.D.   On: 06/09/2017 11:19    Eileene Kisling, DO  [facility] Pager [phone]  If 7PM-7AM, please contact night-coverage [email or web address] Password [email or web address] 06/10/2017, 11:16 AM   LOS: 4 days

## 2017-06-10 NOTE — Progress Notes (Signed)
For EGD today with Dr. Darrick Penna. Patient remains NPO. Patient aware and ready to proceed. Understands risks and benefits.  Matthew Mink, PhD, ANP-BC [facility]

## 2017-06-11 DIAGNOSIS — K2990 Gastroduodenitis, unspecified, without bleeding: Secondary | ICD-10-CM | POA: Diagnosis not present

## 2017-06-11 DIAGNOSIS — F1910 Other psychoactive substance abuse, uncomplicated: Secondary | ICD-10-CM | POA: Diagnosis not present

## 2017-06-11 DIAGNOSIS — B370 Candidal stomatitis: Secondary | ICD-10-CM

## 2017-06-11 DIAGNOSIS — B182 Chronic viral hepatitis C: Secondary | ICD-10-CM | POA: Diagnosis not present

## 2017-06-11 DIAGNOSIS — R768 Other specified abnormal immunological findings in serum: Secondary | ICD-10-CM | POA: Diagnosis not present

## 2017-06-11 DIAGNOSIS — K2100 Gastro-esophageal reflux disease with esophagitis, without bleeding: Secondary | ICD-10-CM

## 2017-06-11 DIAGNOSIS — F1410 Cocaine abuse, uncomplicated: Secondary | ICD-10-CM | POA: Diagnosis not present

## 2017-06-11 DIAGNOSIS — N179 Acute kidney failure, unspecified: Secondary | ICD-10-CM | POA: Diagnosis not present

## 2017-06-11 DIAGNOSIS — Z720 Tobacco use: Secondary | ICD-10-CM

## 2017-06-11 DIAGNOSIS — E876 Hypokalemia: Secondary | ICD-10-CM

## 2017-06-11 DIAGNOSIS — D696 Thrombocytopenia, unspecified: Secondary | ICD-10-CM | POA: Diagnosis not present

## 2017-06-11 DIAGNOSIS — K2970 Gastritis, unspecified, without bleeding: Secondary | ICD-10-CM | POA: Diagnosis not present

## 2017-06-11 DIAGNOSIS — E1311 Other specified diabetes mellitus with ketoacidosis with coma: Secondary | ICD-10-CM

## 2017-06-11 LAB — GLUCOSE, CAPILLARY
GLUCOSE-CAPILLARY: 191 mg/dL — AB (ref 65–99)
Glucose-Capillary: 234 mg/dL — ABNORMAL HIGH (ref 65–99)

## 2017-06-11 MED ORDER — INSULIN ASPART 100 UNIT/ML ~~LOC~~ SOLN: 0.0000 [IU] | Freq: Three times a day (TID) | SUBCUTANEOUS | 11 refills | Status: AC

## 2017-06-11 MED ORDER — PANTOPRAZOLE SODIUM 40 MG PO TBEC: 40.0000 mg | DELAYED_RELEASE_TABLET | Freq: Two times a day (BID) | ORAL | 2 refills | Status: AC

## 2017-06-11 MED ORDER — INSULIN ASPART 100 UNIT/ML ~~LOC~~ SOLN: 0.0000 [IU] | Freq: Every day | SUBCUTANEOUS | 11 refills | Status: AC

## 2017-06-11 MED ORDER — CLOTRIMAZOLE 10 MG MT TROC: 10.0000 mg | Freq: Every day | OROMUCOSAL | 0 refills | Status: AC

## 2017-06-11 MED ORDER — ACETAMINOPHEN 325 MG PO TABS: 650.0000 mg | ORAL_TABLET | Freq: Four times a day (QID) | ORAL | Status: AC | PRN

## 2017-06-11 MED ORDER — INSULIN GLARGINE 100 UNIT/ML ~~LOC~~ SOLN: 30.0000 [IU] | Freq: Every day | SUBCUTANEOUS | 11 refills | Status: AC

## 2017-06-11 NOTE — Progress Notes (Signed)
Subjective:  Tolerating diet.  Mild epigastric pain.  No vomiting.  Objective: Vital signs in last 24 hours: Temp:  [97.5 F (36.4 C)-99 F (37.2 C)] 97.5 F (36.4 C) (11/14 0335) Pulse Rate:  [51-57] 52 (11/14 0335) Resp:  [10-18] 18 (11/14 0335) BP: (120-130)/(75-82) 130/82 (11/14 0335) SpO2:  [95 %-100 %] 98 % (11/14 0335) Last BM Date: 06/10/17 General:   Alert,  Well-developed, well-nourished, pleasant and cooperative in NAD Head:  Normocephalic and atraumatic. Eyes:  Sclera clear, no icterus.  Abdomen:  Soft, nontender and nondistended. Normal bowel sounds, without guarding, and without rebound.   Extremities:  Without clubbing, deformity or edema. Neurologic:  Alert and  oriented x4;  grossly normal neurologically. Skin:  Intact without significant lesions or rashes. Psych:  Alert and cooperative. Normal mood and affect.  Intake/Output from previous day: 11/13 0701 - 11/14 0700 In: 487.5 [P.O.:480; I.V.:7.5] Out: 2750 [Urine:2750] Intake/Output this shift: No intake/output data recorded.  Lab Results: CBC Recent Labs    06/09/17 0530  WBC 5.5  HGB 15.0  HCT 43.6  MCV 84.3  PLT 76*   BMET Recent Labs    06/09/17 0530  NA 135  K 3.7  CL 98*  CO2 30  GLUCOSE 241*  BUN 7  CREATININE 0.49*  CALCIUM 8.3*   LFTs Recent Labs    06/09/17 1139  BILITOT 0.5  BILIDIR 0.1  IBILI 0.4  ALKPHOS 57  AST 20  ALT 18  PROT 6.0*  ALBUMIN 3.3*   No results for input(s): LIPASE in the last 72 hours. PT/INR Recent Labs    06/09/17 1139  LABPROT 12.7  INR 0.96      Imaging Studies: US Abdomen Limited Ruq  Result Date: 06/09/2017 CLINICAL DATA:  Hepatitis-C with hepatic cirrhosis EXAM: ULTRASOUND ABDOMEN LIMITED RIGHT UPPER QUADRANT COMPARISON:  CT abdomen and pelvis May 29, 2017 FINDINGS: Gallbladder: No gallstones or wall thickening visualized. There is no appreciable pericholecystic fluid. No sonographic Murphy sign noted by sonographer. Common bile  duct: Diameter: 4 mm. There is no intrahepatic or extrahepatic biliary duct dilatation. Liver: No focal lesion identified. Within normal limits in parenchymal echogenicity. Portal vein is patent on color Doppler imaging with normal direction of blood flow towards the liver. IMPRESSION: Study within normal limits. Electronically Signed   By: Bretta Bang III M.D.   On: 06/09/2017 11:19  [2 weeks]   Assessment: 41 year old male admitted with DKA, GI consulted for hematemesis in the setting of several day history of vomiting.  Noted to have oropharyngeal candidiasis on admission. Hemoglobin has remained stable.    Patient gives history of cirrhosis although on this admission he had an ultrasound that showed normal-appearing liver.  No evidence of portal hypertension on EGD.  Hep C positive antibody, RNA positive as well.  Hepatitis B surface antigen and HIV antibody both negative.  Thrombocytopenia: Previously intermittently mildly decreased, 141,000 on admission down to 76,000 two days ago.   EGD yesterday showed LA grade C esophagitis, moderate gastritis and duodenitis.  Plan: 1. Pantoprazole 40 mg twice daily for 3 months then 40 mg daily 2. Await pathology results. 3. Treatment of hepatitis C per [facility] protocol. 4. Patient stable for discharge from a GI standpoint.  Will sign off.  Leanna Battles. [facility] [phone] 11/14/20188:01 AM     LOS: 5 days

## 2017-06-11 NOTE — Progress Notes (Signed)
Jessy Otoommy J [facility]out [facility] per MD order.  Discharge instructions reviewed and discussed with the patient, all questions and concerns answered. Copy of instructions and scripts given to police officer. Report [facility]ley [facility]. D/C summary faxed to Rosanna RandyWesley Wade.  Allergies as of 06/11/2017   No Known Allergies     Medication List    STOP taking these medications   naproxen 500 MG tablet Commonly known as:  NAPROSYN     TAKE these medications   acetaminophen 325 MG tablet Commonly known as:  TYLENOL Take 2 tablets (650 mg total) every 6 (six) hours as needed by mouth for mild pain or fever.   clotrimazole 10 MG troche Commonly known as:  MYCELEX Take 1 tablet (10 mg total) 5 (five) times daily by mouth.   dicyclomine 20 MG tablet Commonly known as:  BENTYL Take 1 tablet (20 mg total) by mouth 4 (four) times daily -  before meals and at bedtime. For IBS symptoms.   FLUoxetine 20 MG capsule Commonly known as:  PROZAC Take 1 capsule (20 mg total) by mouth daily. For depression   gabapentin 100 MG capsule Commonly known as:  NEURONTIN Take 1 capsule (100 mg total) by mouth 3 (three) times daily. For agitation   hydrOXYzine 25 MG tablet Commonly known as:  ATARAX/VISTARIL Take 1 tablet (25 mg total) by mouth every 6 (six) hours as needed for anxiety.   insulin aspart 100 UNIT/ML injection Commonly known as:  novoLOG Inject 0-15 Units 3 (three) times daily with meals into the skin. CBG 70 - 120: 0 units  CBG 121 - 150: 2 units  CBG 151 - 200: 3 units  CBG 201 - 250: 5 units  CBG 251 - 300: 8 units  CBG 301 - 350: 11 units  CBG 351 - 400: 15 units   insulin aspart 100 UNIT/ML injection Commonly known as:  novoLOG Inject 0-5 Units at bedtime into the skin. CBG 70 - 120: 0 units  CBG 121 - 150: 0 units  CBG 151 - 200: 0 units  CBG 201 - 250: 2 units  CBG 251 - 300: 3 units  CBG 301 - 350: 4 units  CBG 351 - 400: 5 units   insulin  glargine 100 UNIT/ML injection Commonly known as:  LANTUS Inject 0.3 mLs (30 Units total) at bedtime into the skin. What changed:    how much to take  when to take this  additional instructions   lisinopril 40 MG tablet Commonly known as:  PRINIVIL,ZESTRIL Take 1 tablet daily by mouth.   nicotine 21 mg/24hr patch Commonly known as:  NICODERM CQ - dosed in mg/24 hours Place 1 patch (21 mg total) onto the skin daily. (May buy from over the counter): For smoking cessation   pantoprazole 40 MG tablet Commonly known as:  PROTONIX Take 1 tablet (40 mg total) 2 (two) times daily before a meal by mouth.   QUEtiapine 25 MG tablet Commonly known as:  SEROQUEL Take 1 tablet (25 mg total) by mouth at bedtime. For mood control        IV site discontinued and catheter remains intact. Site without signs and symptoms of complications. Dressing and pressure applied.  Patient escorted to police transport Zenaida Niecevan in a wheelchair,  no distress noted upon discharge.  Rica KoyanagiBonnie M Kevia Zaucha 06/11/2017 3:29 PM

## 2017-06-11 NOTE — Discharge Summary (Signed)
Physician Discharge Summary  Matthew [name]:[id number] DOB: Jul 16, 1976 DOA: 06/06/2017  PCP: Jacquelin Hawking, PA-C  Admit date: 06/06/2017 Discharge date: 06/11/2017  Admitted From: correctional facility Disposition: same  Recommendations for Outpatient Follow-up:  1. Protonix BID x 3 mo and then daiy 2. F/u on Hep C  Discharge Condition:  stable   CODE STATUS:  Full code   Consultations:  GI- Dr Darrick Penna    Discharge Diagnoses:  Principal Problem: GI bleed:   Gastroesophageal reflux disease with esophagitis   Gastritis and gastroduodenitis Active Problems:   Tobacco use   DKA (diabetic ketoacidoses) (HCC)   AKI (acute kidney injury) (HCC)   Hypokalemia   Thrombocytopenia (HCC)   Polysubstance abuse (HCC)   Cocaine abuse (HCC)   Oral pharyngeal candidiasis   Hepatitis C antibody positive in blood   Chronic hepatitis C without hepatic coma (HCC)    Subjective: Mild mid upper and left sided abdominal pain. No nausea, vomiting, diarrhea or constipation. No other complaints.   Brief Summary: 41 year old male with a history of diabetes mellitus type 1, Crohn's colitis, depression, hypertension, hepatitis C, cocaine and nicotineabuse presenting with 3-day history of nausea, vomiting, and generalized weakness. The patient states that he has not taken his insulin for 4-5 days. He denies any fevers, chills, chest pain, shortness breath, dysuria, hematuria. He has some epigastric abdominal pain. The patient was arrested by the police department on a warrant on June 05, 2017. The patient was brought to the hospital by the police department secondary to his intractable vomiting. The patient also had 3 episodes of hematemesis on June 05, 2017. He states that he has been injecting cocaine for the last 2 days and has been injectingfor the past many weeks. He smokes 1 pack/day for the last 25 years. Denies any recent alcohol use. At the time of admission, the patient  was noted to have serum glucose of 452 with an anion gap of 25 and ketonuria. He was started on intravenous insulin and IV fluids. GI was consulted to assist with management of his hematemesis.    Hospital Course:  DKA, type 1 -patient started on IV insulin with q 1 hour CBG checks, q 4 hour BMPs, aggressive fluid resuscitation -HbA1C--12.2 on 05/05/17 - transitioned to s/c insulin- cont Lantus 30 U QHS and a moderate dose SS Novolog  Hematemesis/Intractible vomiting -likelymultifactorial inculdingMallory-Weiss tear, NSAID inducedPUD(pt uses naproxen regularly), exacerbation of underlying gastroparesis, and gastritis/esophagitis -Hemoglobinremains stable--mild drop due to dilution - evaluated by Dr Darrick Penna  - EGD> erosive esophagitis, gastritis and duodenitis -  Protonix drip>>>protonix q 12hrs for 3 mo and then daily - bland, low fat diet - NOTE: abdominal ultrasound unrevealing  Hep C antibody positive HCV RNA Qnt(log copy/mL) log10 IU/mL 6.394 VC  HepC Qn IU/mL 2,480,000   Management per correctional facility   Cocaine abuse/tobacco abuse -Cessation discussed -cont NicoDerm patch  Oropharyngeal candidiasis - cont mycelex troches   AKI -Secondary to volume depletion -resolved with IV fluids   Leukocytosis -Likely stress demargination -Monitor off antibiotics -Urinalysis negative for pyuria -improved  Hypokalemia -Repleted -  magnesium--2.0  Hyponatremia -Secondary to volume depletion and pseudohyponatremia from hyperglycemia  Thrombocytopenia -Likely due to chronic liver disease from hepatitis [facility] meds (seroquel) -HIV--neg -Serum B12--716 -Hepatitis B surface antigen--neg -Hepatitis C antibody--positive -RUQ US--liver looked normal    Discharge Instructions  Discharge Instructions    Diet general   Complete by:  As directed    Bland diet, low acidity, low carb, low sodium, heart  healthy   Increase activity slowly   Complete by:  As  directed      Allergies as of 06/11/2017   No Known Allergies     Medication List    STOP taking these medications   naproxen 500 MG tablet Commonly known as:  NAPROSYN     TAKE these medications   acetaminophen 325 MG tablet Commonly known as:  TYLENOL Take 2 tablets (650 mg total) every 6 (six) hours as needed by mouth for mild pain or fever.   clotrimazole 10 MG troche Commonly known as:  MYCELEX Take 1 tablet (10 mg total) 5 (five) times daily by mouth.   dicyclomine 20 MG tablet Commonly known as:  BENTYL Take 1 tablet (20 mg total) by mouth 4 (four) times daily -  before meals and at bedtime. For IBS symptoms.   FLUoxetine 20 MG capsule Commonly known as:  PROZAC Take 1 capsule (20 mg total) by mouth daily. For depression   gabapentin 100 MG capsule Commonly known as:  NEURONTIN Take 1 capsule (100 mg total) by mouth 3 (three) times daily. For agitation   hydrOXYzine 25 MG tablet Commonly known as:  ATARAX/VISTARIL Take 1 tablet (25 mg total) by mouth every 6 (six) hours as needed for anxiety.   insulin aspart 100 UNIT/ML injection Commonly known as:  novoLOG Inject 0-15 Units 3 (three) times daily with meals into the skin. CBG 70 - 120: 0 units  CBG 121 - 150: 2 units  CBG 151 - 200: 3 units  CBG 201 - 250: 5 units  CBG 251 - 300: 8 units  CBG 301 - 350: 11 units  CBG 351 - 400: 15 units   insulin aspart 100 UNIT/ML injection Commonly known as:  novoLOG Inject 0-5 Units at bedtime into the skin. CBG 70 - 120: 0 units  CBG 121 - 150: 0 units  CBG 151 - 200: 0 units  CBG 201 - 250: 2 units  CBG 251 - 300: 3 units  CBG 301 - 350: 4 units  CBG 351 - 400: 5 units   insulin glargine 100 UNIT/ML injection Commonly known as:  LANTUS Inject 0.3 mLs (30 Units total) at bedtime into the skin. What changed:    how much to take  when to take this  additional instructions   lisinopril 40 MG tablet Commonly known as:  PRINIVIL,ZESTRIL Take 1 tablet  daily by mouth.   nicotine 21 mg/24hr patch Commonly known as:  NICODERM CQ - dosed in mg/24 hours Place 1 patch (21 mg total) onto the skin daily. (May buy from over the counter): For smoking cessation   pantoprazole 40 MG tablet Commonly known as:  PROTONIX Take 1 tablet (40 mg total) 2 (two) times daily before a meal by mouth.   QUEtiapine 25 MG tablet Commonly known as:  SEROQUEL Take 1 tablet (25 mg total) by mouth at bedtime. For mood control       No Known Allergies   Procedures/Studies: EGD 11/13  [place] Abdomen Limited Ruq  Result Date: 06/09/2017 CLINICAL DATA:  Hepatitis-C with hepatic cirrhosis EXAM: ULTRASOUND ABDOMEN LIMITED RIGHT UPPER QUADRANT COMPARISON:  CT abdomen and pelvis May 29, 2017 FINDINGS: Gallbladder: No gallstones or wall thickening visualized. There is no appreciable pericholecystic fluid. No sonographic Murphy sign noted by sonographer. Common bile duct: Diameter: 4 mm. There is no intrahepatic or extrahepatic biliary duct dilatation. Liver: No focal lesion identified. Within normal limits in parenchymal echogenicity. Portal vein is patent  on color Doppler imaging with normal direction of blood flow towards the liver. IMPRESSION: Study within normal limits. Electronically Signed   By: Bretta Bang III M.D.   On: 06/09/2017 11:19       Discharge Exam: Vitals:   06/10/17 2121 06/11/17 0335  BP: 120/75 130/82  Pulse: (!) 52 (!) 52  Resp: 15 18  Temp: 98.1 F (36.7 C) (!) 97.5 F (36.4 C)  SpO2: 100% 98%   Vitals:   06/10/17 1614 06/10/17 1948 06/10/17 2121 06/11/17 0335  BP: 124/75  120/75 130/82  Pulse: (!) 57  (!) 52 (!) 52  Resp: 16  15 18   Temp: 97.9 F (36.6 C)  98.1 F (36.7 C) (!) 97.5 F (36.4 C)  TempSrc: Oral  Oral Oral  SpO2: 97% 96% 100% 98%  Weight:      Height:        General: Pt is alert, awake, not in acute distress Cardiovascular: RRR, S1/S2 +, no rubs, no gallops Respiratory: CTA bilaterally, no wheezing, no  rhonchi Abdominal: Soft, NT, ND, bowel sounds + Extremities: no edema, no cyanosis    The results of significant diagnostics from this hospitalization (including imaging, microbiology, ancillary and laboratory) are listed below for reference.     Microbiology: Recent Results (from the past 240 hour(s))  MRSA PCR Screening     Status: None   Collection Time: 06/06/17  6:28 AM  Result Value Ref Range Status   MRSA by PCR NEGATIVE NEGATIVE Final    Comment:        The GeneXpert MRSA Assay (FDA approved for NASAL specimens only), is one component of a comprehensive MRSA colonization surveillance program. It is not intended to diagnose MRSA infection nor to guide or monitor treatment for MRSA infections.      Labs: BNP (last 3 results) No results for input(s): BNP in the last 8760 hours. Basic Metabolic Panel: Recent Labs  Lab 06/06/17 0531 06/06/17 0723 06/07/17 0419 06/08/17 0719 06/09/17 0530  NA 129* 130* 134* 133* 135  K 3.3* 3.2* 3.6 3.6 3.7  CL 90* 91* 96* 97* 98*  CO2 24 26 28 31 30   GLUCOSE 272* 195* 223* 348* 241*  BUN 24* 24* 15 9 7   CREATININE 0.87 0.79 0.56* 0.54* 0.49*  CALCIUM 8.3* 8.3* 8.0* 8.4* 8.3*  MG  --   --  2.0  --   --    Liver Function Tests: Recent Labs  Lab 06/07/17 0841 06/09/17 1139  AST 16 20  ALT 20 18  ALKPHOS 55 57  BILITOT 1.6* 0.5  PROT 6.0* 6.0*  ALBUMIN 3.1* 3.3*   No results for input(s): LIPASE, AMYLASE in the last 168 hours. No results for input(s): AMMONIA in the last 168 hours. CBC: Recent Labs  Lab 06/06/17 0122 06/06/17 1208 06/07/17 0419 06/08/17 0719 06/09/17 0530  WBC 18.5*  --  8.9 5.7 5.5  HGB 17.1* 16.2 15.8 16.0 15.0  HCT 47.3 45.6 45.6 46.6 43.6  MCV 82.7  --  82.3 84.6 84.3  PLT 141*  --  103* 78* 76*   Cardiac Enzymes: No results for input(s): CKTOTAL, CKMB, CKMBINDEX, TROPONINI in the last 168 hours. BNP: Invalid input(s): POCBNP CBG: Recent Labs  Lab 06/10/17 1359 06/10/17 1620  06/10/17 2115 06/11/17 0806 06/11/17 1153  GLUCAP 136* 89 281* 191* 234*   D-Dimer No results for input(s): DDIMER in the last 72 hours. Hgb A1c No results for input(s): HGBA1C in the last 72 hours. Lipid Profile  No results for input(s): CHOL, HDL, LDLCALC, TRIG, CHOLHDL, LDLDIRECT in the last 72 hours. Thyroid function studies No results for input(s): TSH, T4TOTAL, T3FREE, THYROIDAB in the last 72 hours.  Invalid input(s): FREET3 Anemia work up No results for input(s): VITAMINB12, FOLATE, FERRITIN, TIBC, IRON, RETICCTPCT in the last 72 hours. Urinalysis    Component Value Date/Time   COLORURINE STRAW (A) 06/06/2017 0235   APPEARANCEUR CLEAR 06/06/2017 0235   LABSPEC 1.025 06/06/2017 0235   PHURINE 5.0 06/06/2017 0235   GLUCOSEU >=500 (A) 06/06/2017 0235   HGBUR MODERATE (A) 06/06/2017 0235   BILIRUBINUR NEGATIVE 06/06/2017 0235   KETONESUR 80 (A) 06/06/2017 0235   PROTEINUR NEGATIVE 06/06/2017 0235   UROBILINOGEN 0.2 05/14/2014 1645   NITRITE NEGATIVE 06/06/2017 0235   LEUKOCYTESUR NEGATIVE 06/06/2017 0235   Sepsis Labs Invalid input(s): PROCALCITONIN,  WBC,  LACTICIDVEN Microbiology Recent Results (from the past 240 hour(s))  MRSA PCR Screening     Status: None   Collection Time: 06/06/17  6:28 AM  Result Value Ref Range Status   MRSA by PCR NEGATIVE NEGATIVE Final    Comment:        The GeneXpert MRSA Assay (FDA approved for NASAL specimens only), is one component of a comprehensive MRSA colonization surveillance program. It is not intended to diagnose MRSA infection nor to guide or monitor treatment for MRSA infections.      Time coordinating discharge: Over 30 minutes  SIGNED:   Calvert Cantor, MD  [facility] 06/11/2017, 1:04 PM Pager   If 7PM-7AM, please contact night-coverage [email or web address] Password [email or web address]

## 2017-06-11 NOTE — Care Management Note (Signed)
Case Management Note  Patient Details  Name: CAMEN KETCH MRN: [id number] Date of Birth: Oct 01, 1975  Expected Discharge Date:  06/11/17               Expected Discharge Plan:  [facility]  Discharge planning Services  CM Consult, Medication Assistance  Status of Service:  Completed, signed off  Additional Comments: DC back to [facility] today. No CM needs noted at DC. [facility] jail.   Malcolm Metro, RN 06/11/2017, 2:34 PM

## 2017-06-13 ENCOUNTER — Encounter (HOSPITAL_COMMUNITY): Payer: Self-pay | Admitting: Gastroenterology

## 2017-06-13 ENCOUNTER — Telehealth: Payer: Self-pay | Admitting: Gastroenterology

## 2017-06-13 NOTE — Telephone Encounter (Signed)
PLEASE CALL PT'S FACILITY. He has H. Pylori gastritis. HE TAKES SEROQUEL SO HE NEEDS and so she needs PYLERA 3 PILLS QID FOR 10 DAYS. He should take PROTONIX BID for 10 days then once daily. The meds can cause nausea, vomiting, abd cramps, loose stools, black colored stools, and metallic taste in her mouth.  HE SHOUlD HAVE AN H PYLORI BREATH TEST IN 6 MO TO CONFIRM Eradication.

## 2017-06-16 NOTE — Telephone Encounter (Signed)
LMOM to call.

## 2017-06-17 NOTE — Telephone Encounter (Signed)
Reminder in epic °

## 2017-06-18 NOTE — Telephone Encounter (Signed)
Per Dr. Darrick Penna, pt is incarcerated in [place]. She said Ok to contact nurse on Monday to take care of this.

## 2017-06-18 NOTE — Telephone Encounter (Signed)
LMOM to call.

## 2017-06-23 NOTE — Telephone Encounter (Signed)
FYI to Dr. Darrick Penna and [place].

## 2017-06-23 NOTE — Telephone Encounter (Signed)
Pt is not at [facility] or the [facility], whom I was told at [place] to check there.

## 2017-06-23 NOTE — Telephone Encounter (Signed)
Send letter to most recent address.

## 2017-06-23 NOTE — Telephone Encounter (Signed)
Letter mailed

## 2017-06-23 NOTE — Telephone Encounter (Signed)
Reviewed

## 2017-10-21 ENCOUNTER — Telehealth: Payer: Self-pay | Admitting: Gastroenterology

## 2017-10-21 NOTE — Telephone Encounter (Signed)
H PYLORI TEST TO BE DONE

## 2017-10-22 ENCOUNTER — Other Ambulatory Visit: Payer: Self-pay

## 2017-10-22 DIAGNOSIS — A048 Other specified bacterial intestinal infections: Secondary | ICD-10-CM

## 2017-10-22 NOTE — Telephone Encounter (Signed)
Lab order on file for 12/19/2017.

## 2017-10-31 ENCOUNTER — Other Ambulatory Visit: Payer: Self-pay

## 2017-10-31 DIAGNOSIS — A048 Other specified bacterial intestinal infections: Secondary | ICD-10-CM

## 2017-11-11 ENCOUNTER — Telehealth: Payer: Self-pay

## 2017-11-11 NOTE — Telephone Encounter (Signed)
Lab orders returned, not deliverable to [address] [place].

## 2018-09-30 IMAGING — CT CT ANGIO CHEST
3 of 7 series · 19 of 46 positions shown · IV contrast (Isovue)
Comparison: Chest radiography same day.  Chest CT 10/17/2010.

CLINICAL DATA: Chest pain beginning 7 hours ago. Shortness of
breath. Chest pressure. Diaphoresis.

EXAM:
CT ANGIOGRAPHY CHEST WITH CONTRAST
TECHNIQUE: Multidetector CT imaging of the chest was performed using the
standard protocol during bolus administration of intravenous
contrast. Multiplanar CT image reconstructions and MIPs were
obtained to evaluate the vascular anatomy.
CONTRAST:  100 cc Isovue 370

[Series 4: axial arterial · axial · arterial · 0.84mm/px · z∈[+1001,+1307]mm · 12 of 122 slices shown]
[im 10/122  lung]
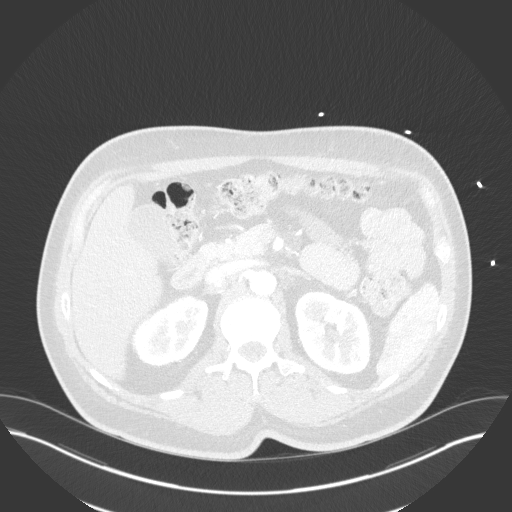
[im 19/122  soft-tissue]
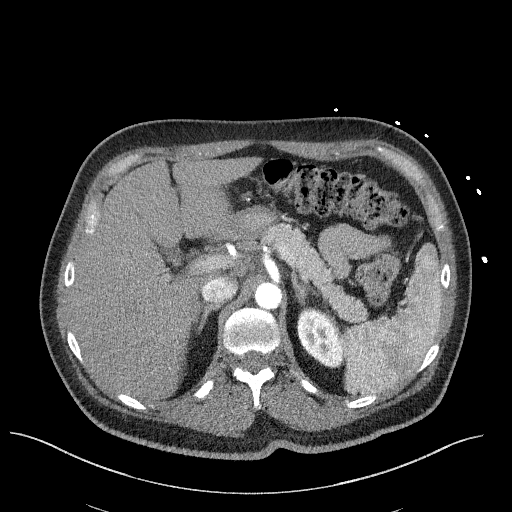
[im 28/122  lung]
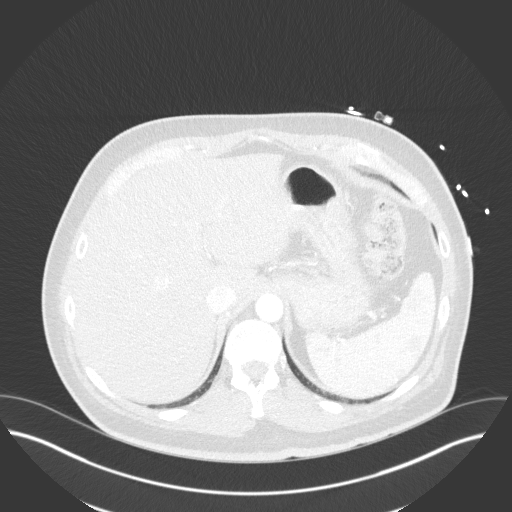
[im 38/122  soft-tissue]
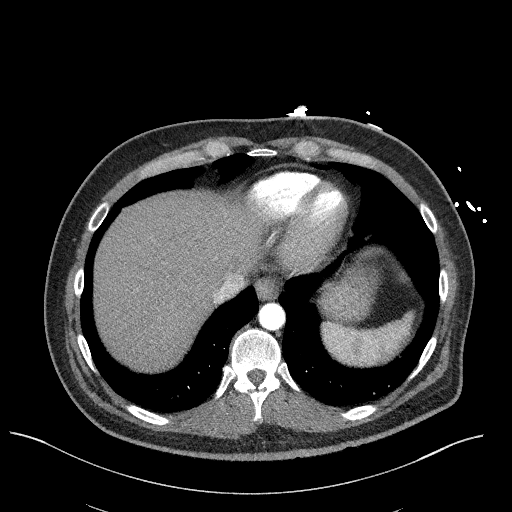
[im 47/122  lung]
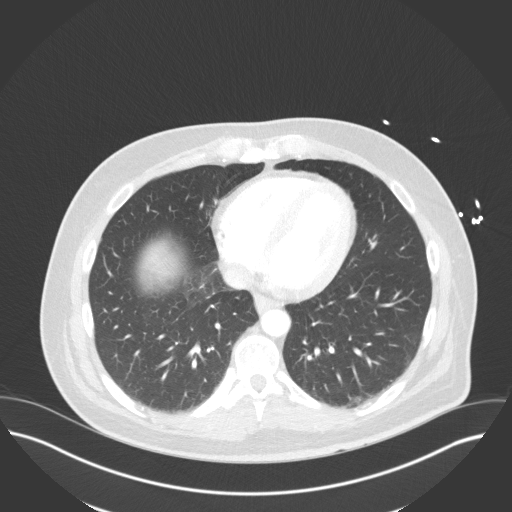
[im 56/122  soft-tissue]
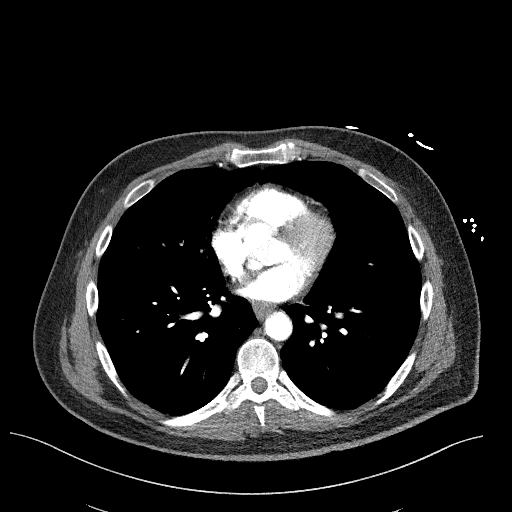
[im 66/122  lung]
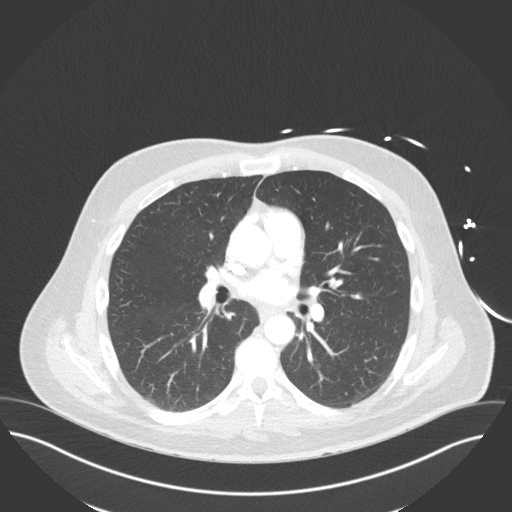
[im 75/122  soft-tissue]
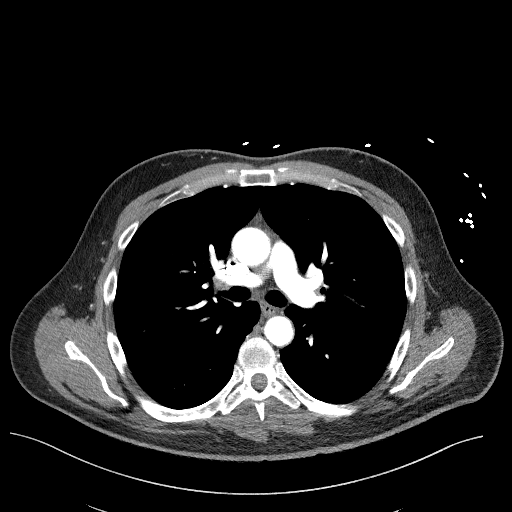
[im 84/122  lung]
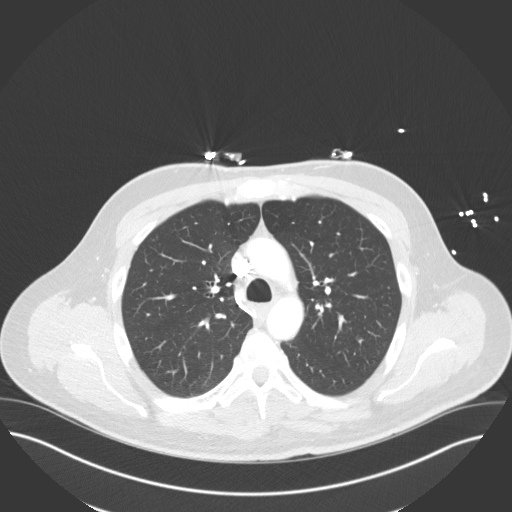
[im 94/122  soft-tissue]
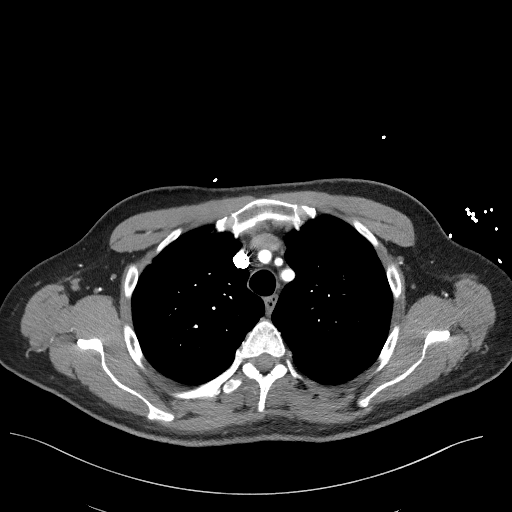
[im 103/122  lung]
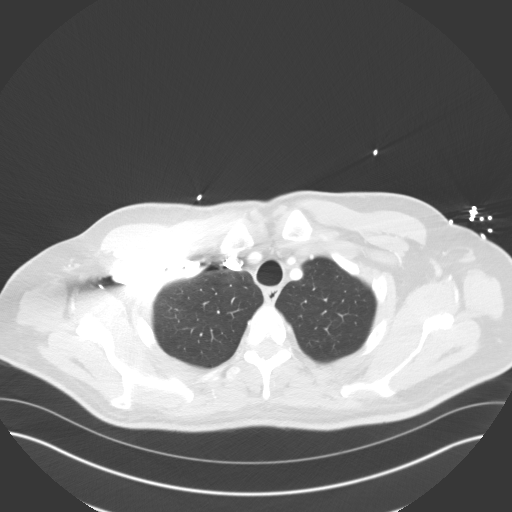
[im 112/122  soft-tissue]
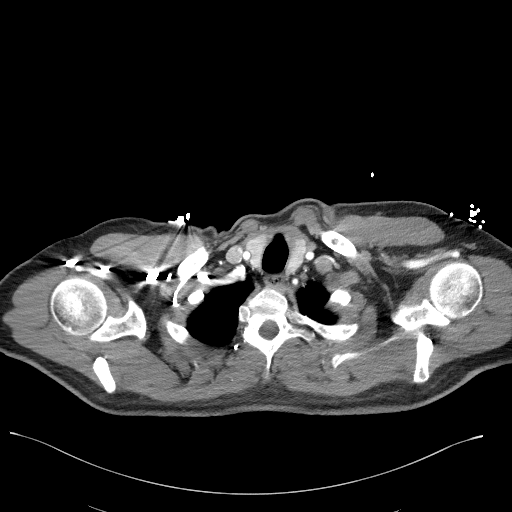

[Series 7: lung · axial · 0.84mm/px · z∈[+1022,+1157]mm · 4 of 73 slices shown]
[im 10/73  soft-tissue]
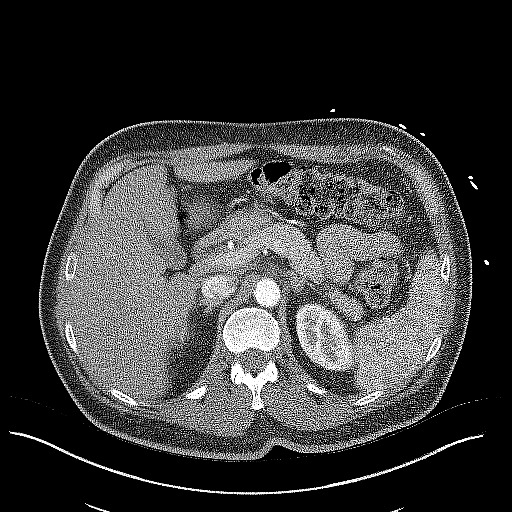
[im 19/73  soft-tissue]
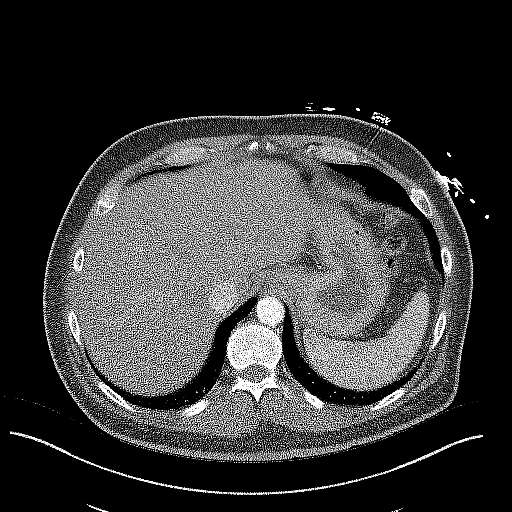
[im 28/73  soft-tissue]
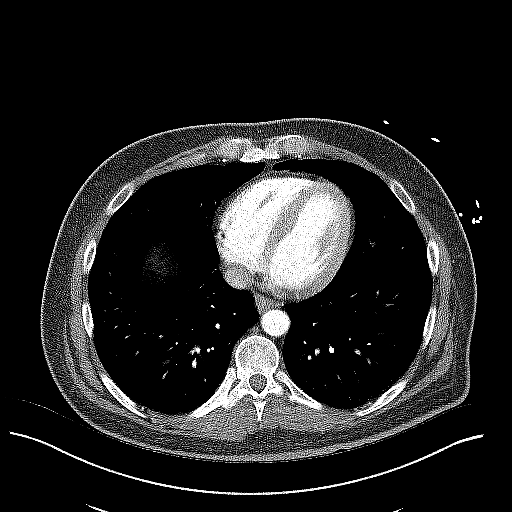
[im 37/73  soft-tissue]
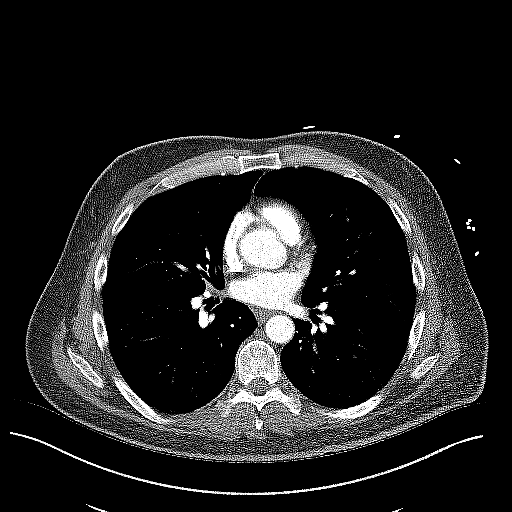

[Series 8: coronals · coronal · 0.73mm/px · 3 of 147 slices shown]
[im 37/147  soft-tissue]
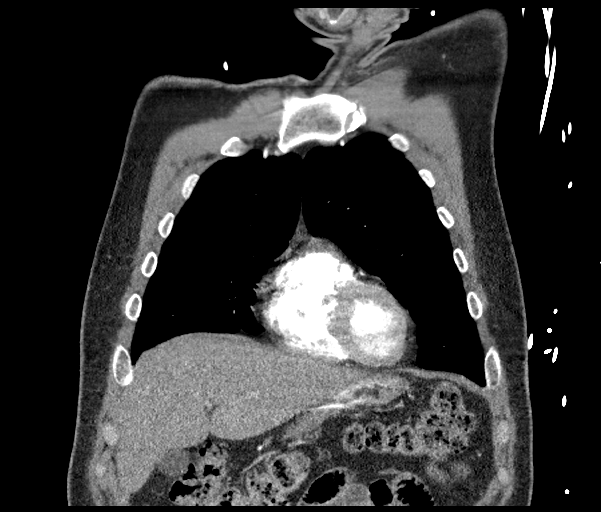
[im 74/147  soft-tissue]
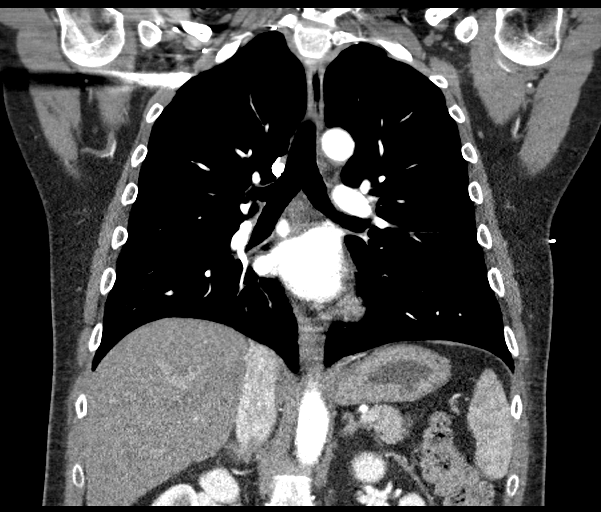
[im 110/147  soft-tissue]
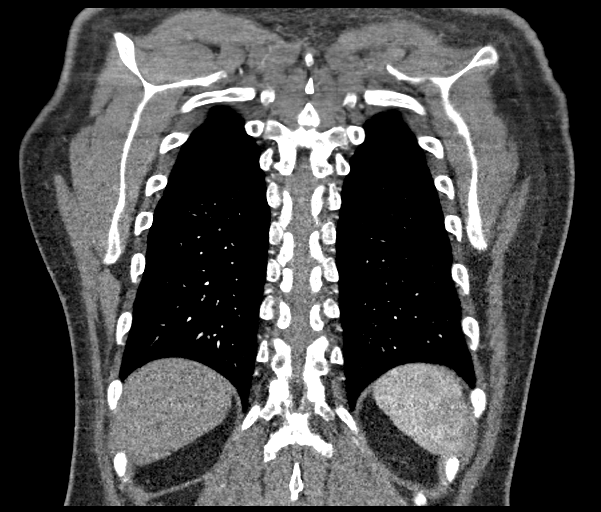

[19 of 46 positions shown; findings below may reference images not displayed]

FINDINGS: Cardiovascular: Mild atherosclerosis at the aortic arch. No aneurysm
or dissection. Focal calcification in the proximal right and left
coronary arteries. Heart size is normal. No pericardial fluid. No
evidence of pulmonary emboli.

Mediastinum/Nodes: No mass or adenopathy.

Lungs/Pleura: The lungs are clear.  No pleural effusion.

Upper Abdomen: Normal

Musculoskeletal: Old minor superior endplate depression at T4. No
acute or significant bone finding.

Review of the MIP images confirms the above findings.
IMPRESSION: Mild thoracic aortic atherosclerosis. No aneurysm or dissection.
Focal calcification at both proximal right and left coronary
arteries.

No pulmonary emboli.

No evidence of pleural or parenchymal disease.

## 2018-09-30 IMAGING — DX DG CHEST 2V
2 series · 2 of 2 positions shown · non-contrast
Comparison: 11/27/2016; 01/28/2015

CLINICAL DATA: Anterior chest pain radiating to the left arm and
back since this morning. History of hypertension and smoking.

EXAM:
CHEST  2 VIEW

[chest pa]
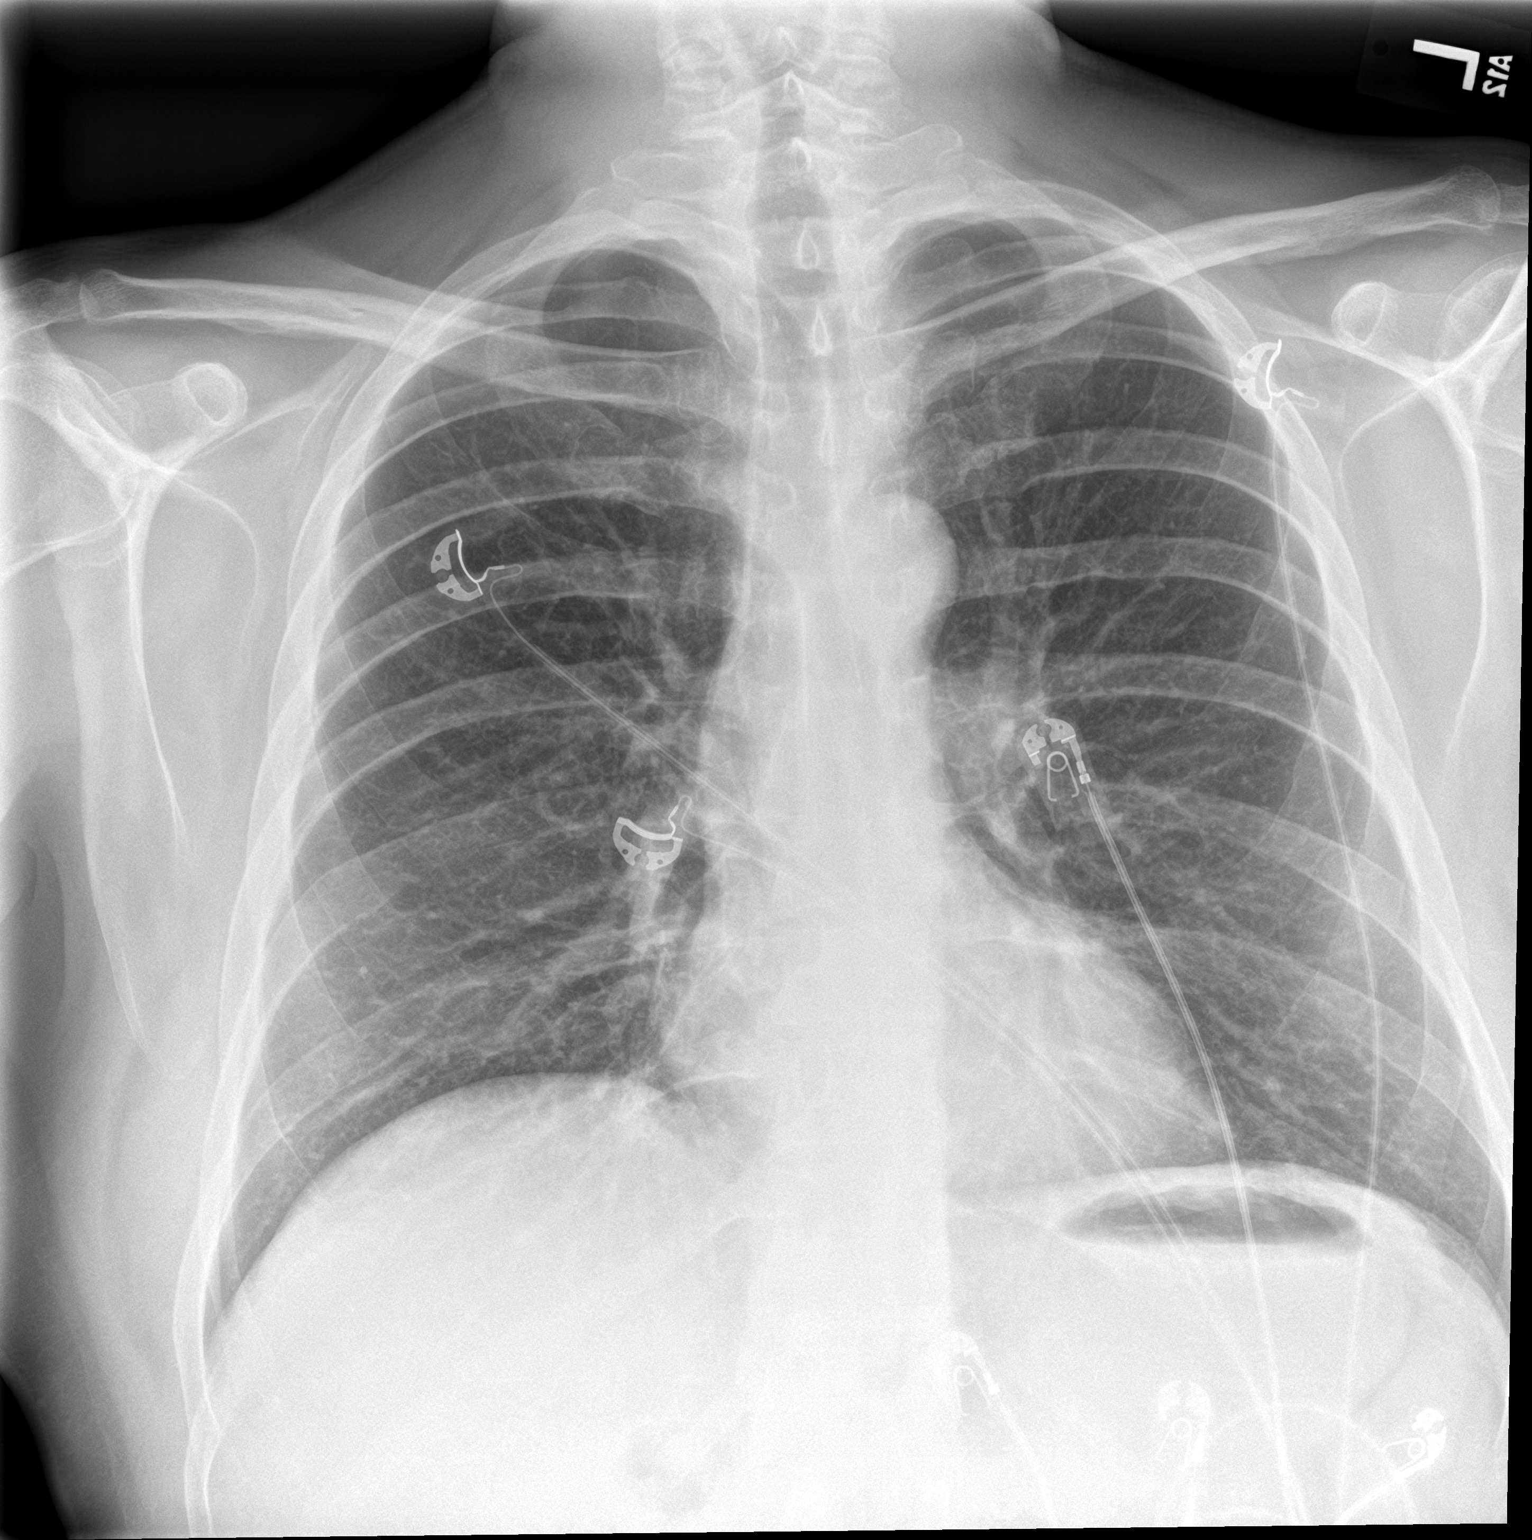

[chest lat]
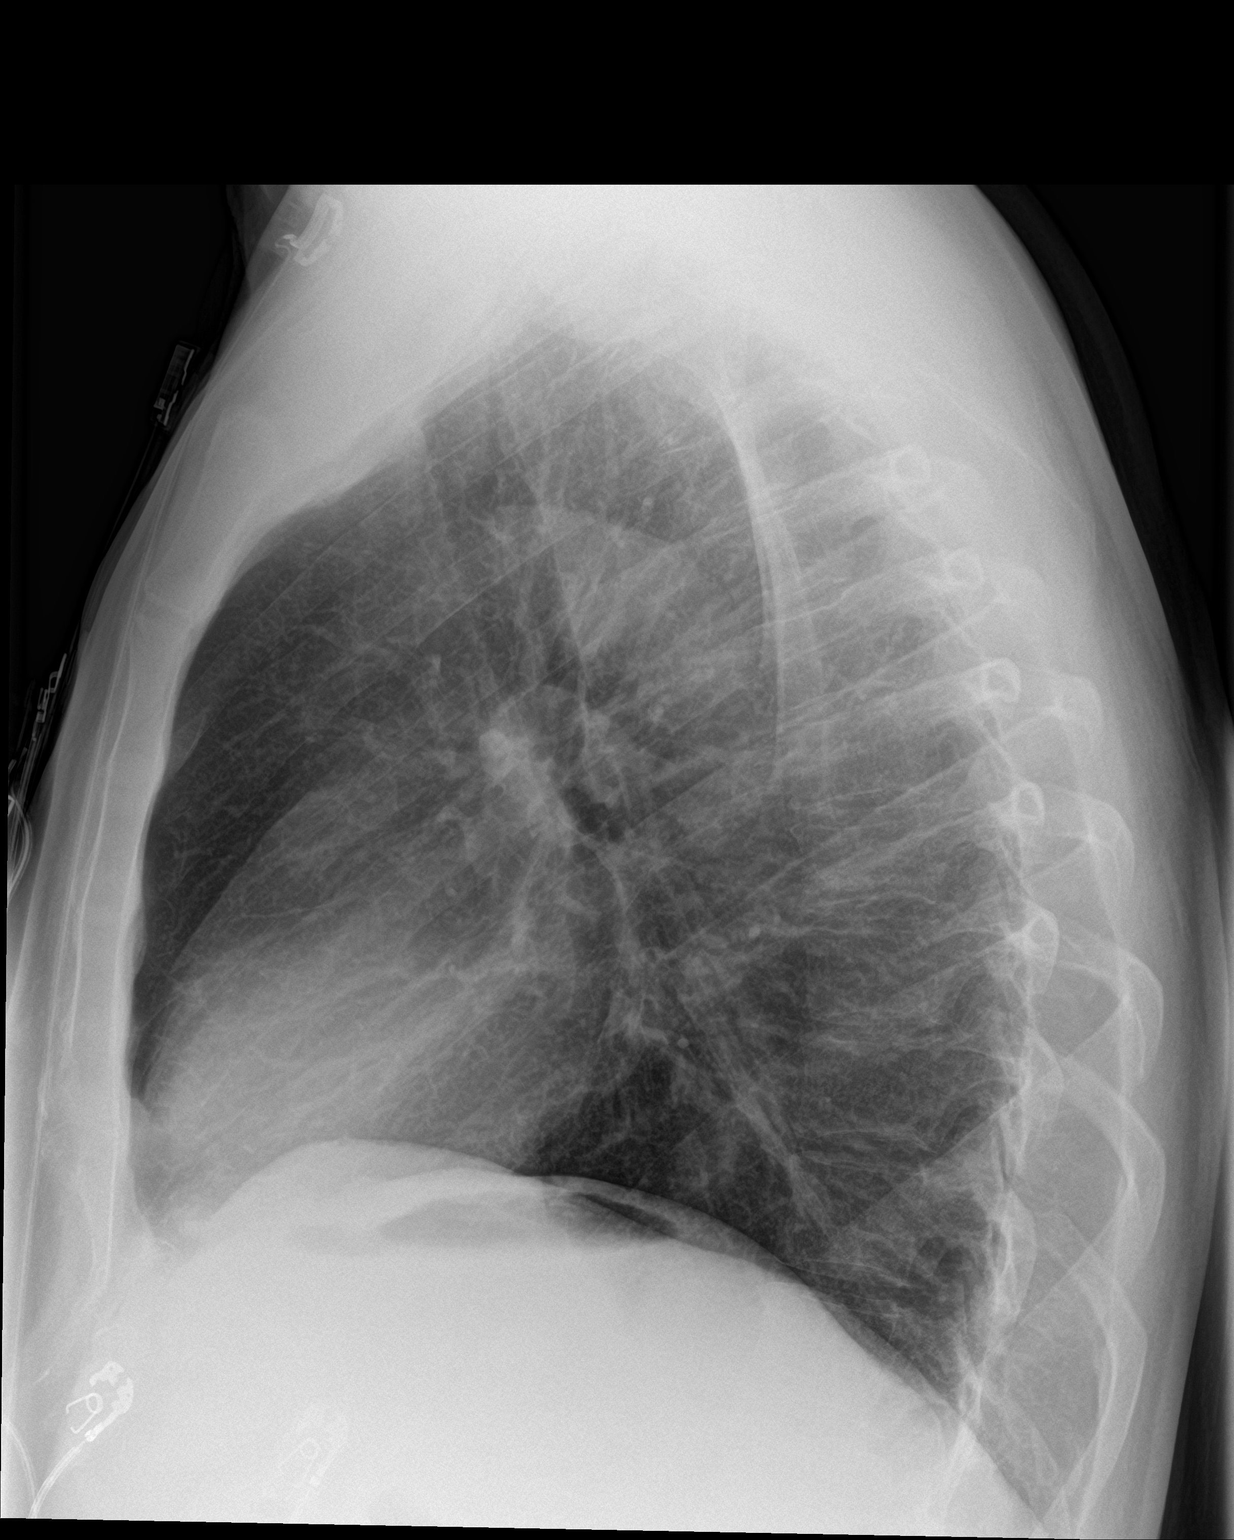

[2 of 2 positions shown; findings below may reference images not displayed]

FINDINGS: Grossly unchanged cardiac silhouette and mediastinal contours. The
lungs appear hyperexpanded with flattening of the diaphragms and
mild diffuse slightly nodular thickening of the pulmonary
interstitium. No focal parenchymal opacities. No pleural effusion or
pneumothorax. No evidence of edema. No acute osseus abnormalities.
IMPRESSION: Lung hyperexpansion and bronchitic change without superimposed acute
cardiopulmonary disease.
